# Patient Record
Sex: Female | Born: 1950
Health system: Southern US, Community
[De-identification: ages and names within clinical notes are randomized; demographics above are authoritative.]

## PROBLEM LIST (undated history)

## (undated) DIAGNOSIS — A09 Infectious gastroenteritis and colitis, unspecified: Secondary | ICD-10-CM

## (undated) DIAGNOSIS — G479 Sleep disorder, unspecified: Secondary | ICD-10-CM

## (undated) DIAGNOSIS — K644 Residual hemorrhoidal skin tags: Secondary | ICD-10-CM

## (undated) DIAGNOSIS — R1904 Left lower quadrant abdominal swelling, mass and lump: Secondary | ICD-10-CM

## (undated) DIAGNOSIS — M349 Systemic sclerosis, unspecified: Secondary | ICD-10-CM

## (undated) DIAGNOSIS — M7150 Other bursitis, not elsewhere classified, unspecified site: Secondary | ICD-10-CM

## (undated) DIAGNOSIS — R0789 Other chest pain: Secondary | ICD-10-CM

## (undated) DIAGNOSIS — R040 Epistaxis: Secondary | ICD-10-CM

## (undated) DIAGNOSIS — K429 Umbilical hernia without obstruction or gangrene: Secondary | ICD-10-CM

## (undated) DIAGNOSIS — D249 Benign neoplasm of unspecified breast: Secondary | ICD-10-CM

## (undated) HISTORY — DX: Sleep disorder, unspecified: G47.9

## (undated) HISTORY — DX: Left lower quadrant abdominal swelling, mass and lump: R19.04

## (undated) HISTORY — DX: Residual hemorrhoidal skin tags: K64.4

## (undated) HISTORY — DX: Benign neoplasm of unspecified breast: D24.9

## (undated) HISTORY — DX: Umbilical hernia without obstruction or gangrene: K42.9

## (undated) HISTORY — DX: Epistaxis: R04.0

## (undated) HISTORY — DX: Systemic sclerosis, unspecified: M34.9

## (undated) HISTORY — DX: Infectious gastroenteritis and colitis, unspecified: A09

## (undated) HISTORY — PX: COLONOSCOPY: SHX174

## (undated) HISTORY — PX: HYSTEROSCOPY: SHX211

## (undated) HISTORY — DX: Other bursitis, not elsewhere classified, unspecified site: M71.50

## (undated) HISTORY — DX: Other chest pain: R07.89

---

## 1986-11-20 HISTORY — PX: BREAST BIOPSY: SHX20

## 1986-11-20 HISTORY — PX: BREAST EXCISIONAL BIOPSY: SUR124

## 1993-11-20 HISTORY — PX: HYSTEROSCOPY: SHX211

## 1993-11-20 HISTORY — PX: BREAST BIOPSY: SHX20

## 1999-11-21 HISTORY — PX: DILATION AND CURETTAGE OF UTERUS: SHX78

## 2004-08-30 ENCOUNTER — Ambulatory Visit: Payer: Self-pay | Admitting: General Surgery

## 2004-08-30 IMAGING — MG MM CAD SCREENING MAMMO
1 series · 4 of 4 positions shown · non-contrast
Comparison: none

REASON FOR EXAM: SCREENING  [HOSPITAL] EMP CAT 2
COMMENTS:

[R MLO · right · 4 of 4 slices shown]
[im 1/4]
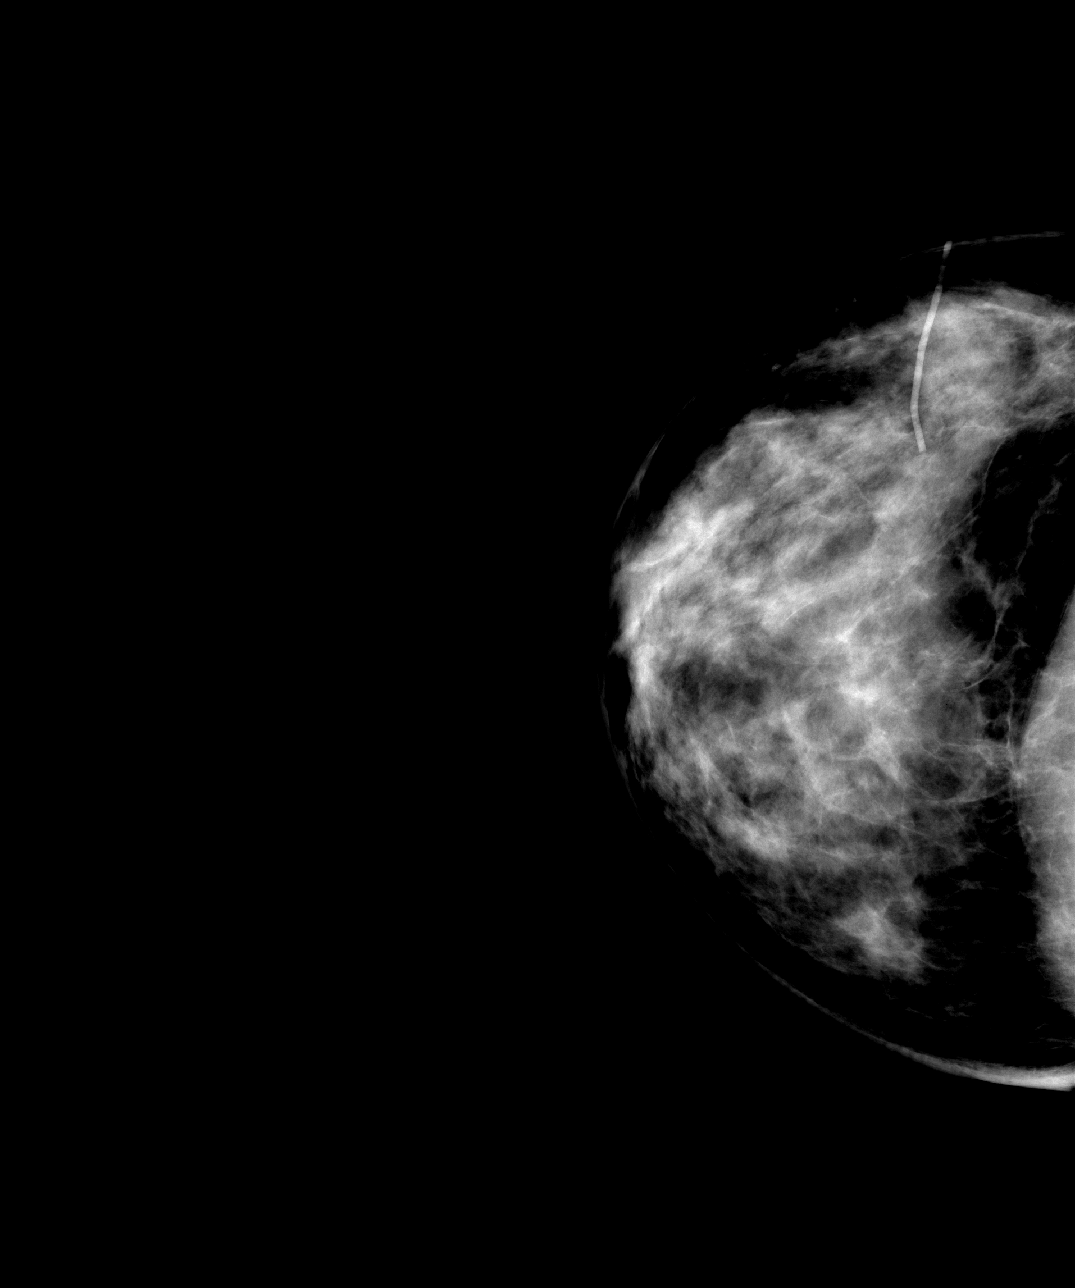
[im 2/4]
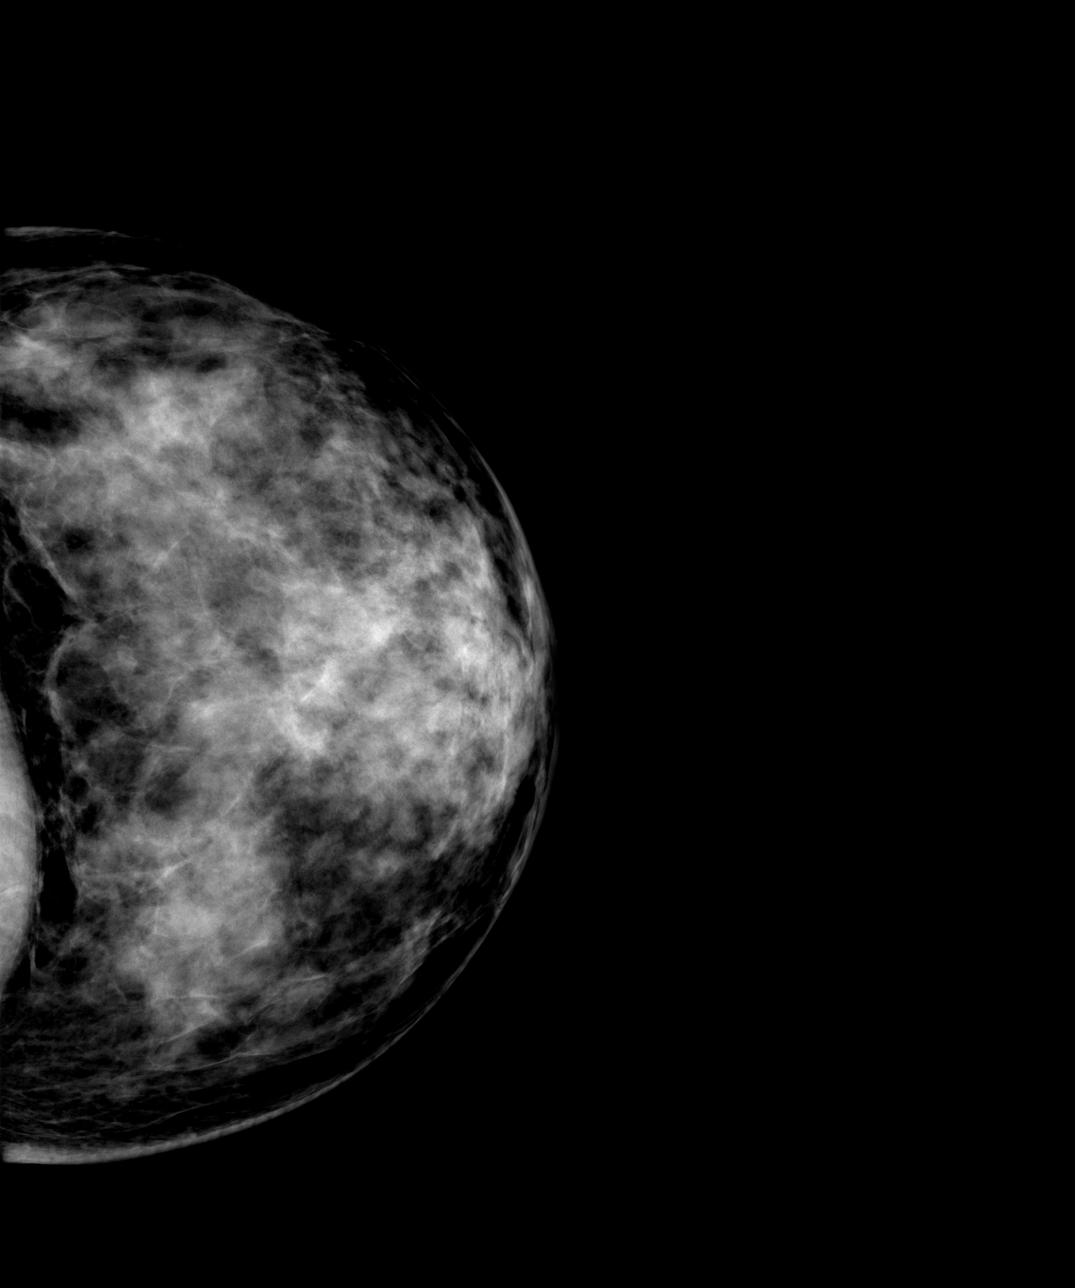
[im 3/4]
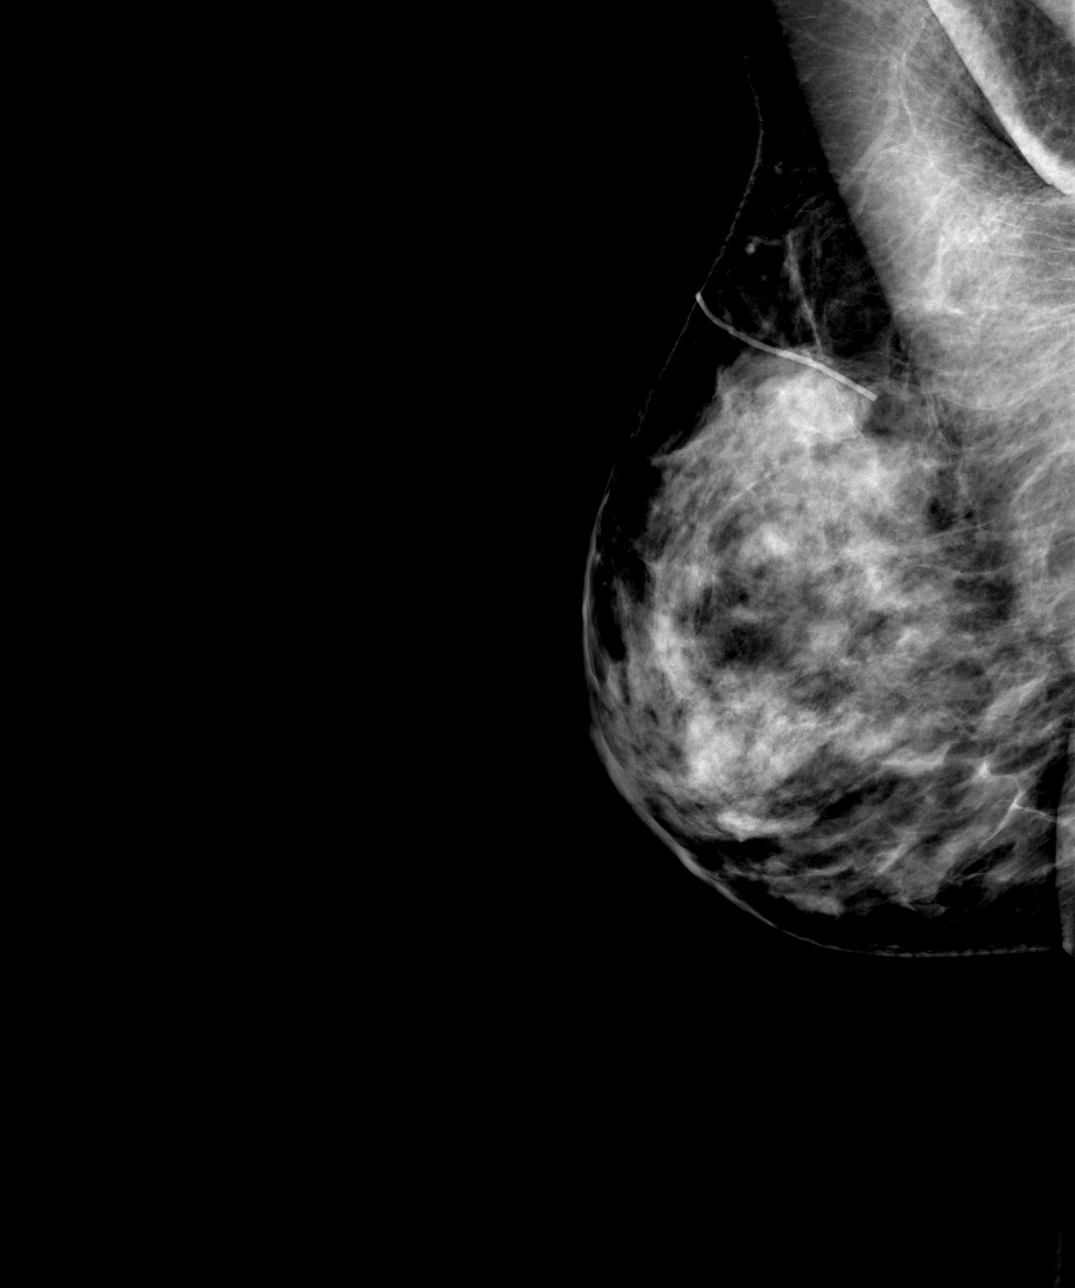
[im 4/4]
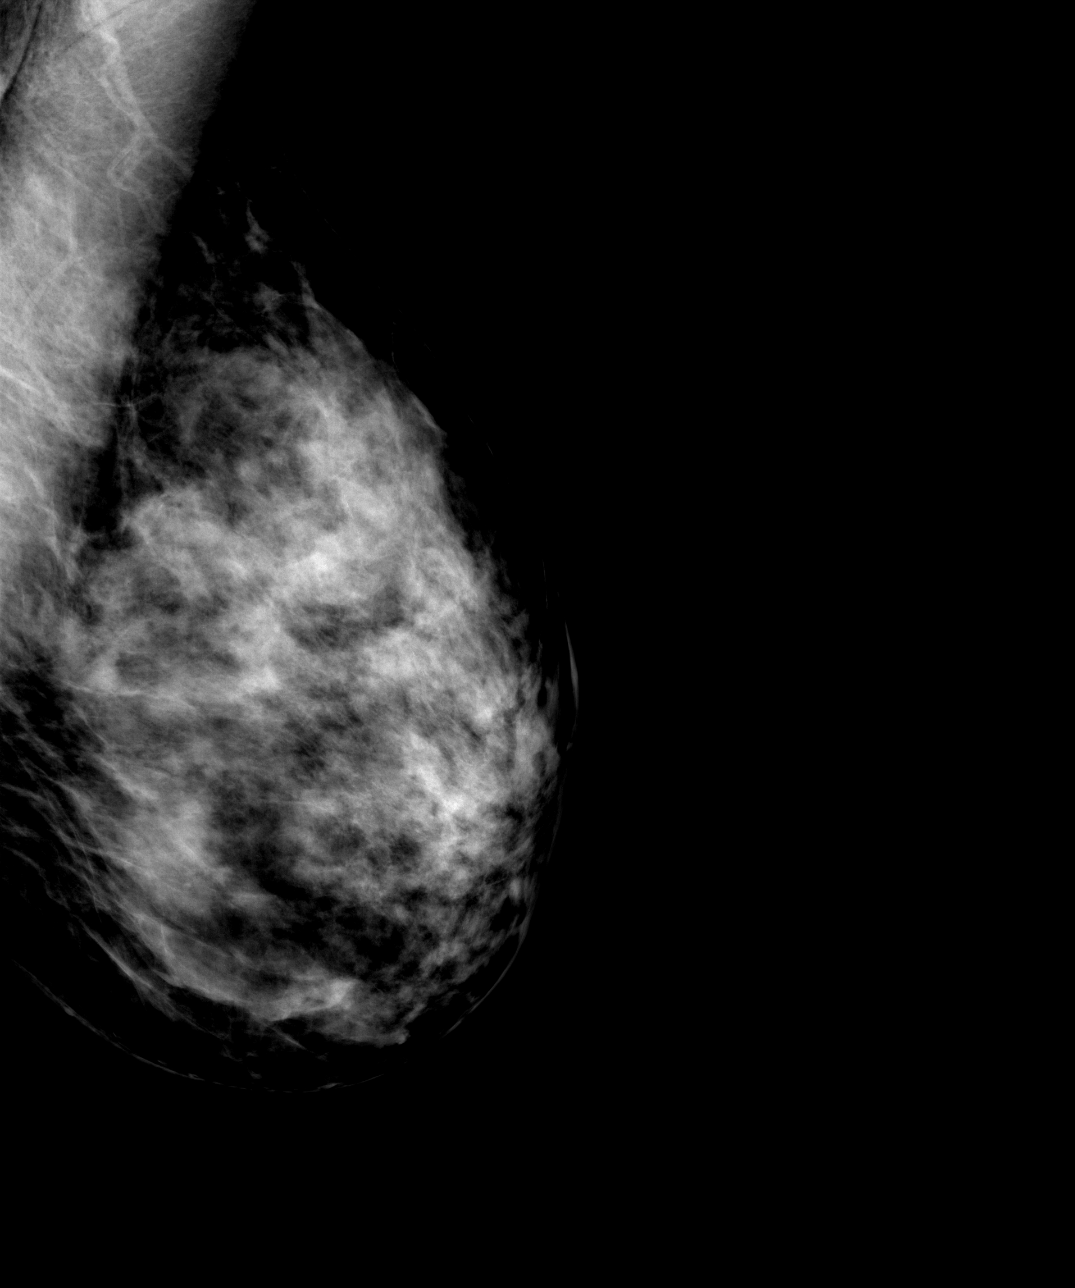

[4 of 4 positions shown; findings below may reference images not displayed]

PROCEDURE:     MAM - MAM DGTL SCREENING MAMMO W/CAD  - [DATE]  [DATE]

RESULT:     Comparison is made to the prior studies dated [DATE], [DATE],
and [DATE].  The breast demonstrates a dense fibronodular parenchymal
pattern which limits mammographic evaluation.  The region of asymmetric
density projects in the superolateral aspect of the RIGHT breast.  This is
likely a conglomeration of normal breast tissue though further evaluation
with magnification compression imaging is recommended.  Note this is in a
region of prior biopsy.  No further radiographic evidence to suggest
malignancy is appreciated.
IMPRESSION: Asymmetric density RIGHT breast.  Additional imaging recommended.

Corresponds to BI-RADS: Category 0 - Needs additional imaging evaluation.

A NEGATIVE MAMMOGRAM REPORT DOES NOT PRECLUDE BIOPSY OR OTHER EVALUATION OF
A CLINICALLY PALPABLE OR OTHERWISE SUSPICIOUS MASS OR LESION.  BREAST CANCER
MAY NOT BE DETECTED BY MAMMOGRAPHY IN UP TO 10% OF CASES.

## 2004-09-01 ENCOUNTER — Ambulatory Visit: Payer: Self-pay | Admitting: General Surgery

## 2004-09-01 IMAGING — MG MM ADDITIONAL VIEWS AT NO CHARGE
1 series · 4 of 4 positions shown · non-contrast
Comparison: none

REASON FOR EXAM: denisty IF ULS NEEDED TO NE DONE IN [REDACTED]
COMMENTS:

[R MLO · right · 4 of 4 slices shown]
[im 1/4]
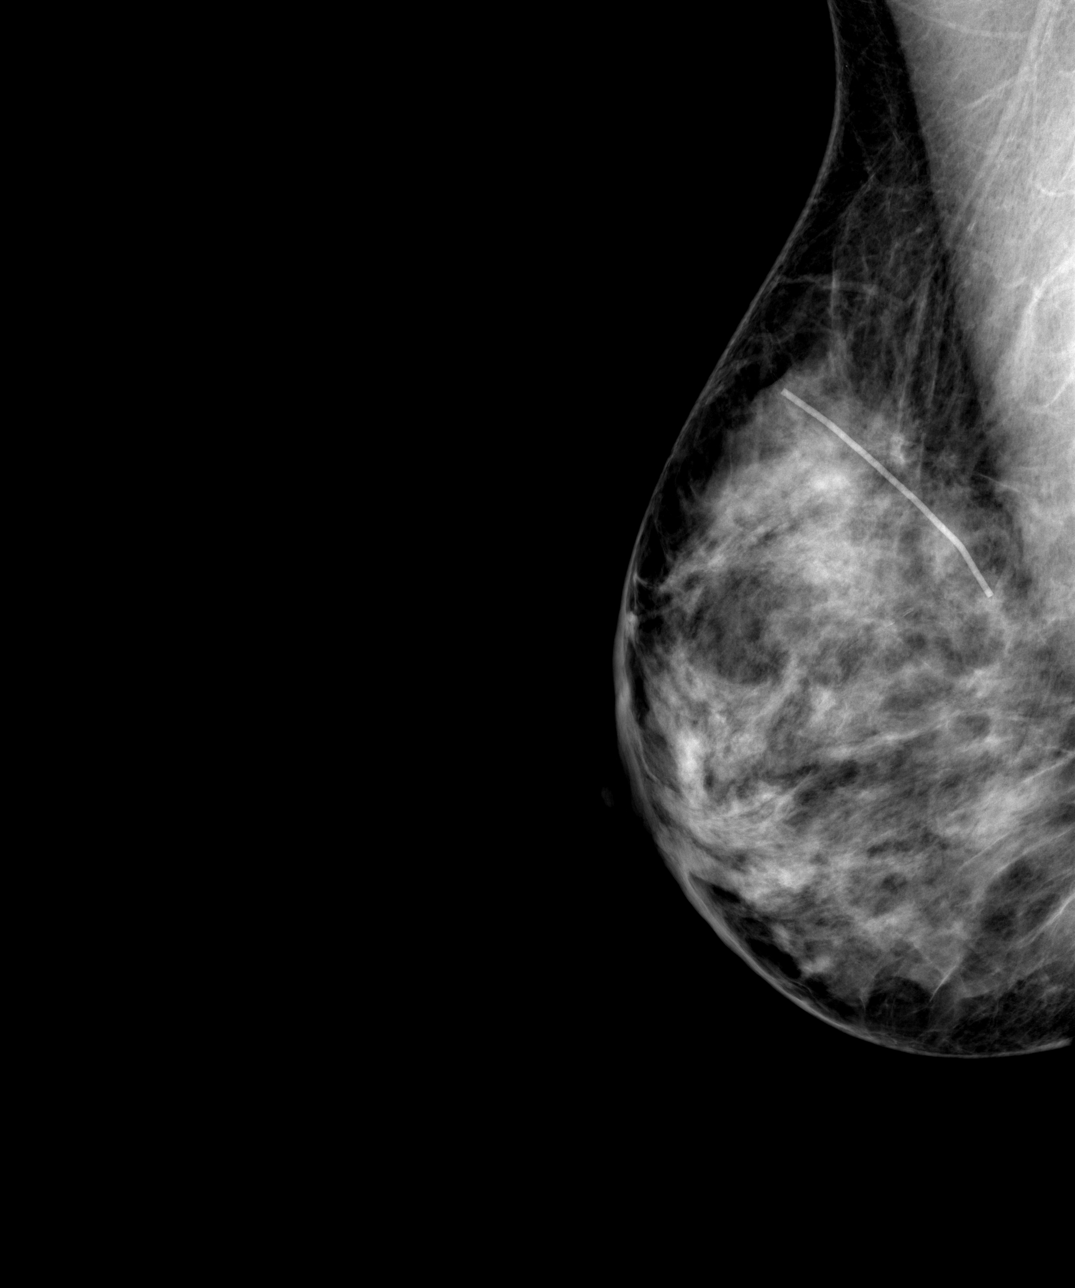
[im 2/4]
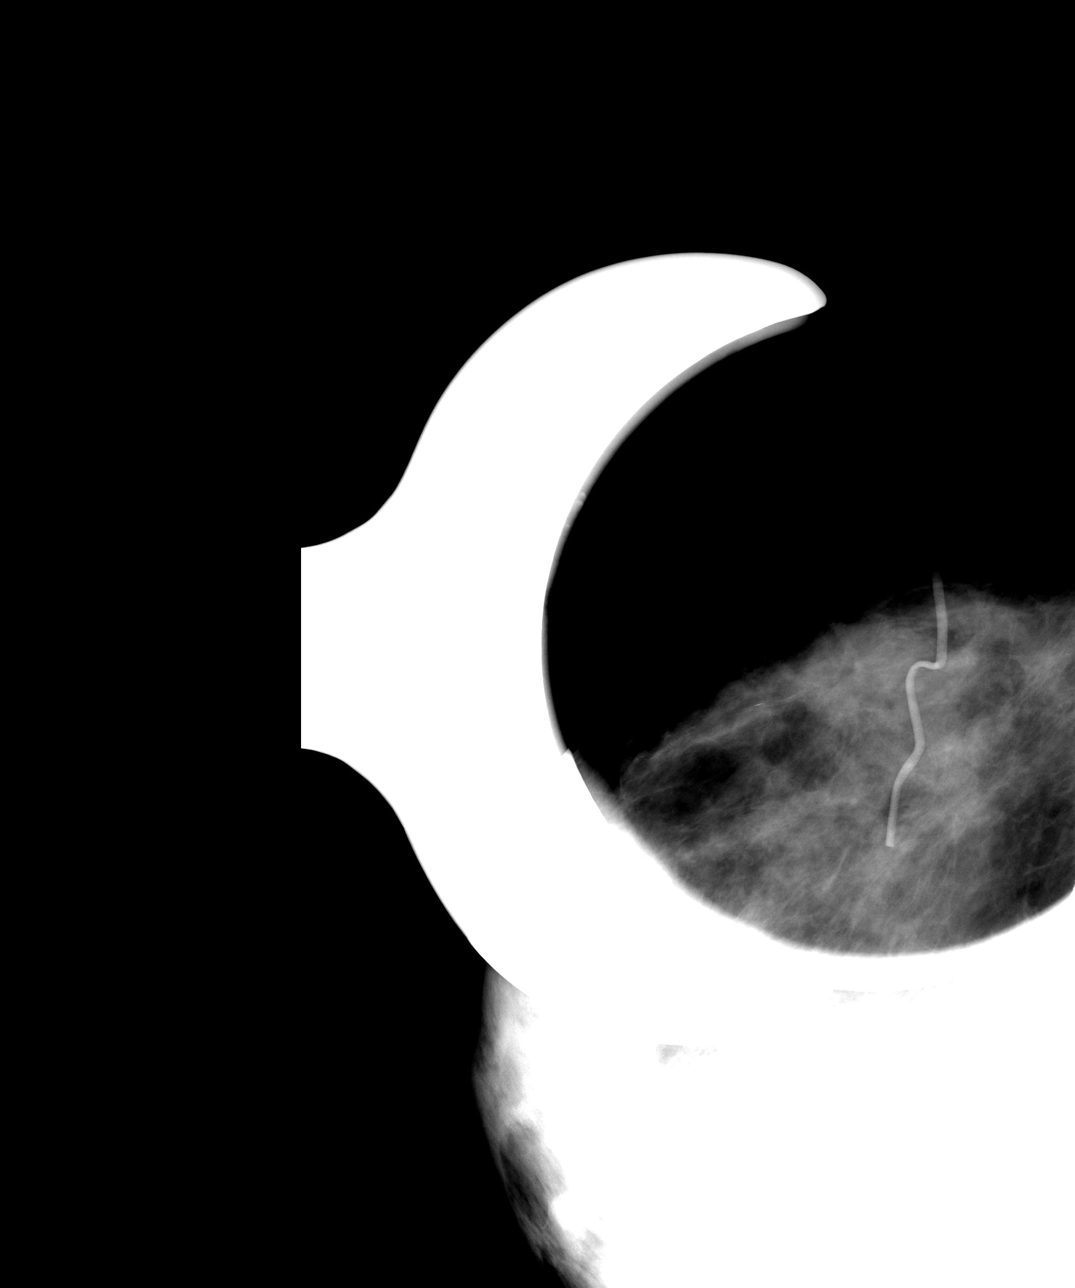
[im 3/4]
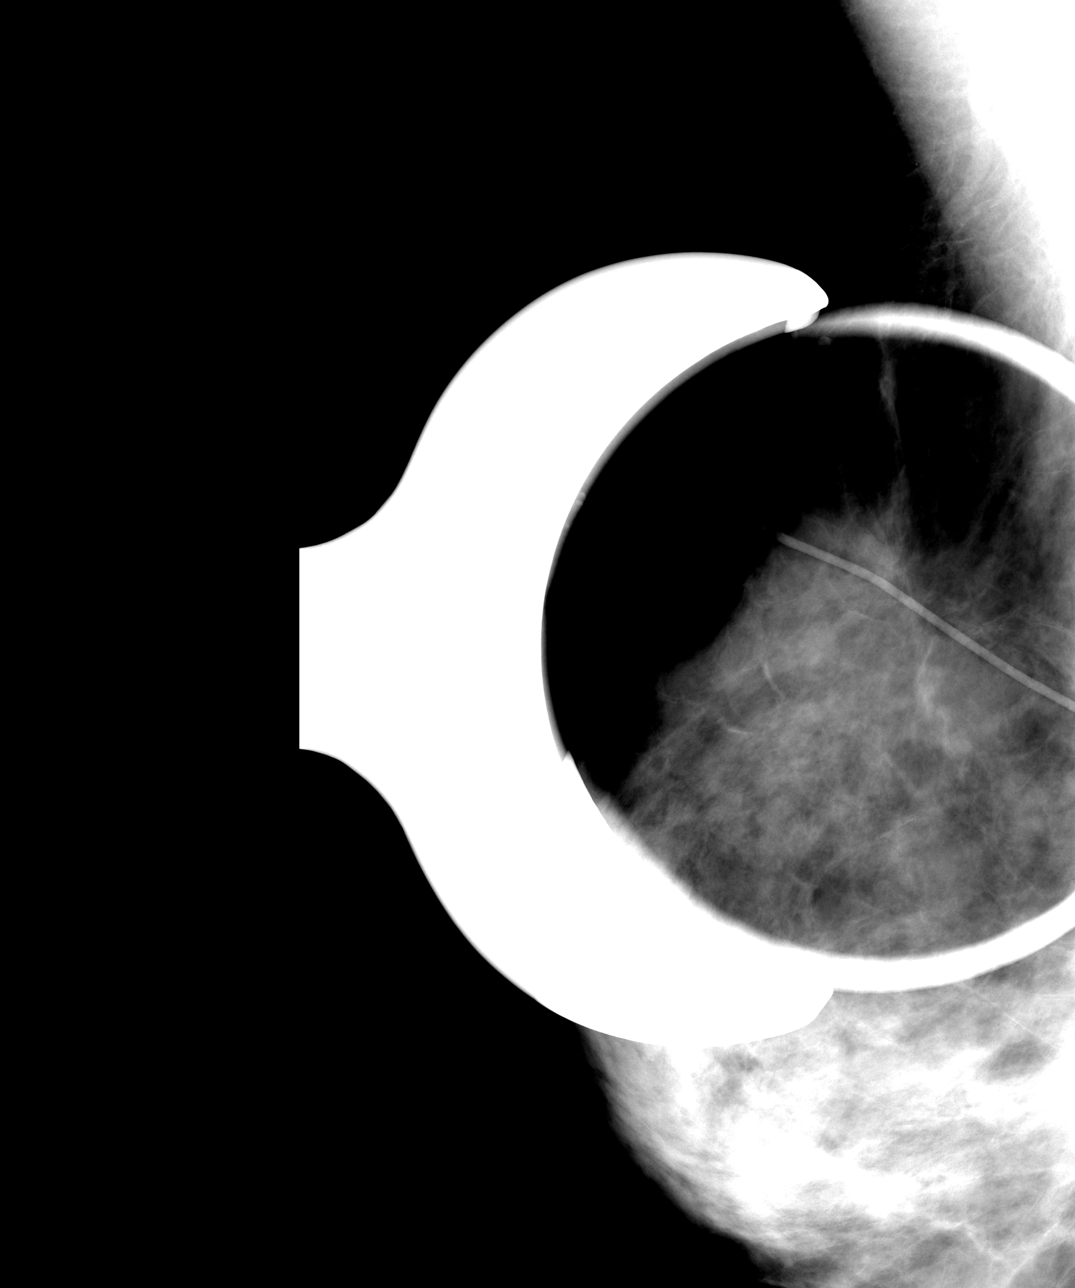
[im 4/4]
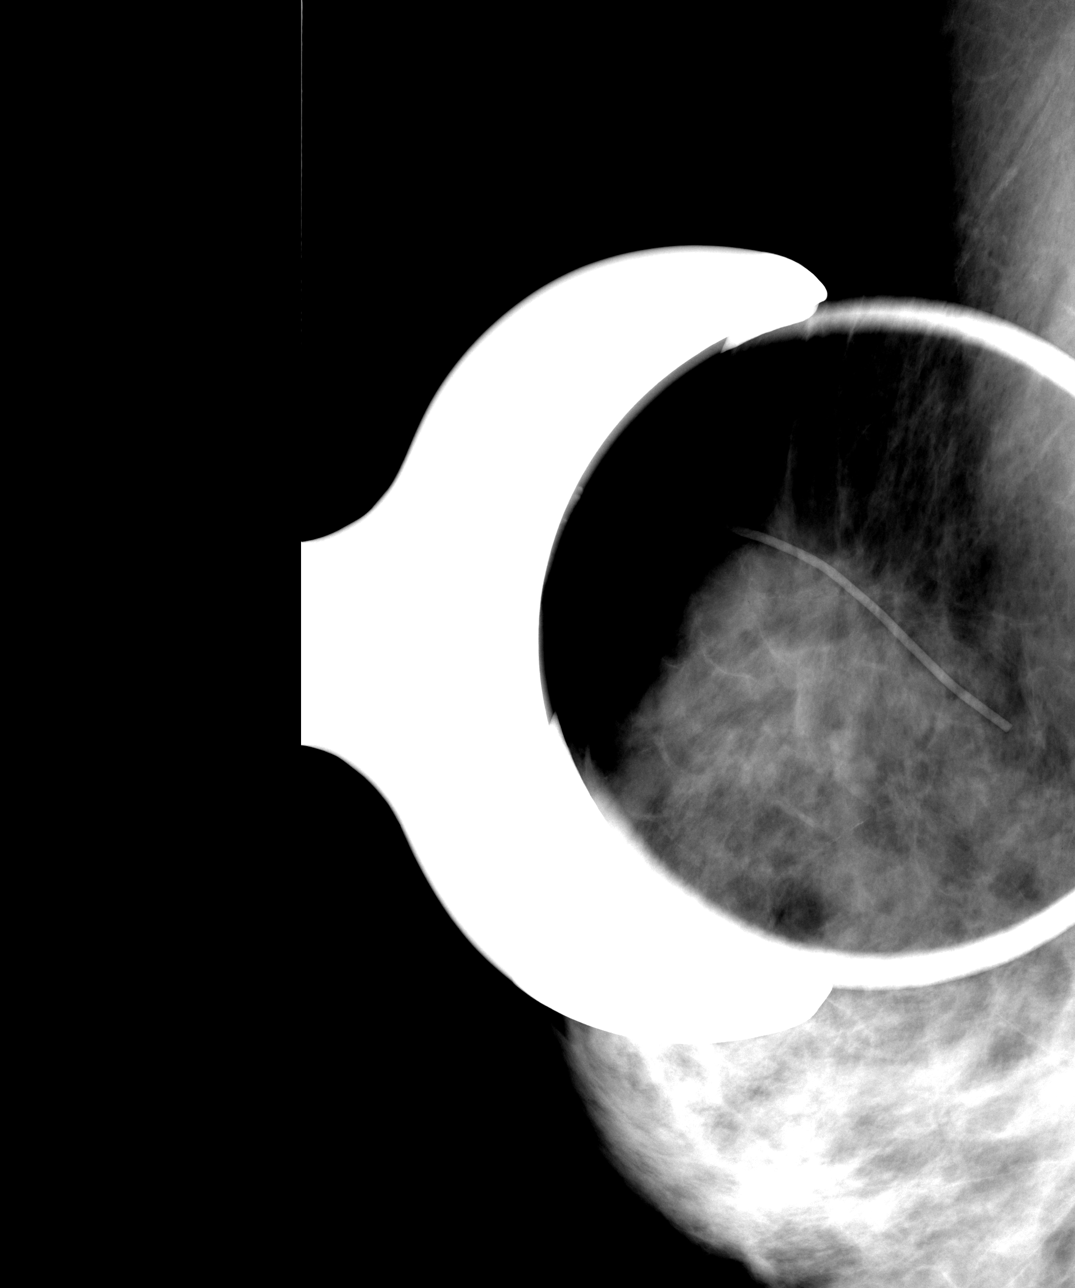

[4 of 4 positions shown; findings below may reference images not displayed]

PROCEDURE:     MAM - MAM DGTL ADD VIEWS RT  SCR  - [DATE] [DATE]

RESULT:         The area of asymmetric density described within the RIGHT
breast was further evaluated.  Magnification compression imaging
demonstrates what appears to be spreading of this area of increased density.
 The area is vaguely appreciated on the magnification CC view though it does
spread.  These are likely benign findings.  Due to the slight persistence on
the CC view, a 6-month follow up is recommended for further evaluation.
IMPRESSION: 1.     Likely benign findings, RIGHT breast.
2.     BI-RADS: Category 3-Probably Benign Finding.   6-month follow up
recommended.

A NEGATIVE MAMMOGRAM REPORT DOES NOT PRECLUDE BIOPSY OR OTHER EVALUATION OF
A CLINICALLY PALPABLE OR OTHERWISE SUSPICIOUS MASS OR LESION.   BREAST

## 2005-02-22 ENCOUNTER — Ambulatory Visit: Payer: Self-pay | Admitting: General Surgery

## 2005-02-22 IMAGING — MG MM MAMMO DIAGNOSTIC UNILATERAL*R*
1 series · 5 of 5 positions shown · non-contrast
Comparison: none

REASON FOR EXAM: Breast mass                     [HOSPITAL] emp cat 2
COMMENTS:

[R XCCL · right · 5 of 5 slices shown]
[im 1/5]
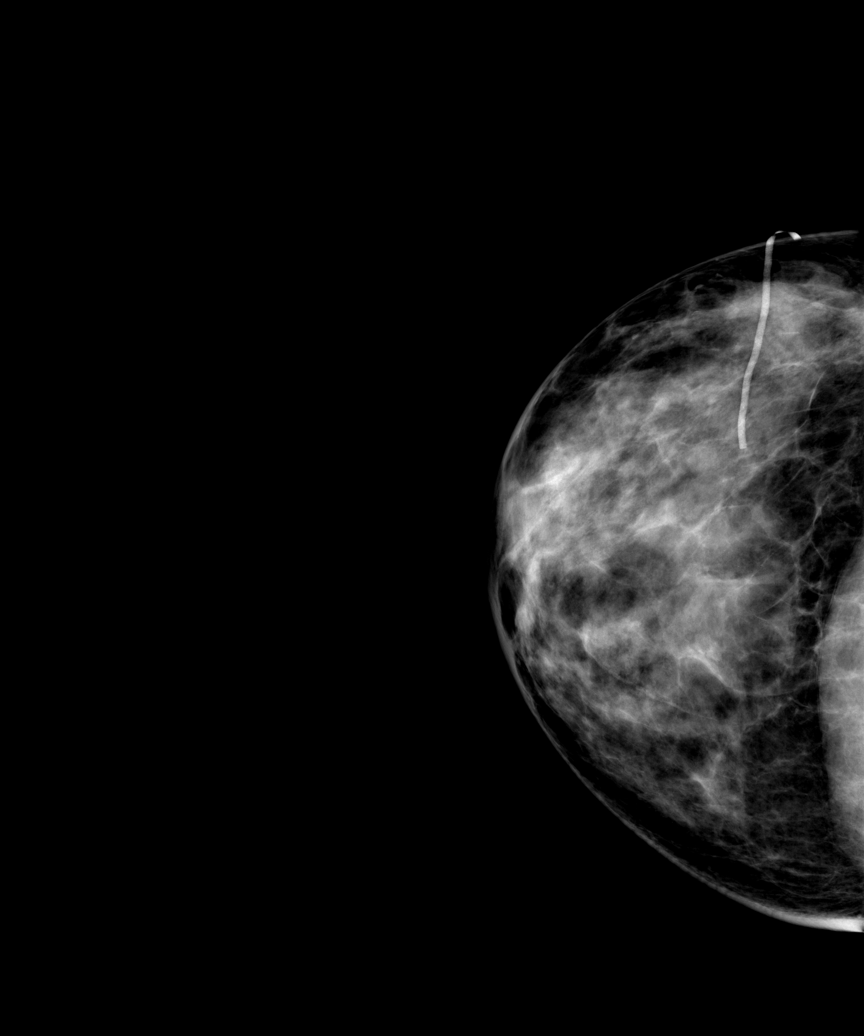
[im 2/5]
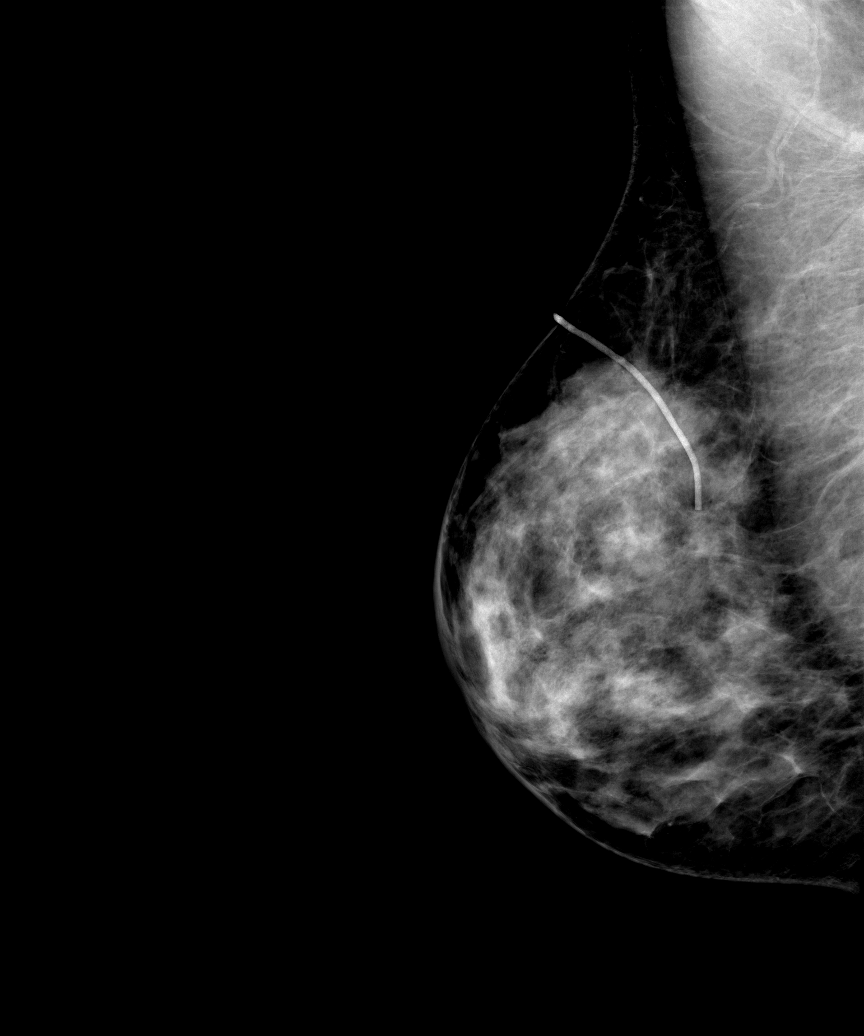
[im 3/5]
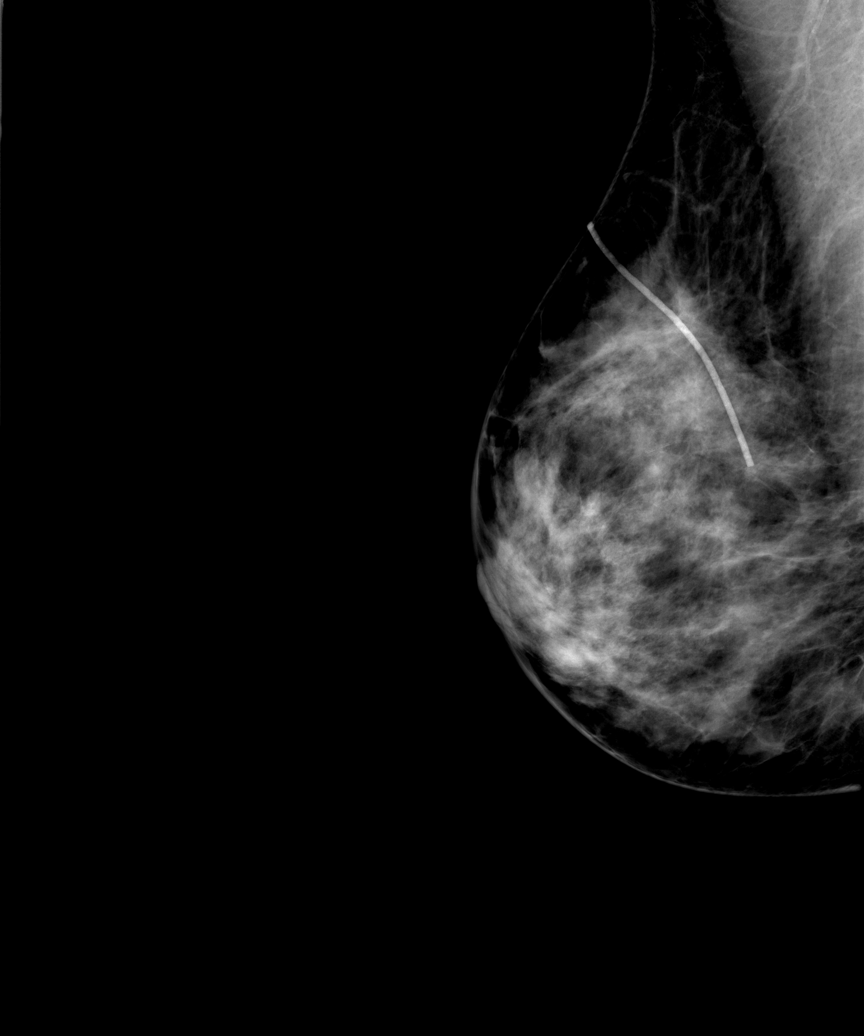
[im 4/5]
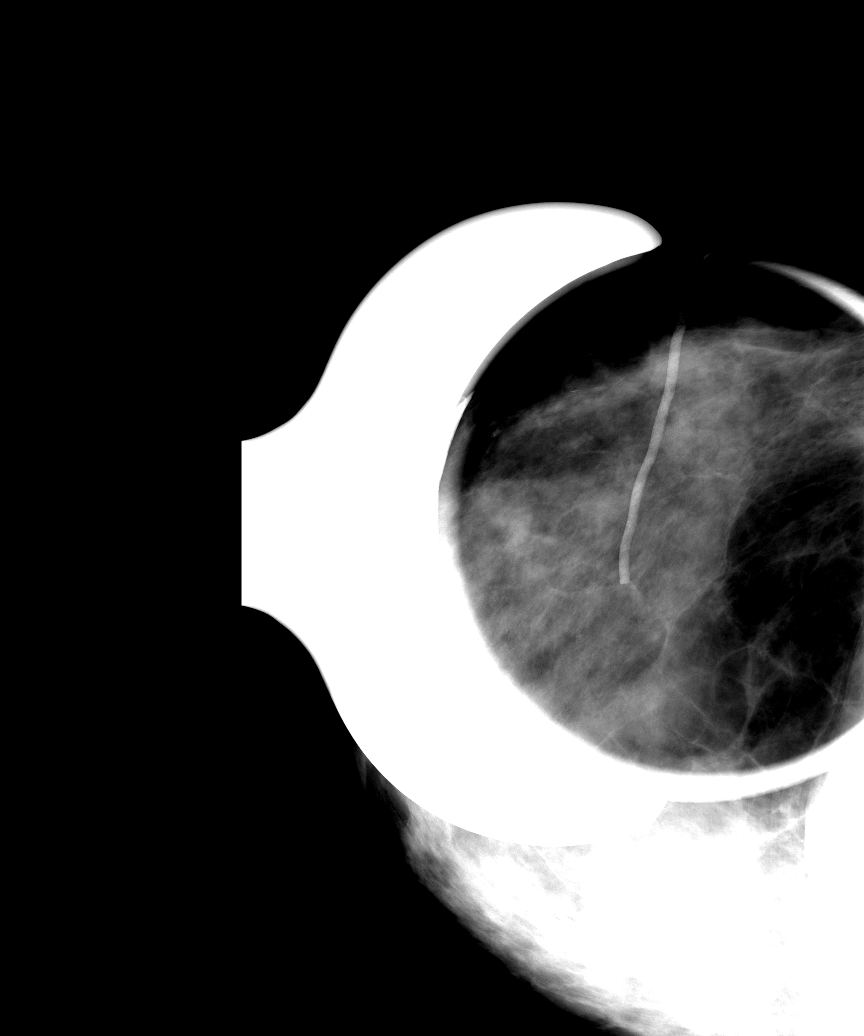
[im 5/5]
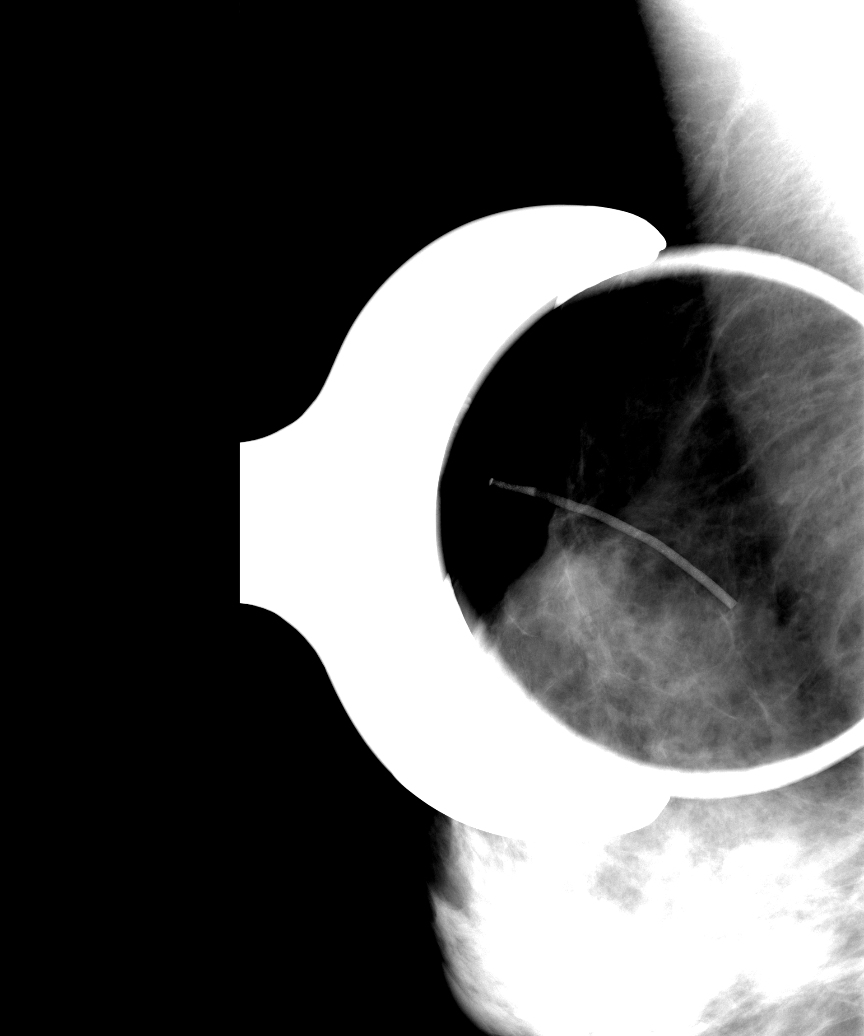

[5 of 5 positions shown; findings below may reference images not displayed]

PROCEDURE:     MAM - MAM DGTL UNI MAM RT BREAST W/CAD  - [DATE]  [DATE]

RESULT:     Comparison is made to prior studies dated [DATE] and
[DATE].

The RIGHT breast demonstrates a fibronodular, heterogeneous parenchymal
pattern. The region of asymmetric density described within the RIGHT breast
is not appreciated on the present study. Note, this area was further
evaluated with magnification compression imaging and again the area is not
appreciated. This is consistent with a benign finding indicative of a
conglomeration of normal breast tissue.
IMPRESSION: 1.     Benign findings.
2.     This corresponds to BI-RADS:  Category 2 - Benign Imaging Findings
mammogram.

A NEGATIVE MAMMOGRAM REPORT DOES NOT PRECLUDE BIOPSY OR OTHER EVALUATION OF
CLINICALLY PALPABLE OR OTHERWISE SUSPICIOUS MASS OR LESION.  BREAST CANCER
MAY NOT BE DETECTED BY MAMMOGRAPHY IN UP TO 10% OF CASES.

## 2005-09-07 ENCOUNTER — Ambulatory Visit: Payer: Self-pay | Admitting: General Surgery

## 2005-09-07 ENCOUNTER — Ambulatory Visit: Payer: Self-pay | Admitting: Internal Medicine

## 2005-09-07 IMAGING — CR DG ABDOMEN 1V
1 series · 2 of 2 positions shown · non-contrast
Comparison: none

REASON FOR EXAM: Chronic constipation
COMMENTS:

[Series 1: view not recorded · 0.17mm/px · 2 of 2 slices shown]
[im 1/2]
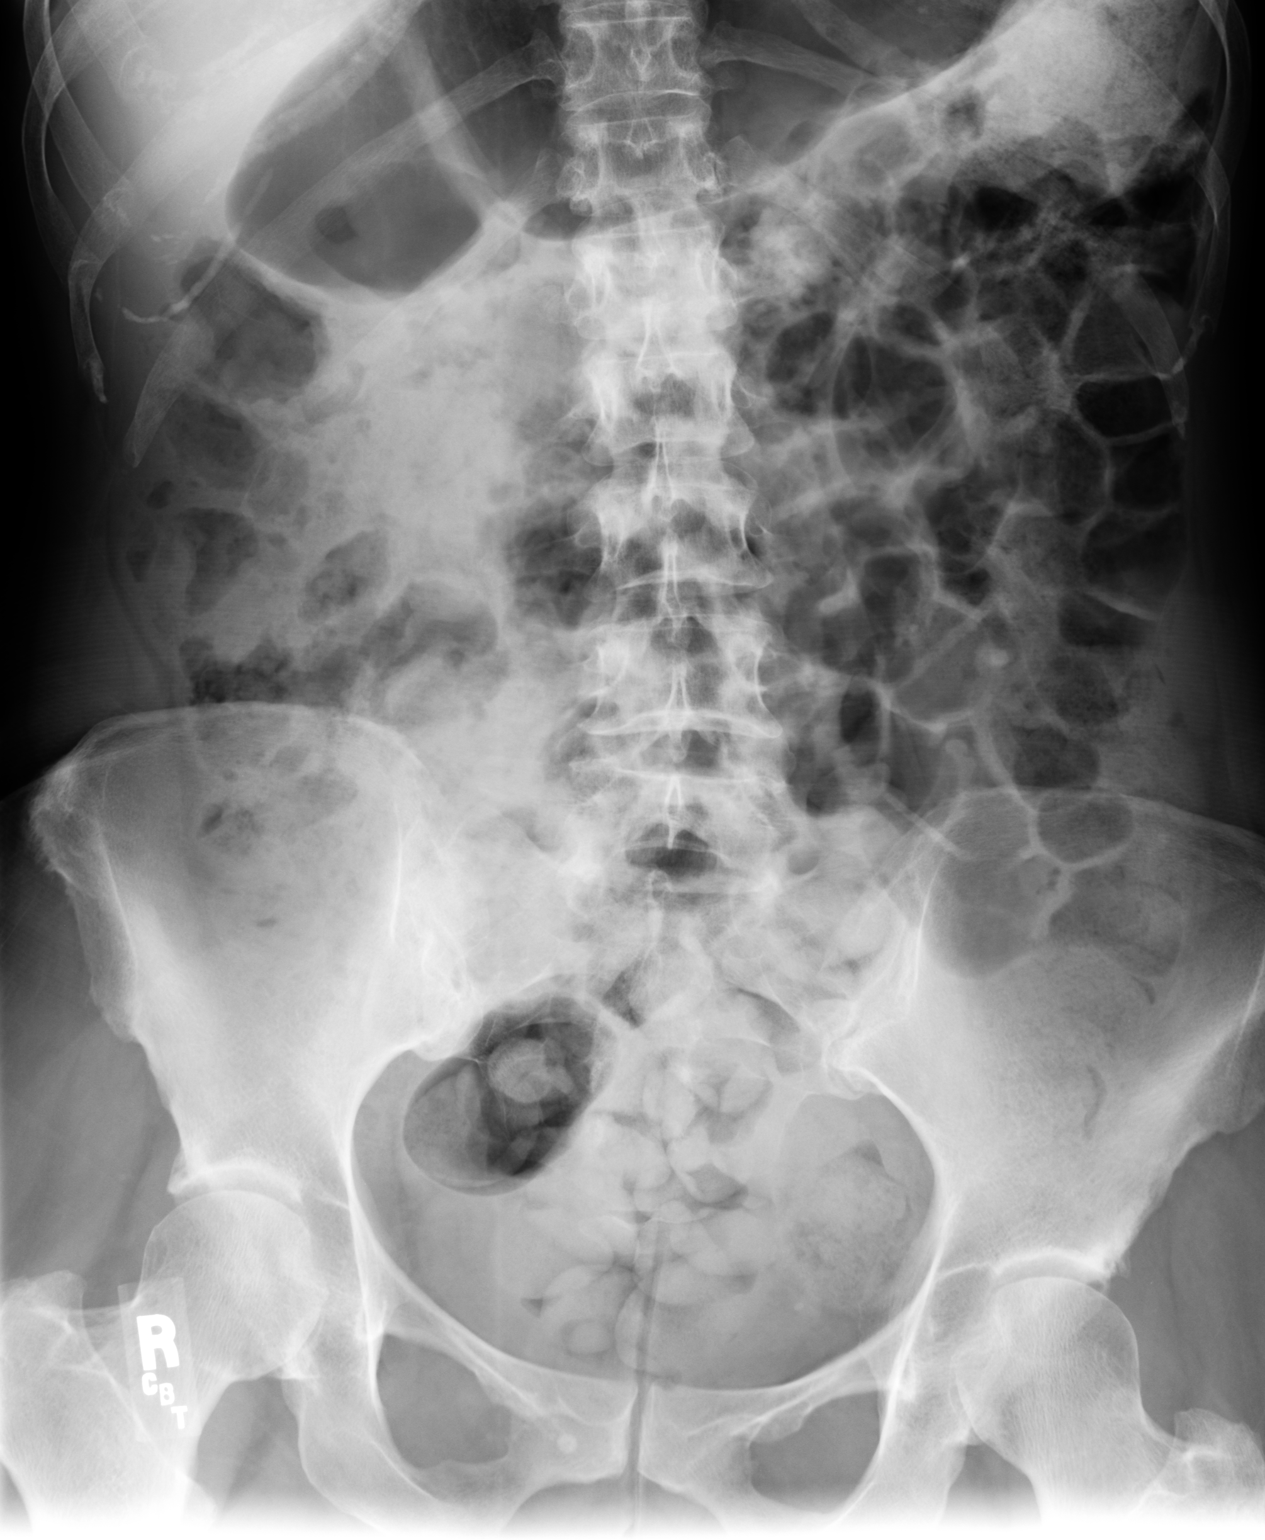
[im 2/2]
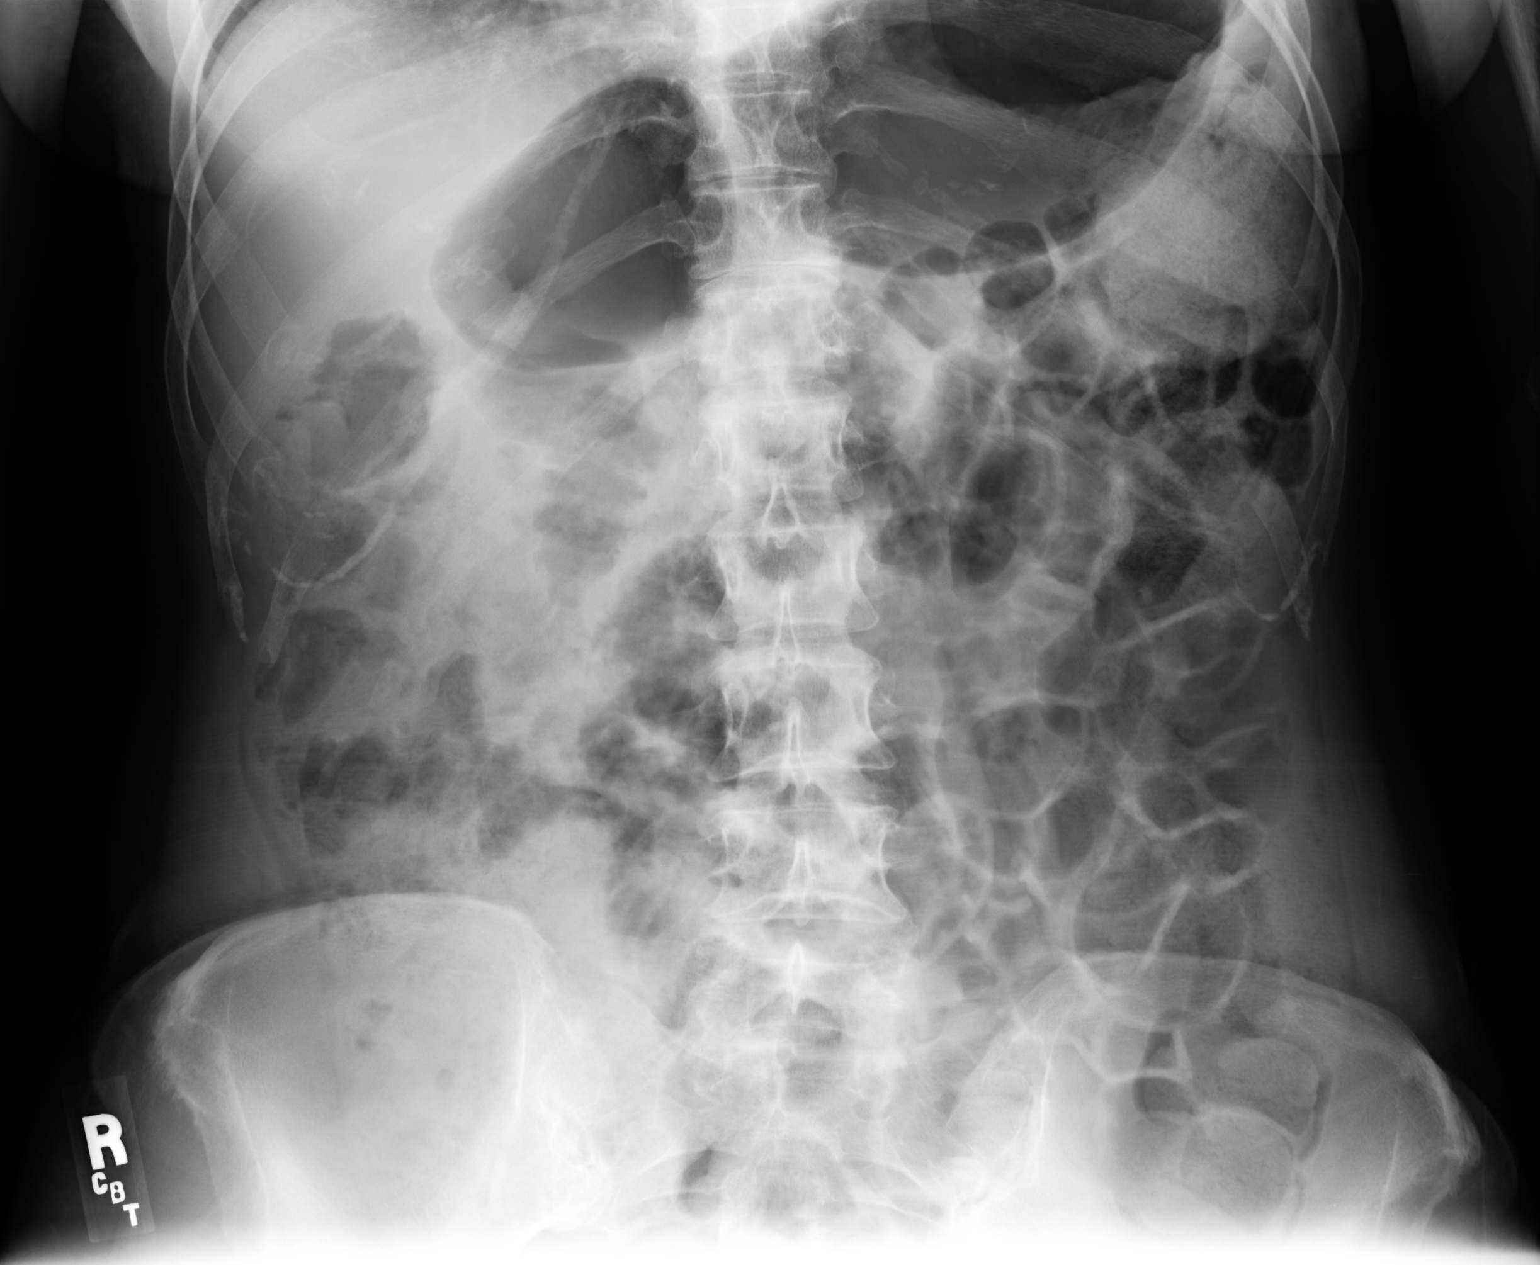

[2 of 2 positions shown; findings below may reference images not displayed]

PROCEDURE:     DXR - DXR KIDNEY URETER BLADDER  - [DATE]  [DATE]

RESULT:       A single view of the abdomen demonstrates air and stool
scattered through the colon down to the rectum.  There is a moderate amount
of air within loops of small bowel.  The appearance suggest constipation.  A
definite small bowel obstruction cannot be visualized but the bowel gas
pattern is nonspecific.  There appears to be gastric distention.
IMPRESSION: 1.     Constipation.
2.     Moderately large amount of air within the stomach and some moderate
distention of loops of small bowel.  The appearance is nonspecific but close
continued clinical follow up would be recommended.  If further imaging is
necessary, then CT would be suggested.

## 2005-09-07 IMAGING — MG MM CAD SCREENING MAMMO
1 series · 4 of 4 positions shown · non-contrast
Comparison: none

REASON FOR EXAM: screening mammo  CAT 2
COMMENTS:

PROCEDURE:     MAM - MAM DGTL SCREENING MAMMO W/CAD  - [DATE]  [DATE]
RESULT:     No dominant masses or pathologic clustered calcifications are
demonstrated.  A nodular parenchymal pattern is present.  No new
abnormalities are identified.  CAD evaluation is benign.

[R CC · right · 4 of 4 slices shown]
[im 1/4]
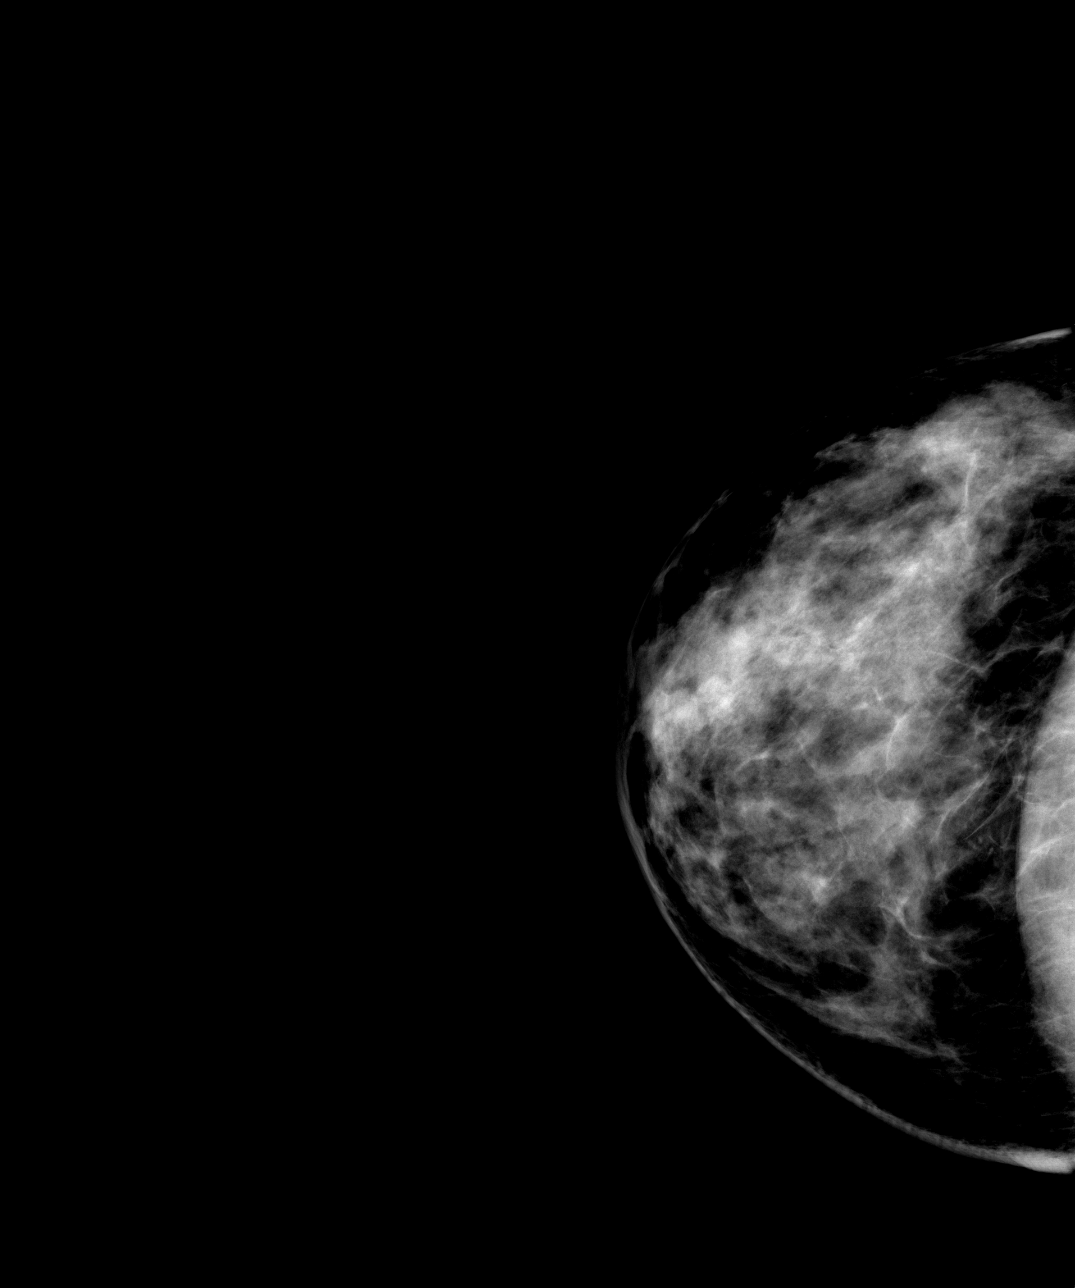
[im 2/4]
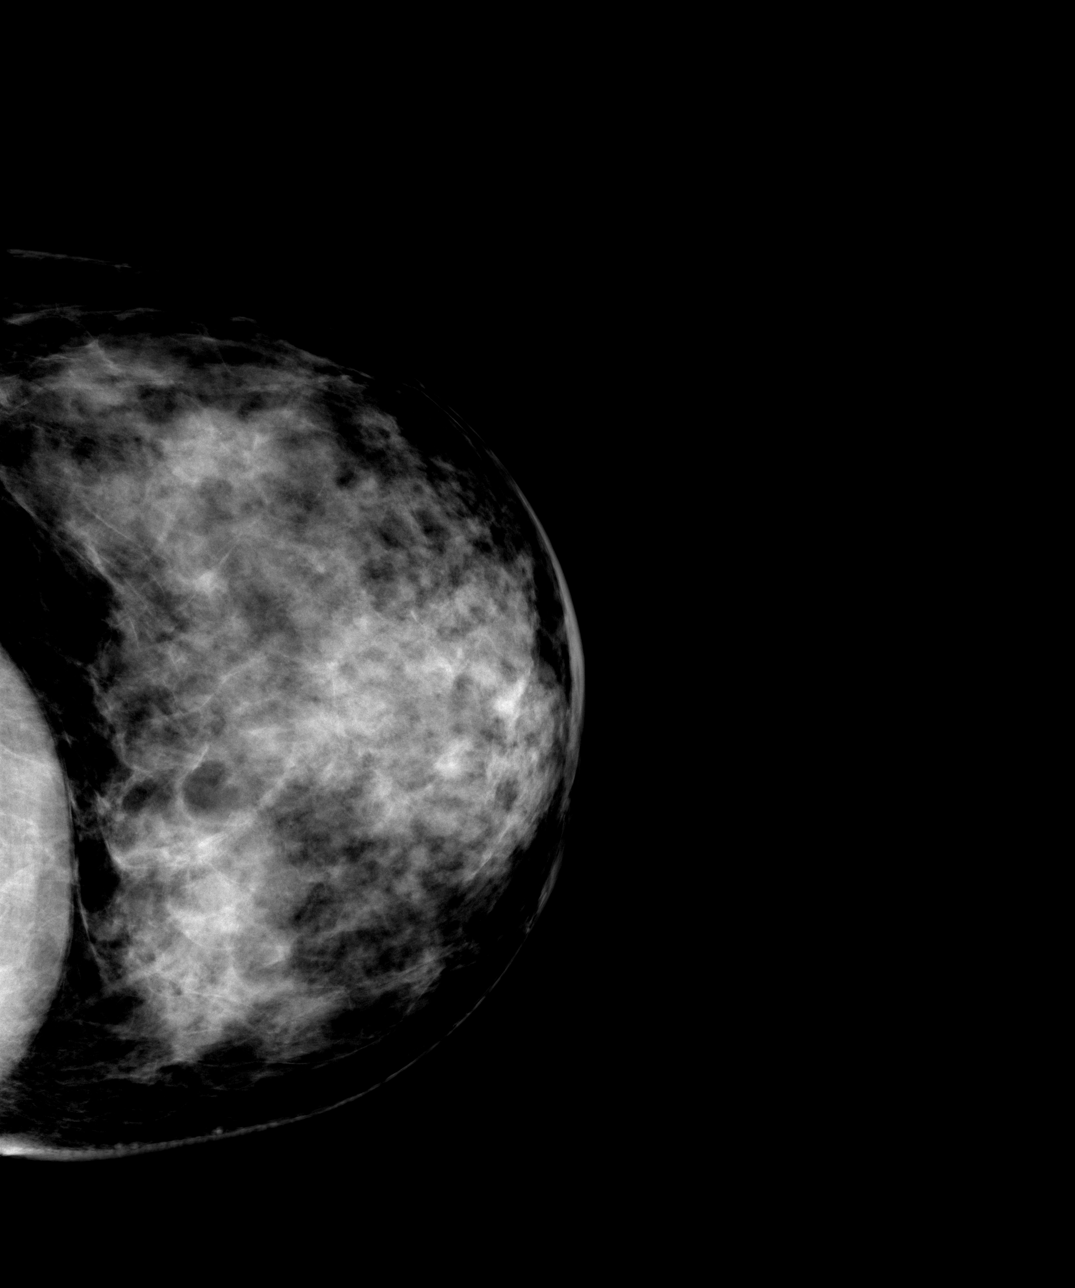
[im 3/4]
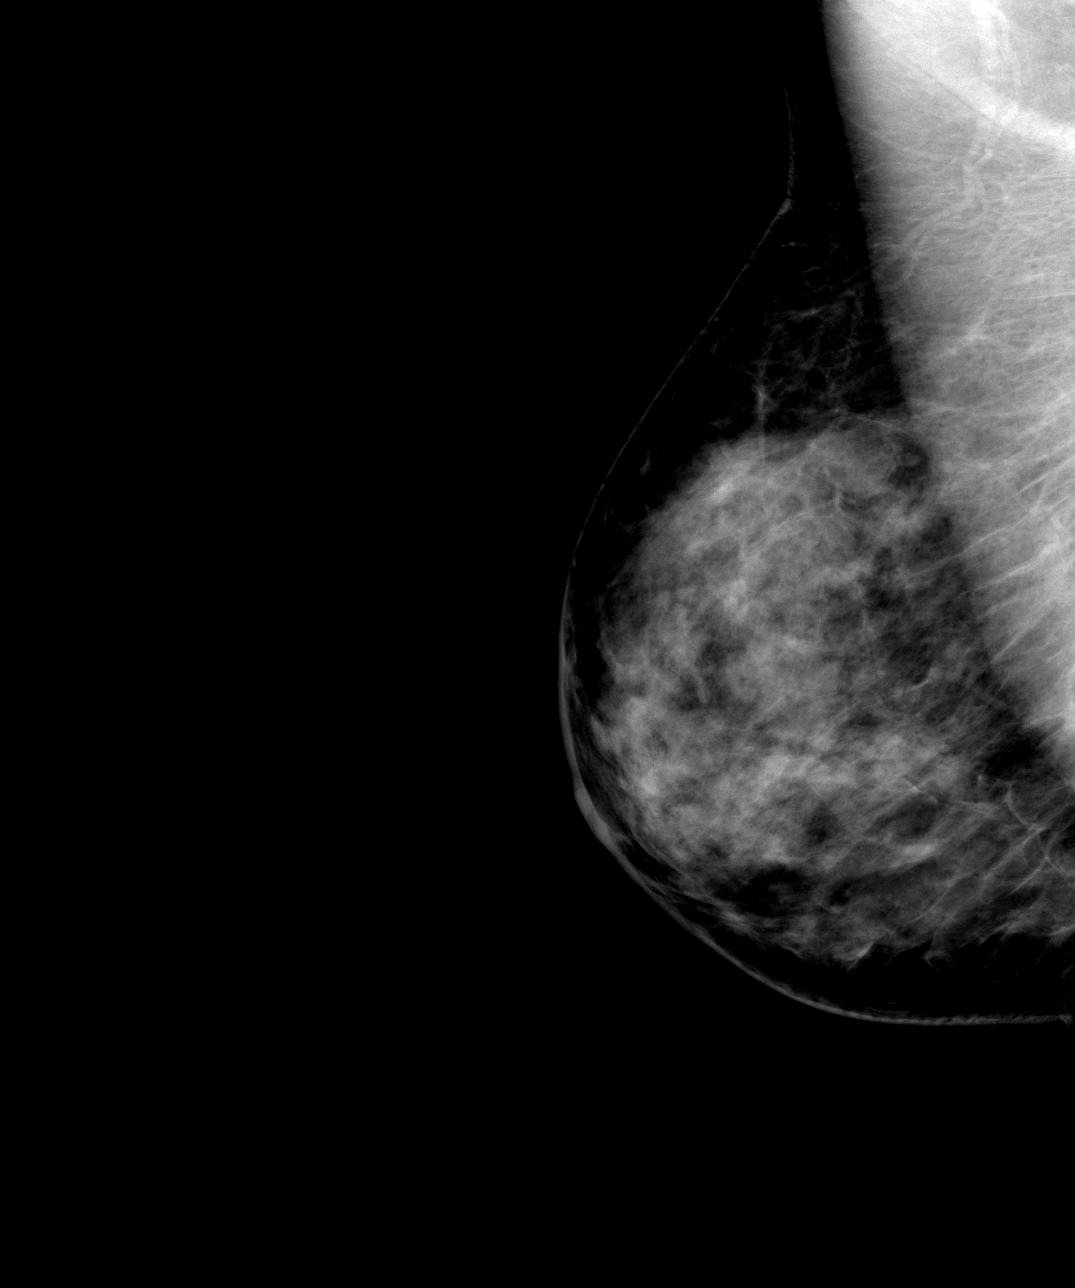
[im 4/4]
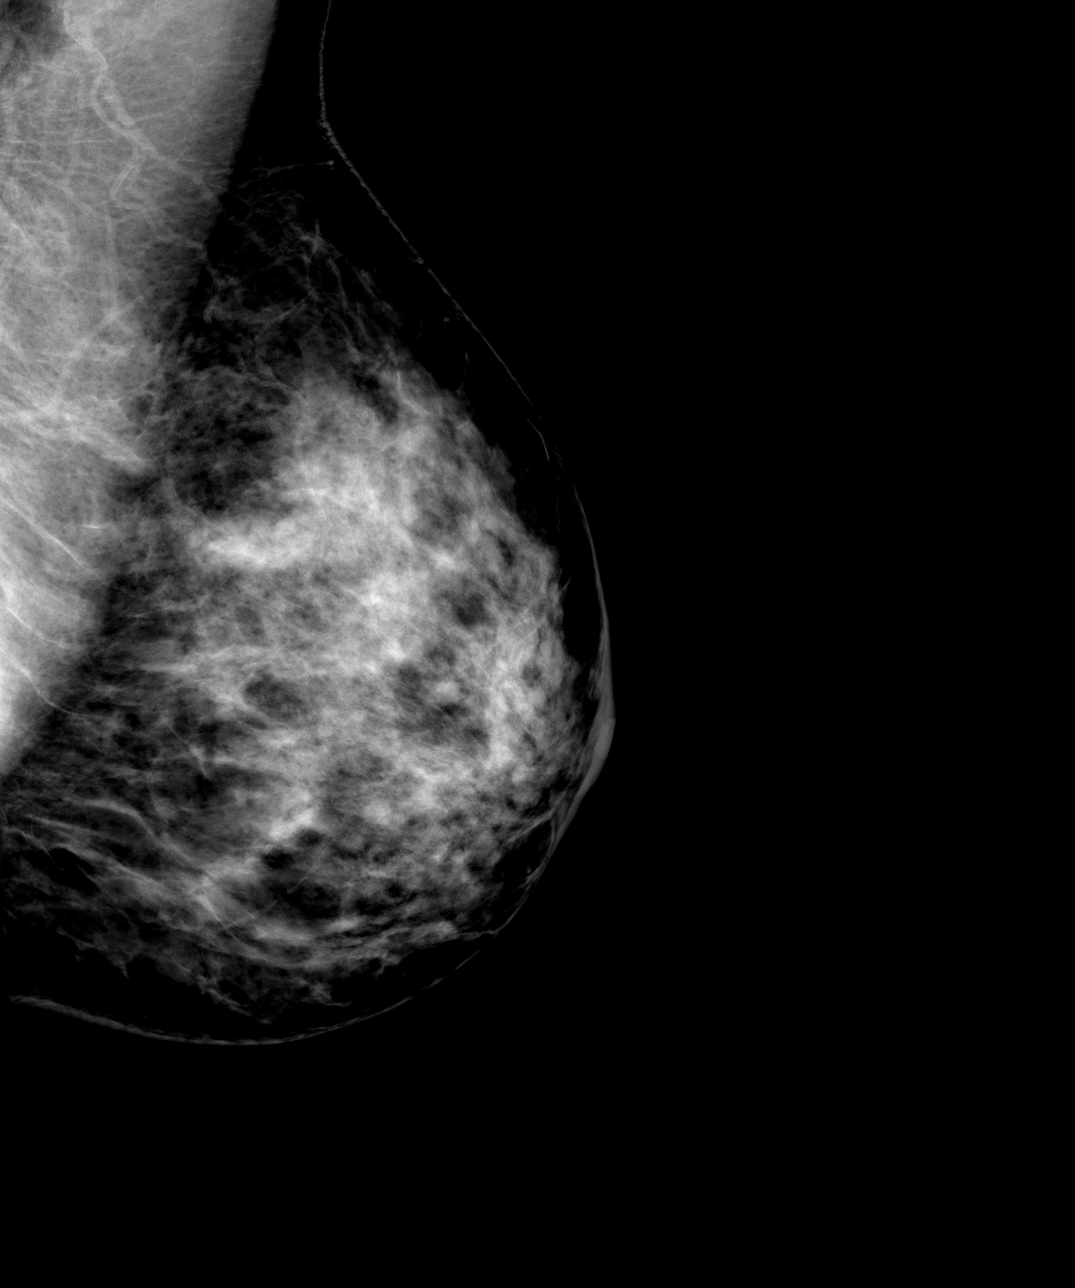

[4 of 4 positions shown; findings below may reference images not displayed]

IMPRESSION: Stable benign exam.  Yearly follow-up mammogram is suggested.

BI-RADS: Category 2 - Benign Finding.

A NEGATIVE MAMMOGRAM REPORT DOES NOT PRECLUDE BIOPSY OR OTHER EVALUATION OF
A CLINICALLY PALPABLE OR OTHERWISE SUSPICIOUS MASS OR LESION.  BREAST CANCER
MAY NOT BE DETECTED BY MAMMOGRAPHY IN UP TO 10% OF CASES.

## 2006-09-11 ENCOUNTER — Ambulatory Visit: Payer: Self-pay | Admitting: General Surgery

## 2006-09-11 IMAGING — MG MM CAD SCREENING MAMMO
1 series · 4 of 4 positions shown · non-contrast
Comparison: none

REASON FOR EXAM: Screening mammo  CAT 2
COMMENTS:

PROCEDURE:     MAM - MAM DGTL SCREENING MAMMO W/CAD  - [DATE]  [DATE]
RESULT:        Nodular parenchymal pattern is present.  No new abnormalities
are identified.  Changes consistent with scarring noted in the upper outer
aspect of the RIGHT breast.

[Series 9731: R CC · right · 4 of 4 slices shown]
[im 1/4]
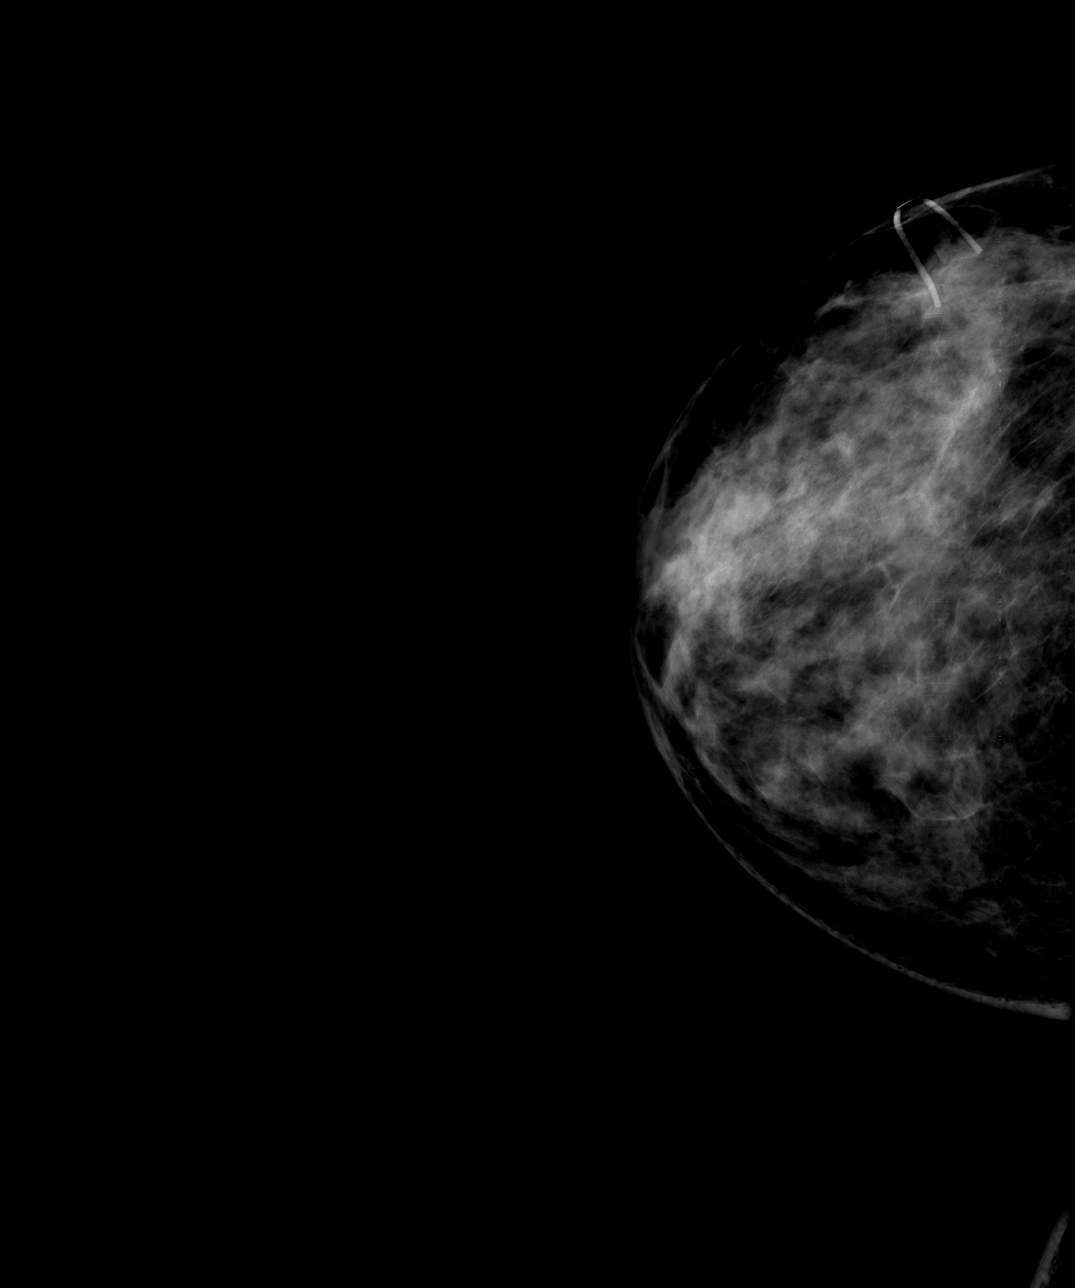
[im 2/4]
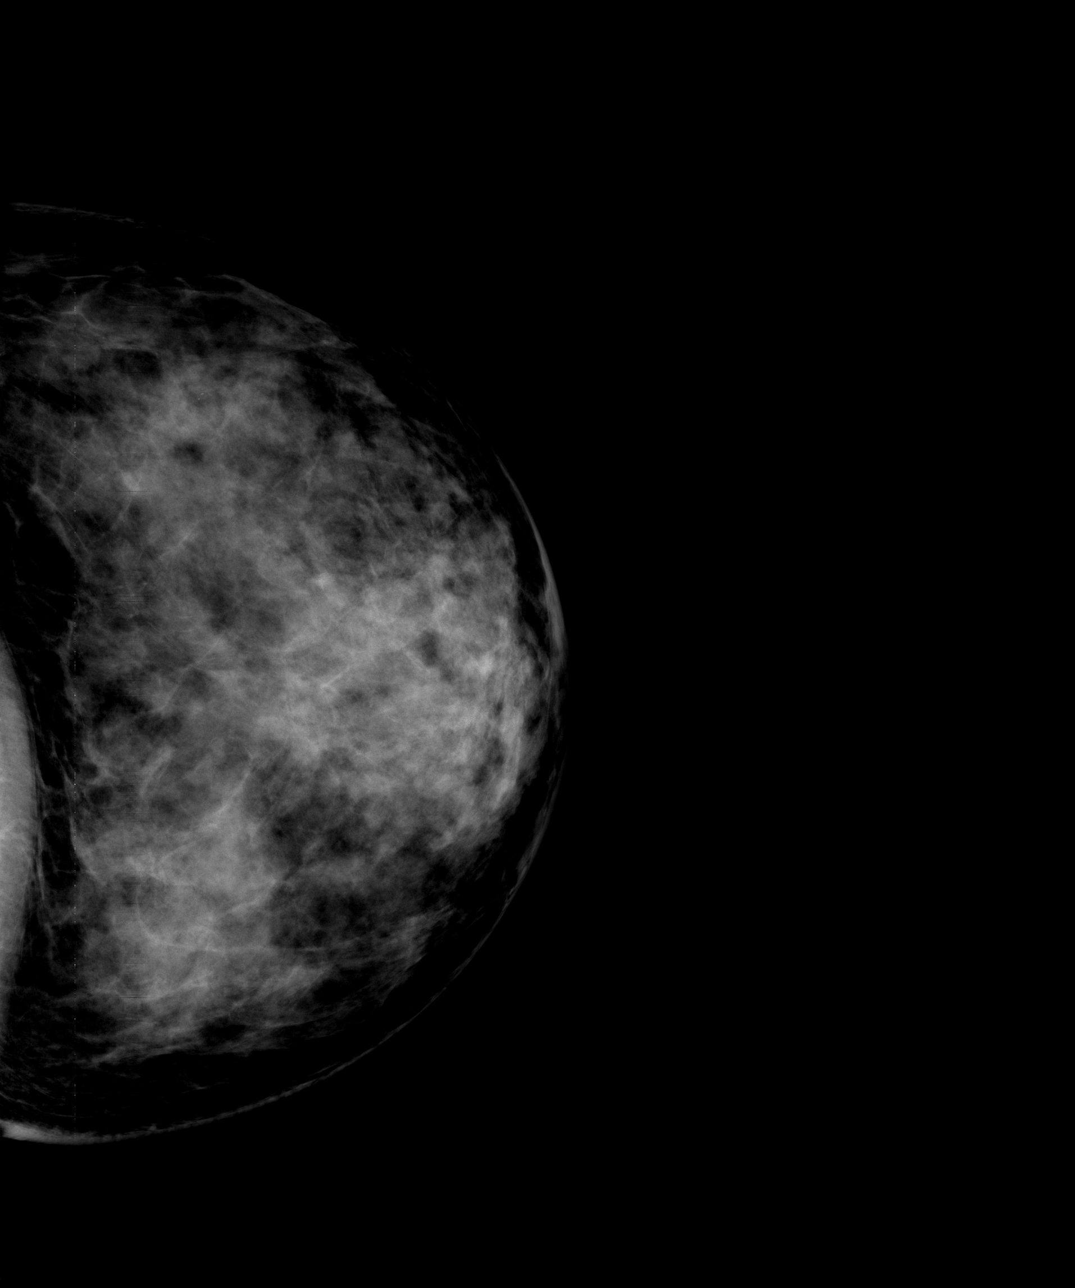
[im 3/4]
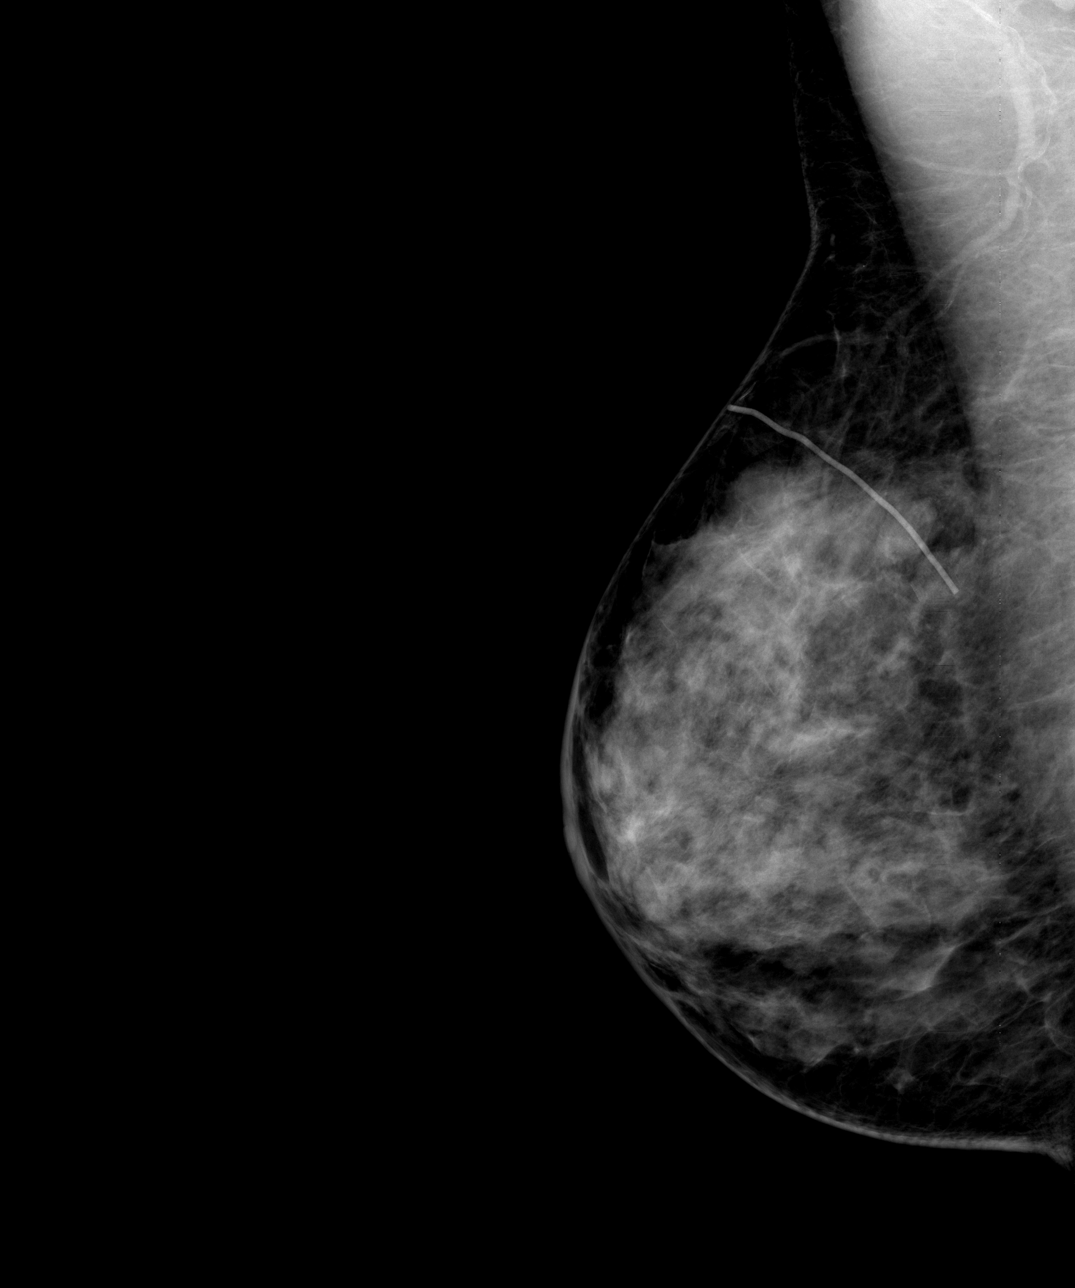
[im 4/4]
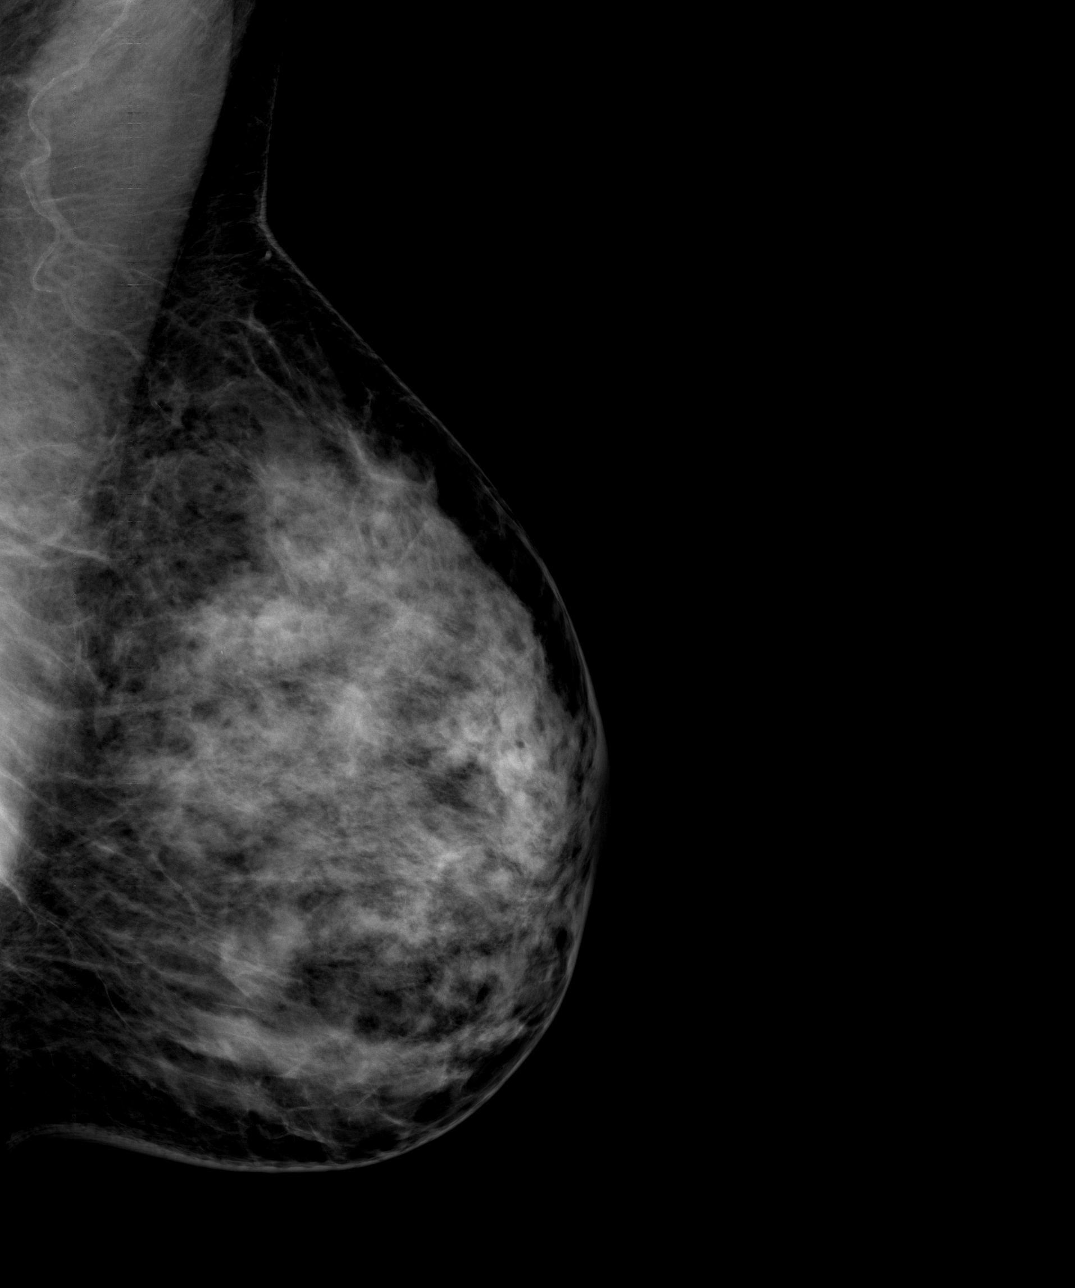

[4 of 4 positions shown; findings below may reference images not displayed]

IMPRESSION: 1.     Stable benign exam.
2.     Yearly follow-up mammogram suggested.
3.     BI-RADS:  Category 2- Benign Finding.

A NEGATIVE MAMMOGRAM REPORT DOES NOT PRECLUDE BIOPSY OR OTHER EVALUATION OF
A CLINICALLY PALPABLE OR OTHERWISE SUSPICOUS MASS OR LESION.  BREAST CANCER
MAY NOT BE DETECTED BY MAMMOGRAPHY IN UP TO 10% OF CASES.

## 2007-09-13 ENCOUNTER — Ambulatory Visit: Payer: Self-pay | Admitting: General Surgery

## 2007-09-13 IMAGING — MG MM CAD SCREENING MAMMO
1 series · 4 of 4 positions shown · non-contrast
Comparison: none

REASON FOR EXAM: screening mammo   CAT 2
COMMENTS:

[Series 1359: R CC · right · 4 of 4 slices shown]
[im 1/4]
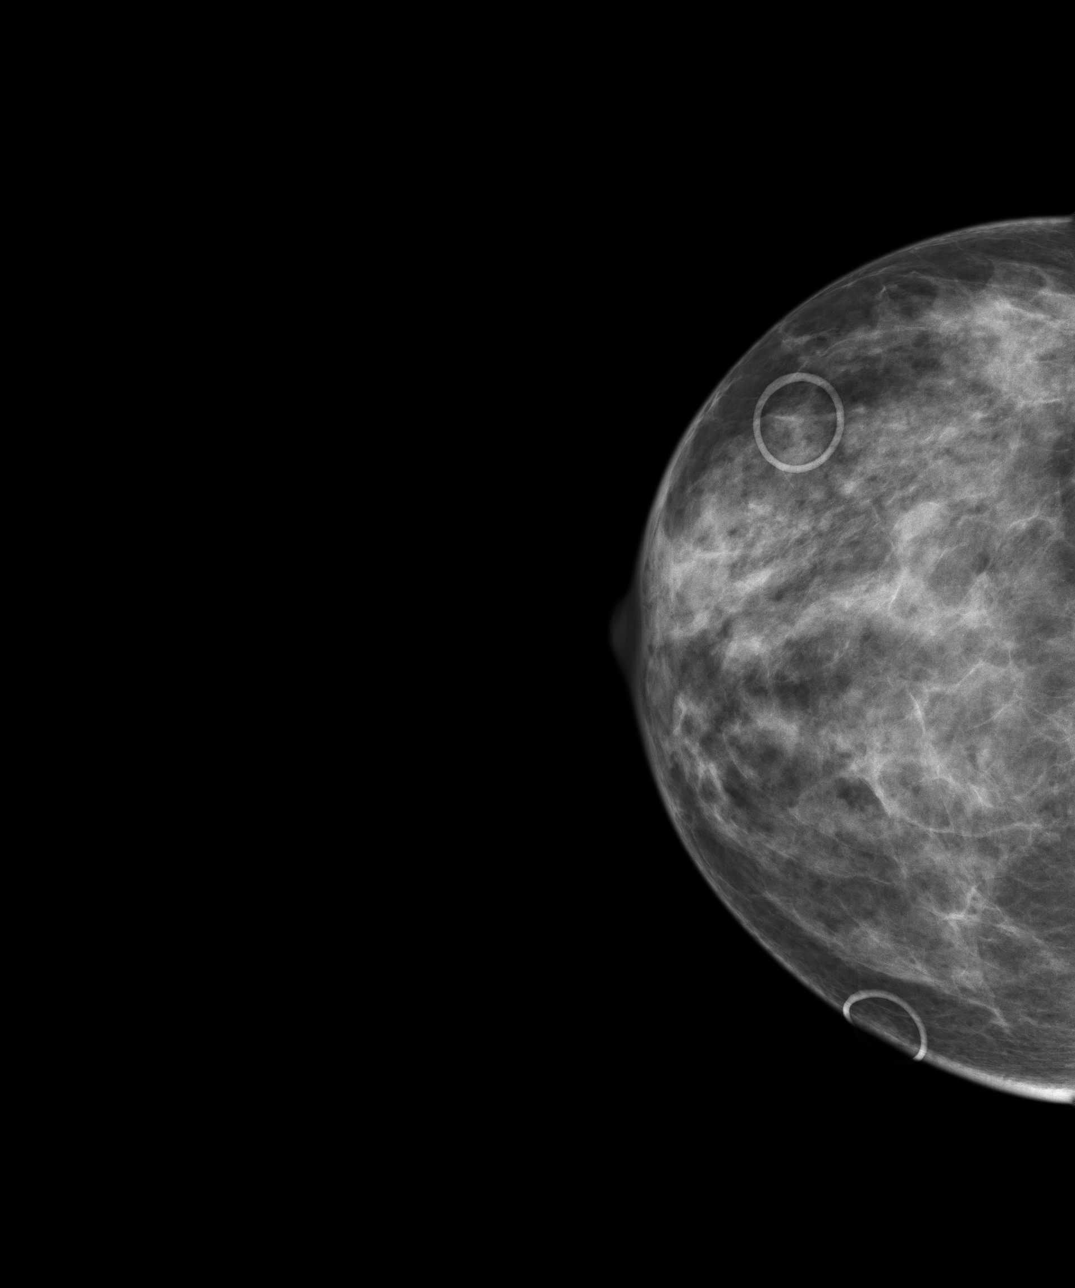
[im 2/4]
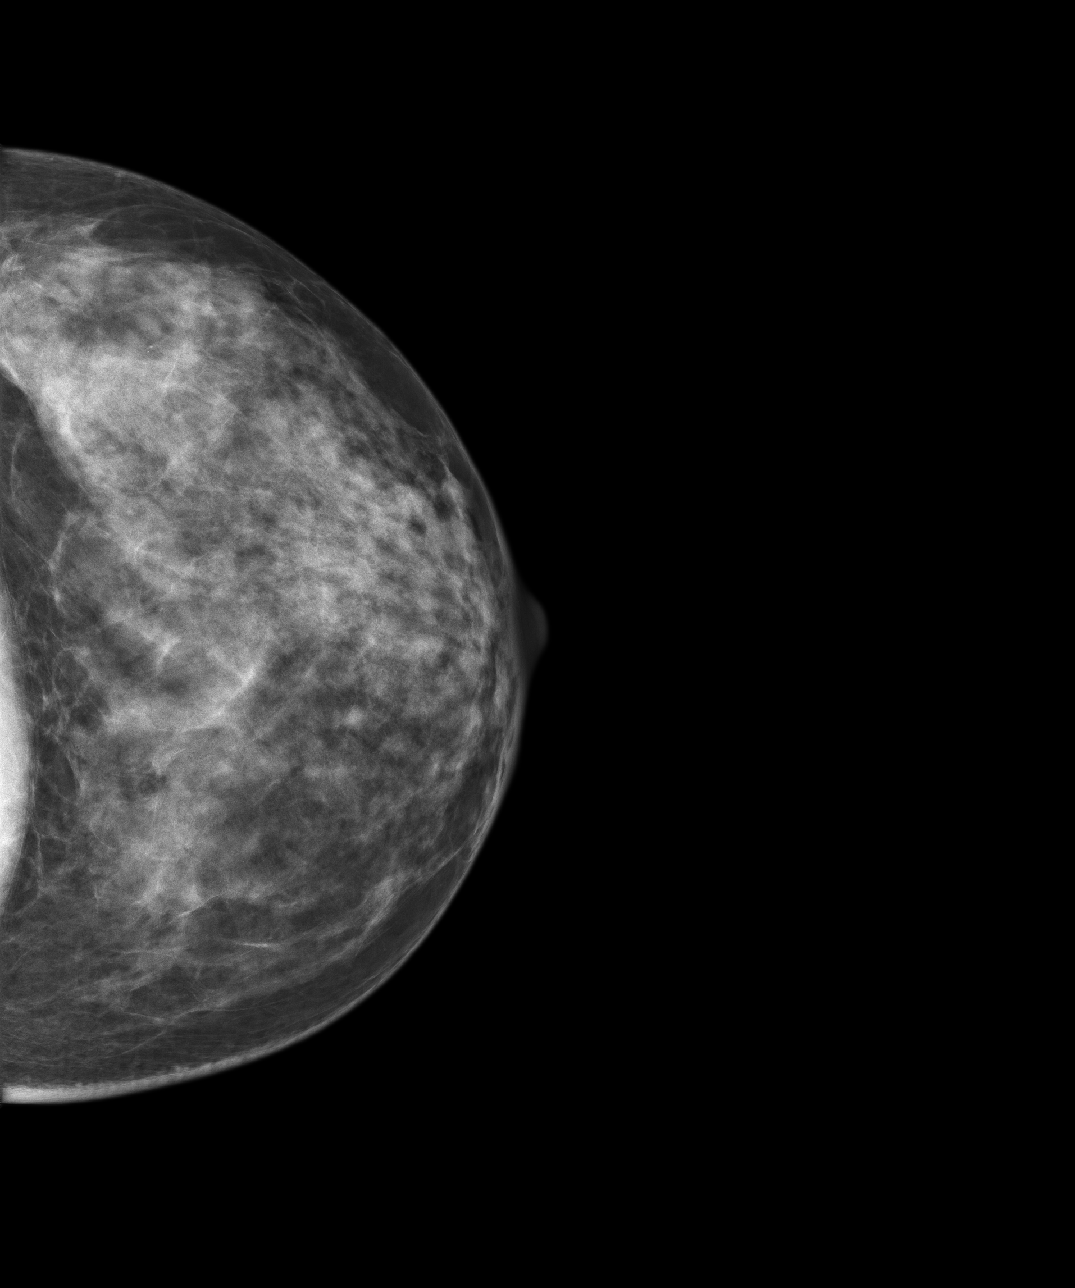
[im 3/4]
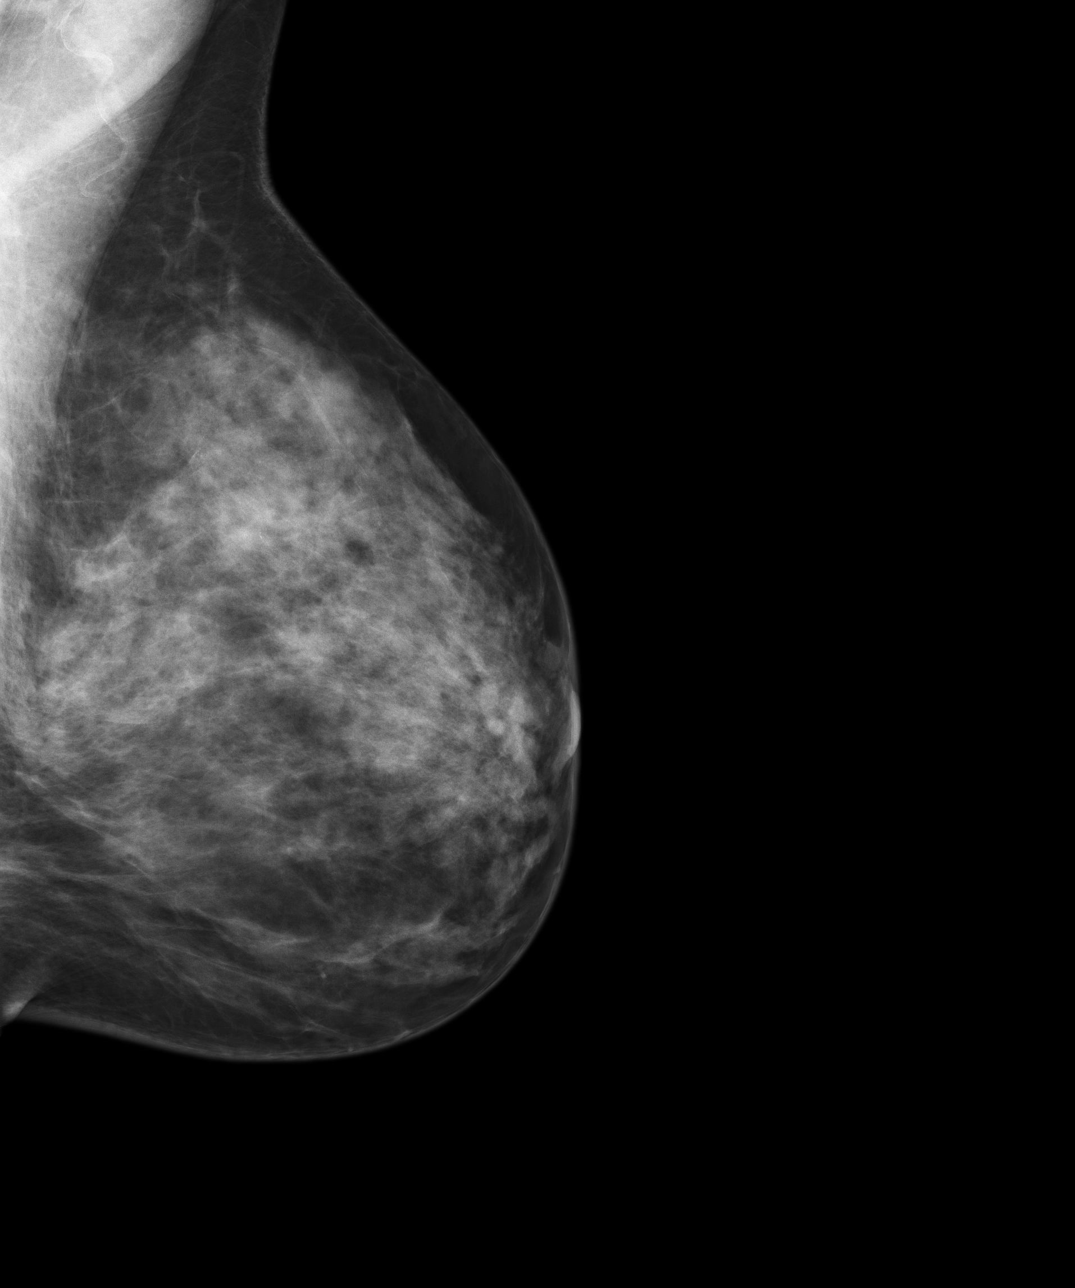
[im 4/4]
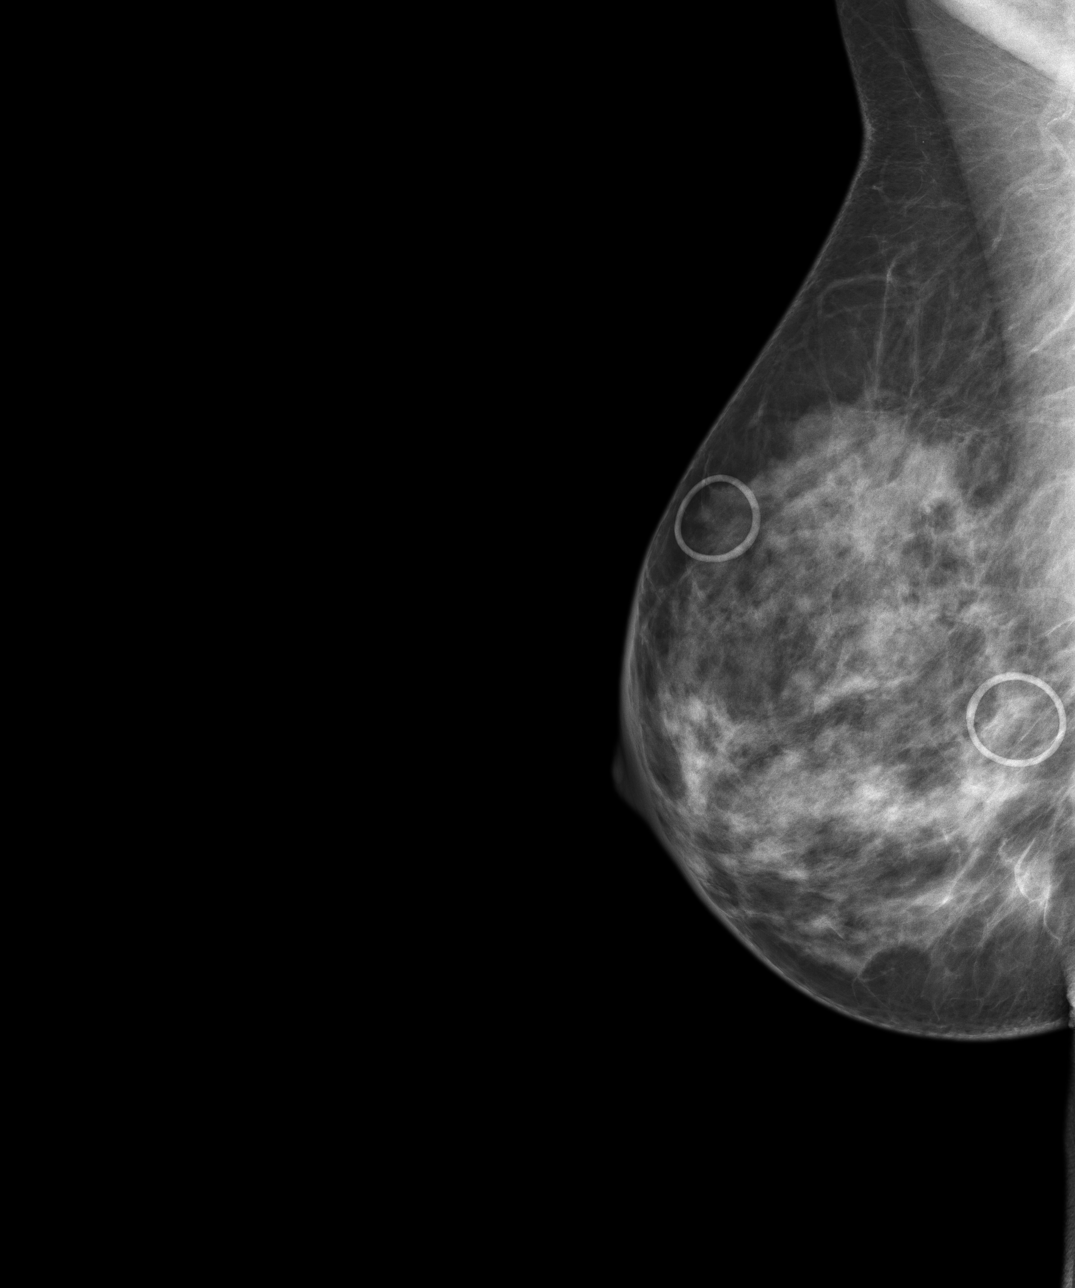

[4 of 4 positions shown; findings below may reference images not displayed]

PROCEDURE:     MAM - MAM DGTL SCREENING MAMMO W/CAD  - [DATE]  [DATE]

RESULT:     Comparison is made to study [DATE],[DATE],
and [DATE].  A film screening study dated [DATE] is reviewed
as well.

The breasts exhibit a moderately dense parenchymal pattern. There is no
dominant mass.  There are no malignant appearing groupings of
microcalcification. No area of new architectural distortion is seen.
IMPRESSION: 1)I do not see findings suspicious for malignancy.

BI-RADS: Category 2-Benign Finding

RECOMMENDATIONS
1)Please continue to encourage annual mammographic follow-up.

A NEGATIVE MAMMOGRAM REPORT DOES NOT PRECLUDE BIOPSY OR OTHER EVALUATION OF
A CLINICALLY PALPABLE OR OTHERWISE SUSPICIOUS MASS OR LESION. BREAST CANCER
MAY NOT BE DETECTED BY MAMMOGRAPHY IN UP TO 10% OF CASES.

## 2007-10-08 ENCOUNTER — Ambulatory Visit: Payer: Self-pay | Admitting: Internal Medicine

## 2007-10-08 ENCOUNTER — Other Ambulatory Visit: Payer: Self-pay

## 2008-05-21 ENCOUNTER — Ambulatory Visit: Payer: Self-pay | Admitting: Internal Medicine

## 2008-09-14 ENCOUNTER — Ambulatory Visit: Payer: Self-pay | Admitting: General Surgery

## 2008-09-14 IMAGING — MG MM CAD SCREENING MAMMO
1 series · 4 of 4 positions shown · non-contrast
Comparison: none

REASON FOR EXAM: scr mammo   CAT 2
COMMENTS:

PROCEDURE:     MAM - MAM DGTL SCREENING MAMMO W/CAD  - [DATE]  [DATE]
RESULT:       No dominant masses or pathologic clustered calcifications are
demonstrated.   Nodular parenchymal pattern with benign calcifications are
present. CAD evaluation is nonfocal.  Exam is stable from prior exam.

[R CC · right · 4 of 4 slices shown]
[im 1/4]
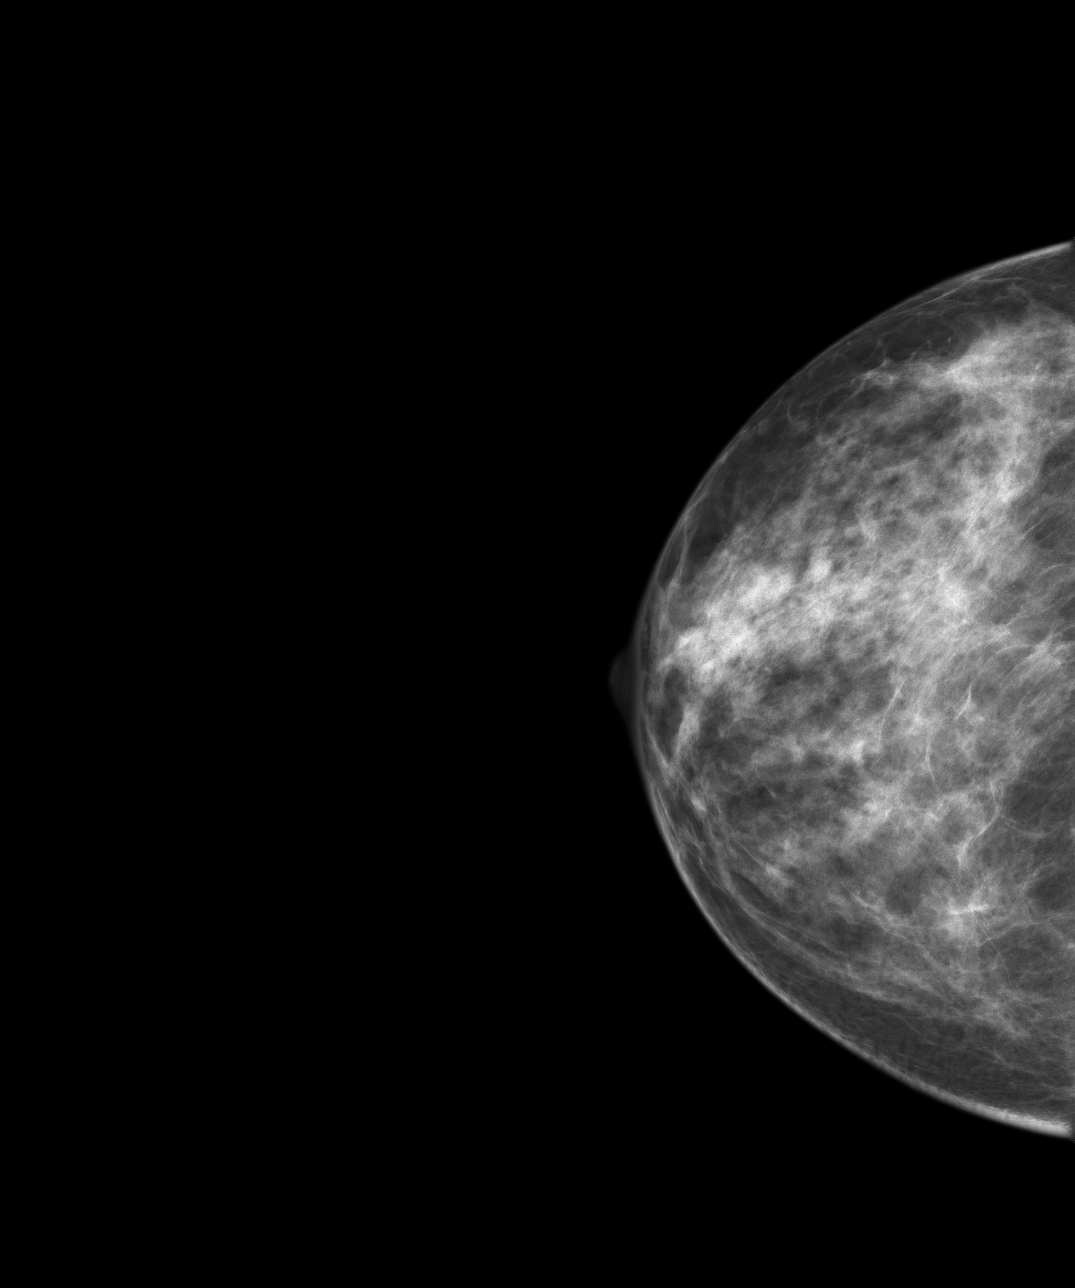
[im 2/4]
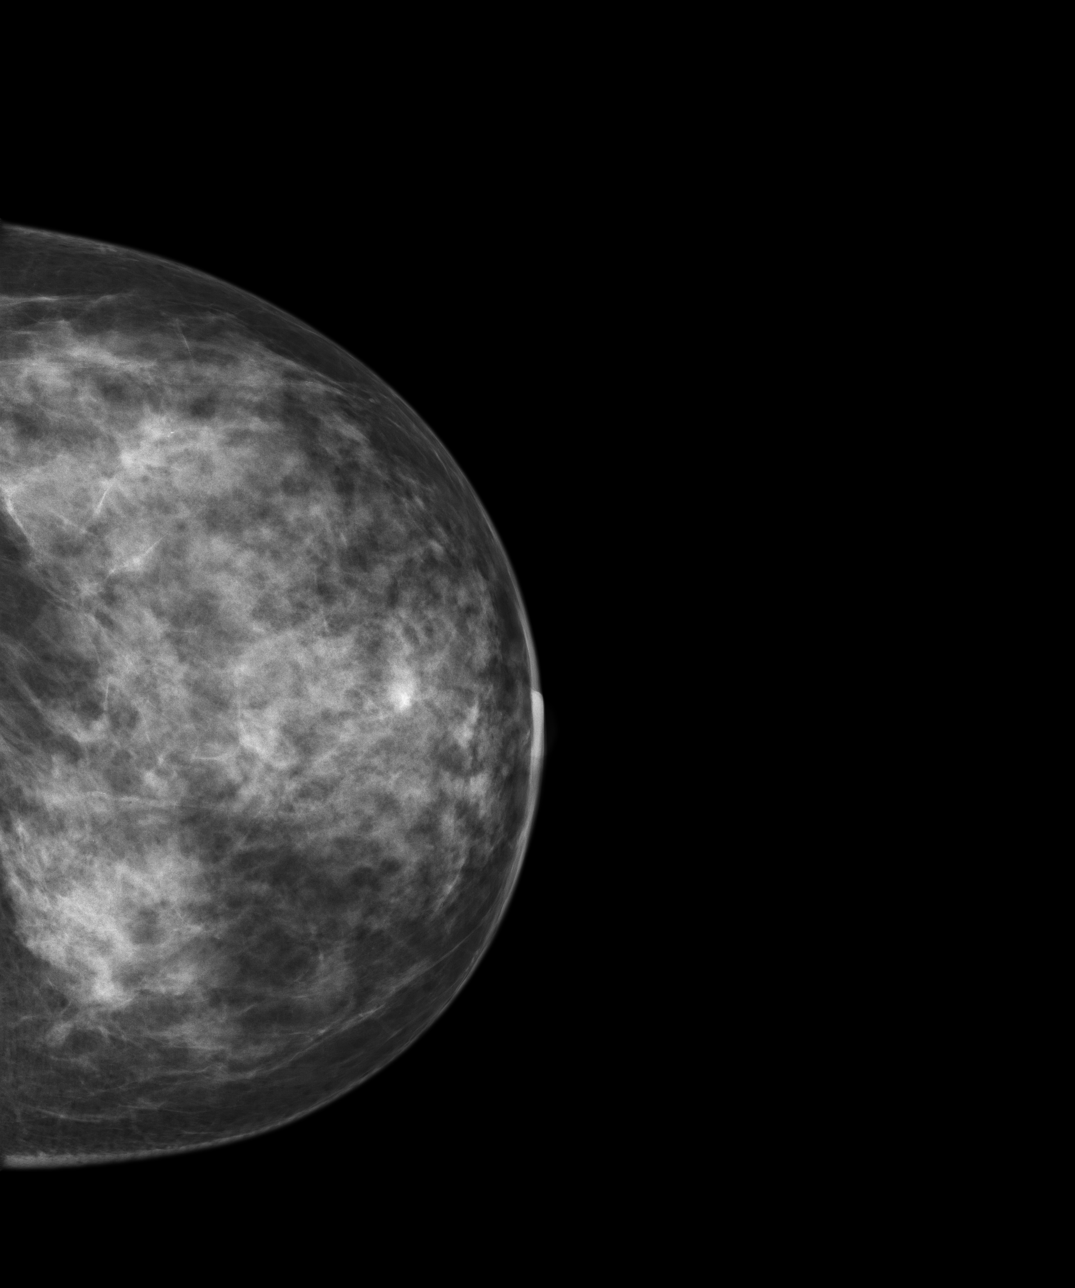
[im 3/4]
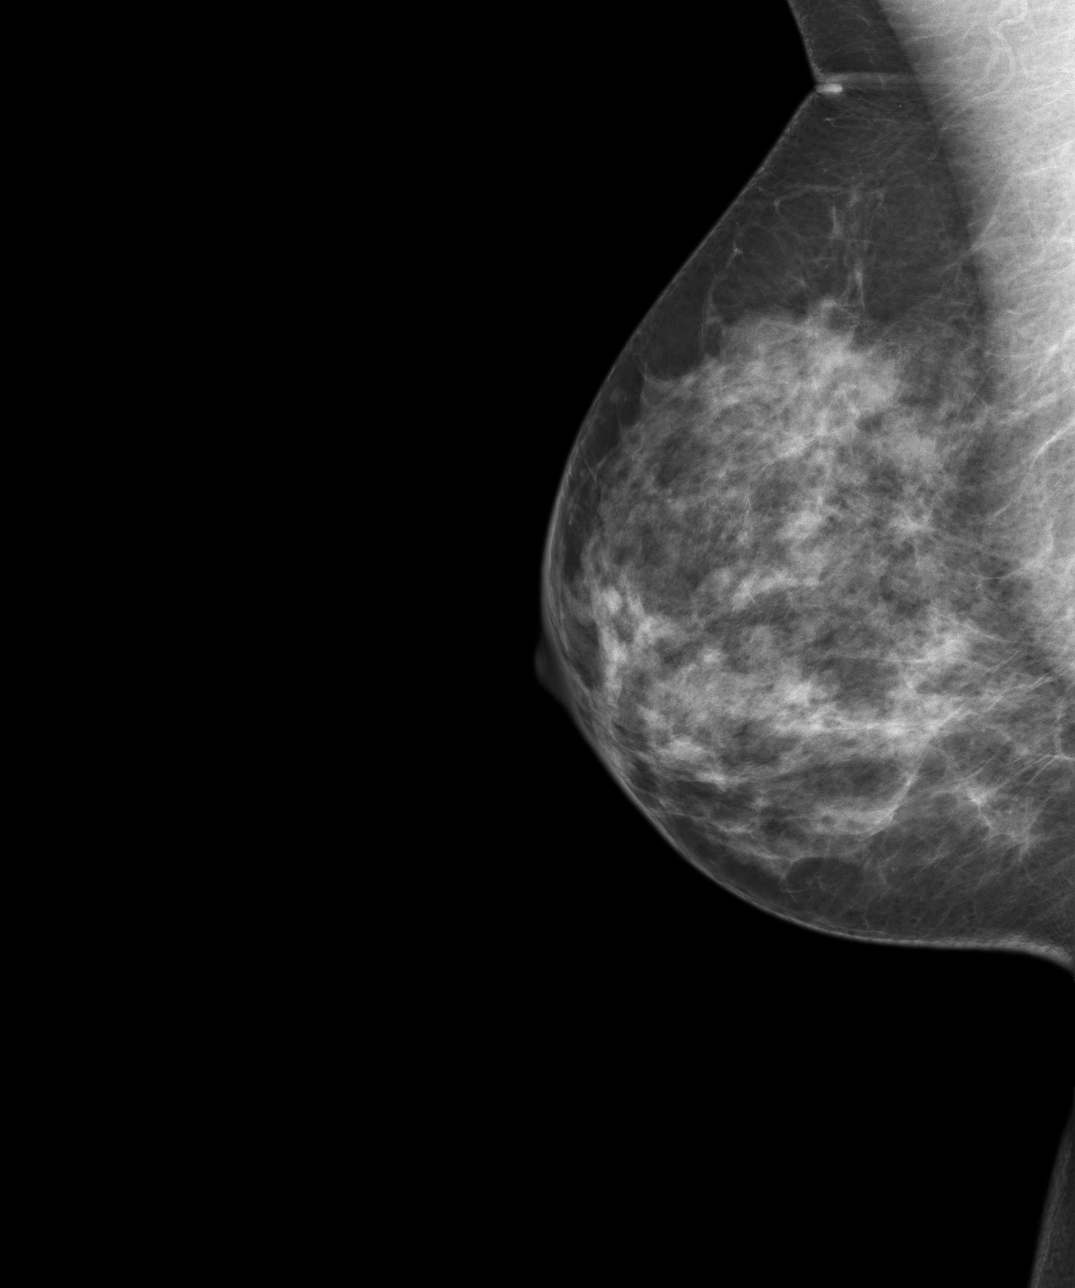
[im 4/4]
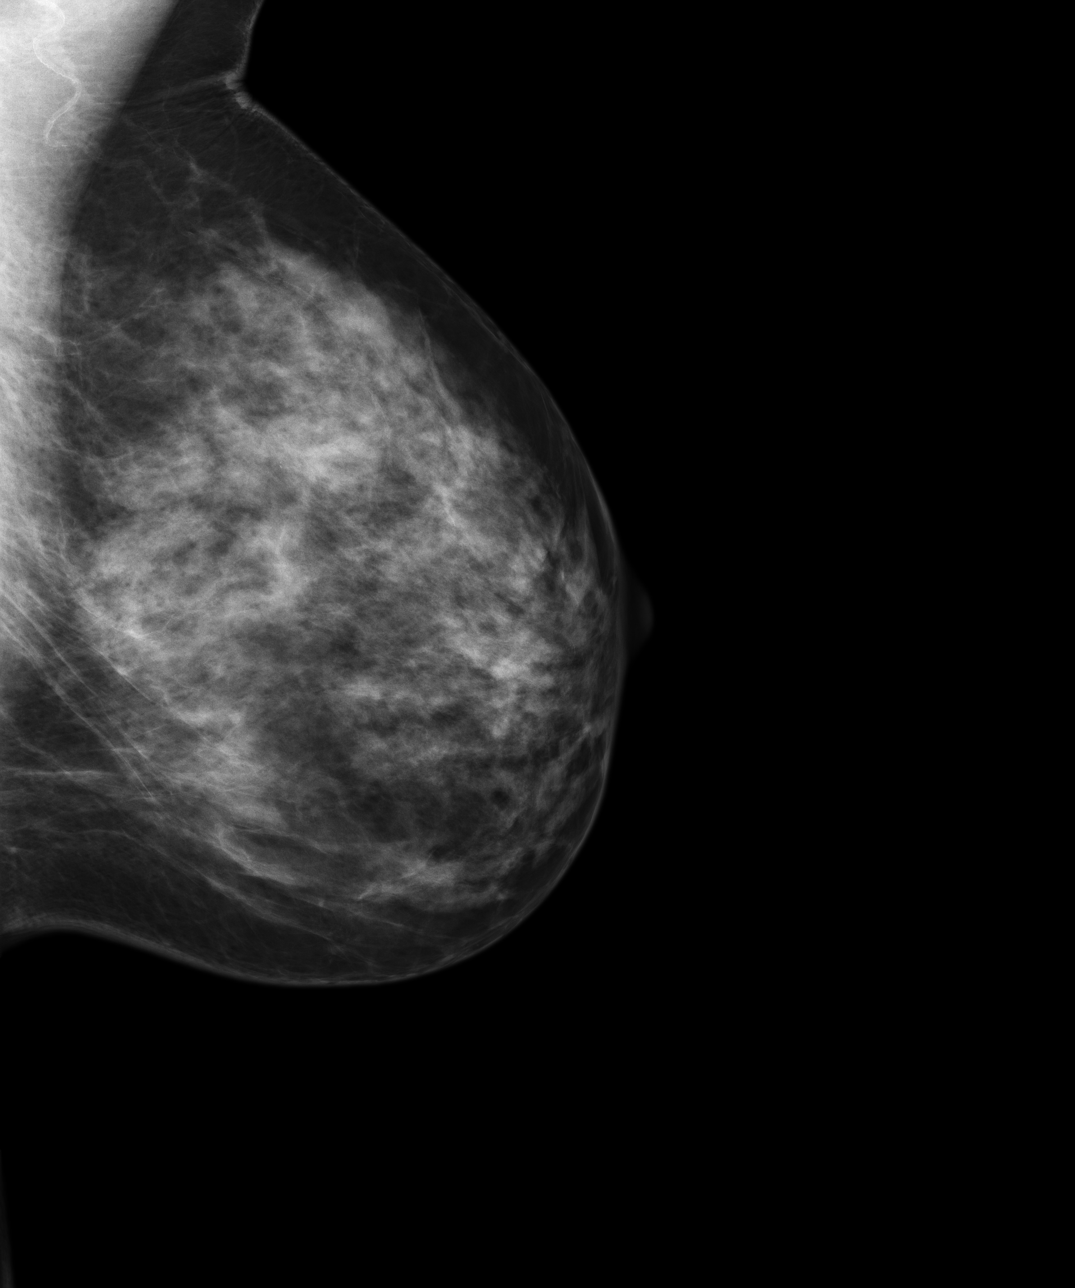

[4 of 4 positions shown; findings below may reference images not displayed]

IMPRESSION: 1.     Stable benign exam.
2.     Routine yearly follow-up mammograms suggested.
3.     BI-RADS:  Category 2-Benign Finding.

A NEGATIVE MAMMOGRAM REPORT DOES NOT PRECLUDE BIOPSY OR OTHER EVALUATION OF
A CLINICALLY PALPABLE OR OTHERWISE SUSPICIOUS MASS OR LESION.  BREAST CANCER
MAY NOT BE DETECTED BY MAMMOGRAPHY IN UP TO 10% OF CASES.

## 2009-09-15 ENCOUNTER — Ambulatory Visit: Payer: Self-pay | Admitting: General Surgery

## 2009-09-15 IMAGING — MG MM CAD SCREENING MAMMO
1 series · 4 of 4 positions shown · non-contrast
Comparison: none

REASON FOR EXAM: SCREENING MAMMO
COMMENTS:  Submitted by practice: [HOSPITAL] Scheduled by
user:

[R CC · right · 4 of 4 slices shown]
[im 1/4]
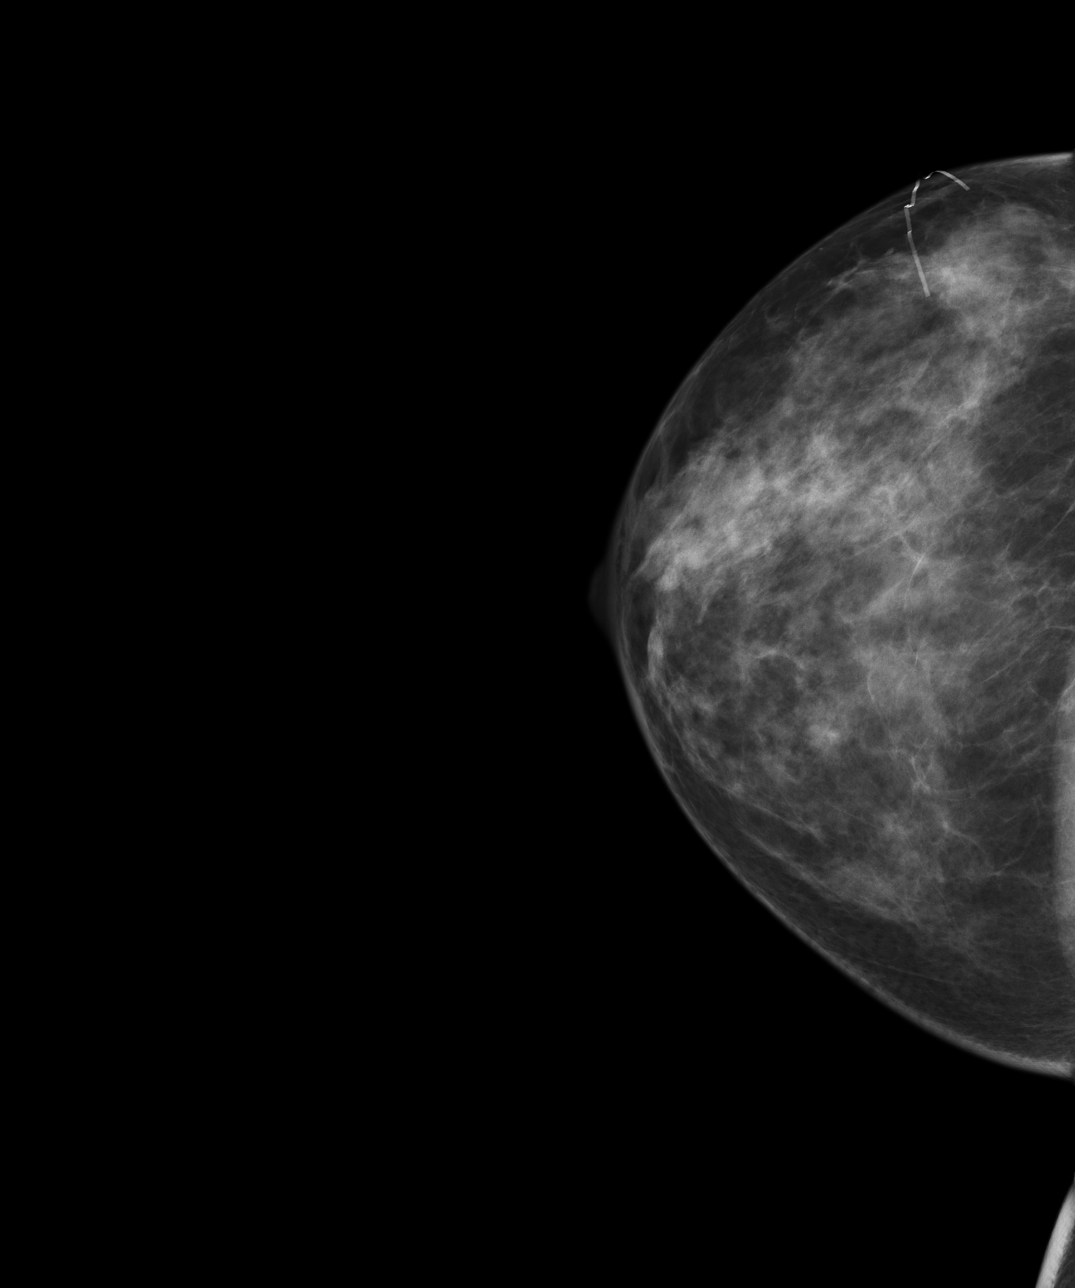
[im 2/4]
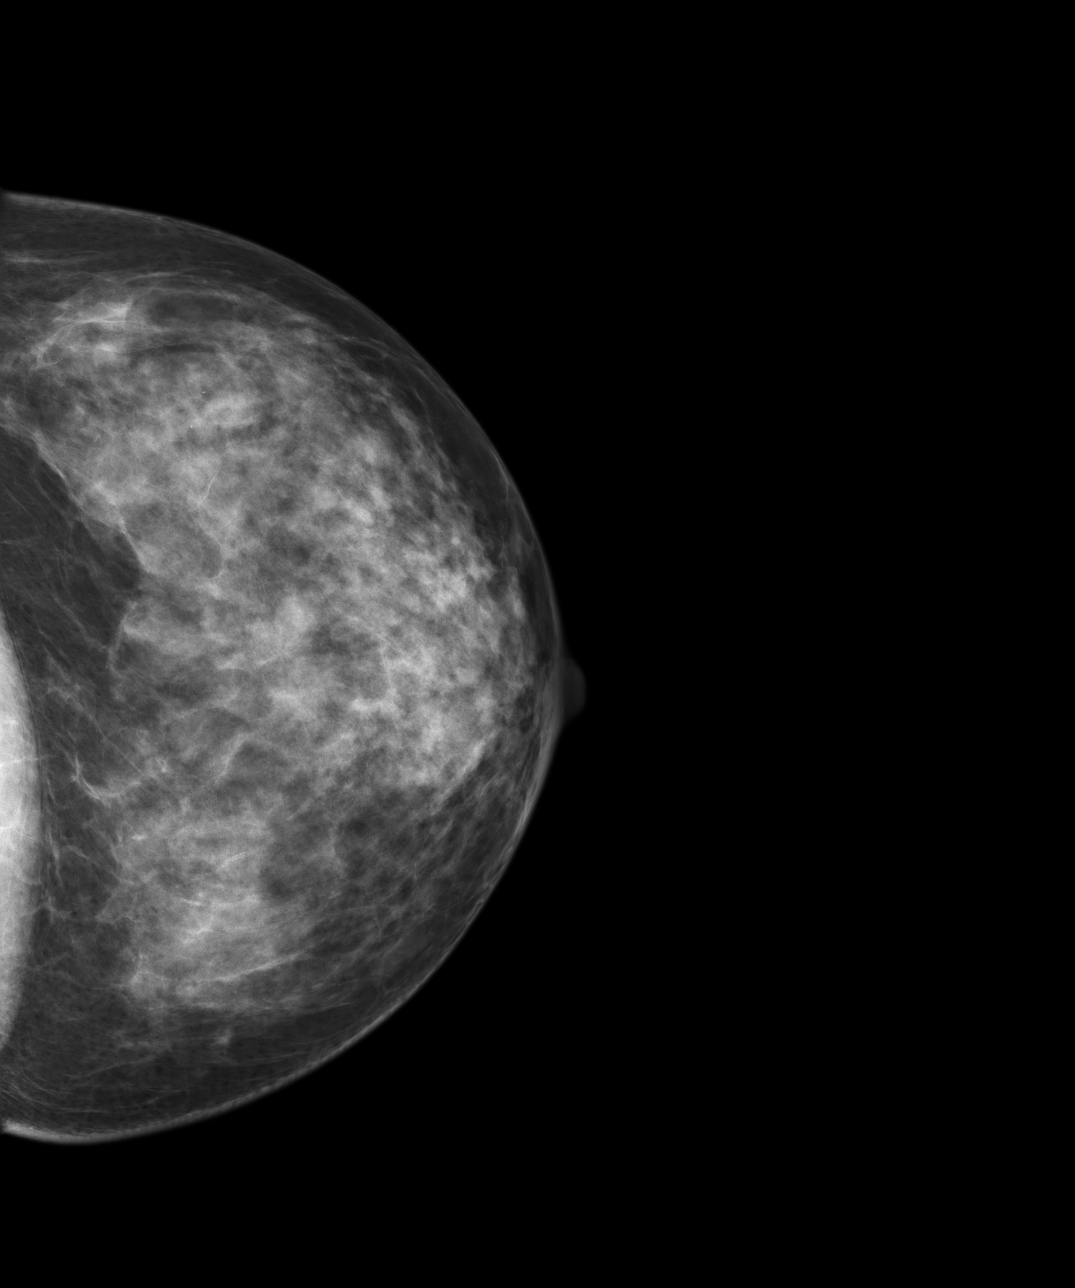
[im 3/4]
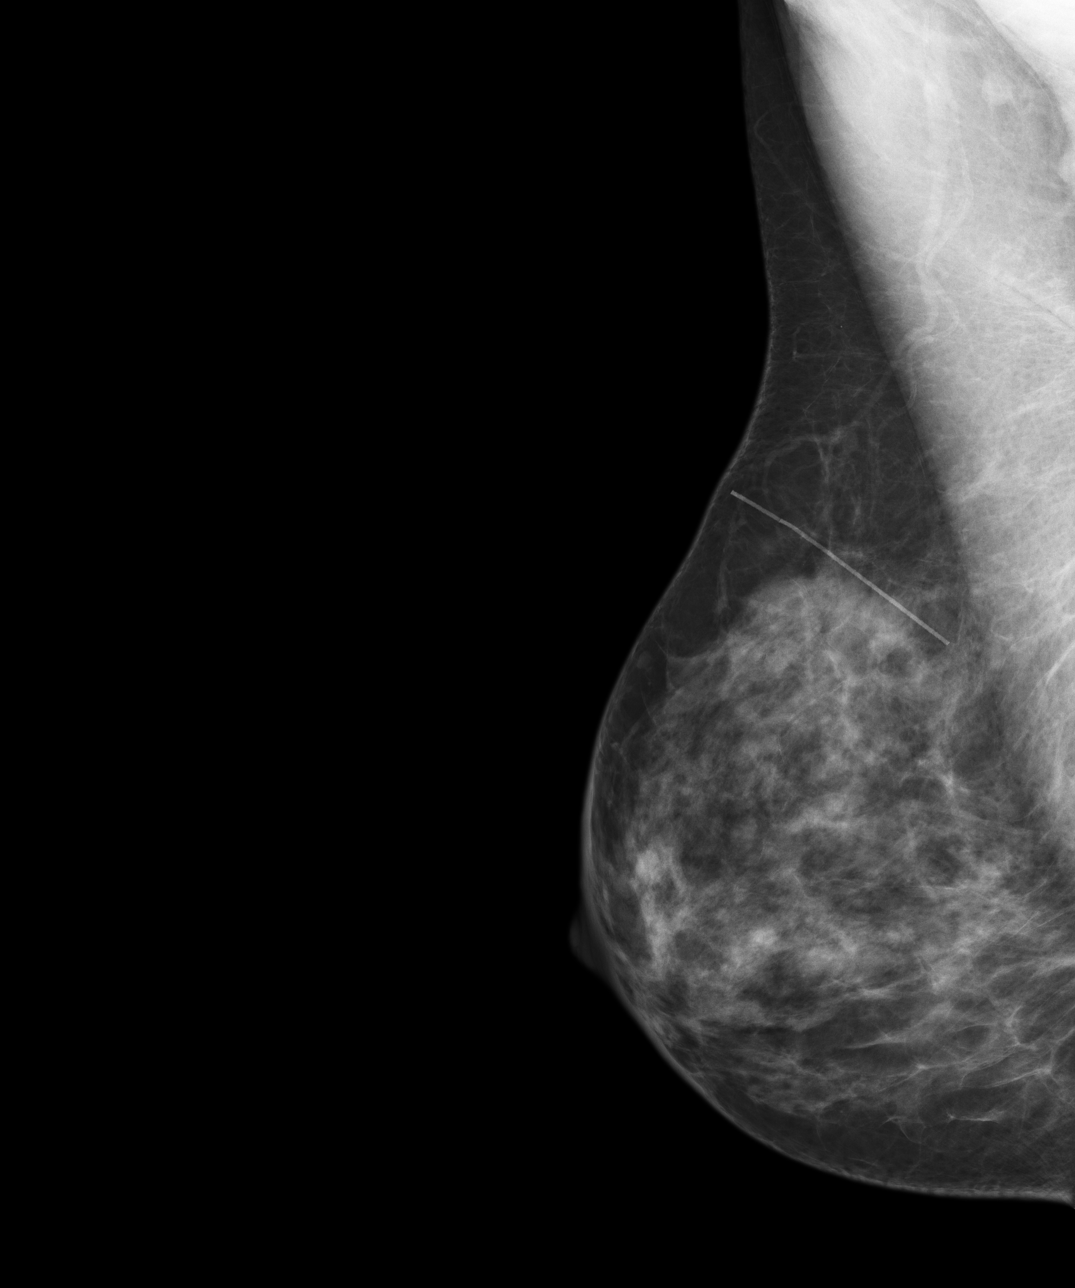
[im 4/4]
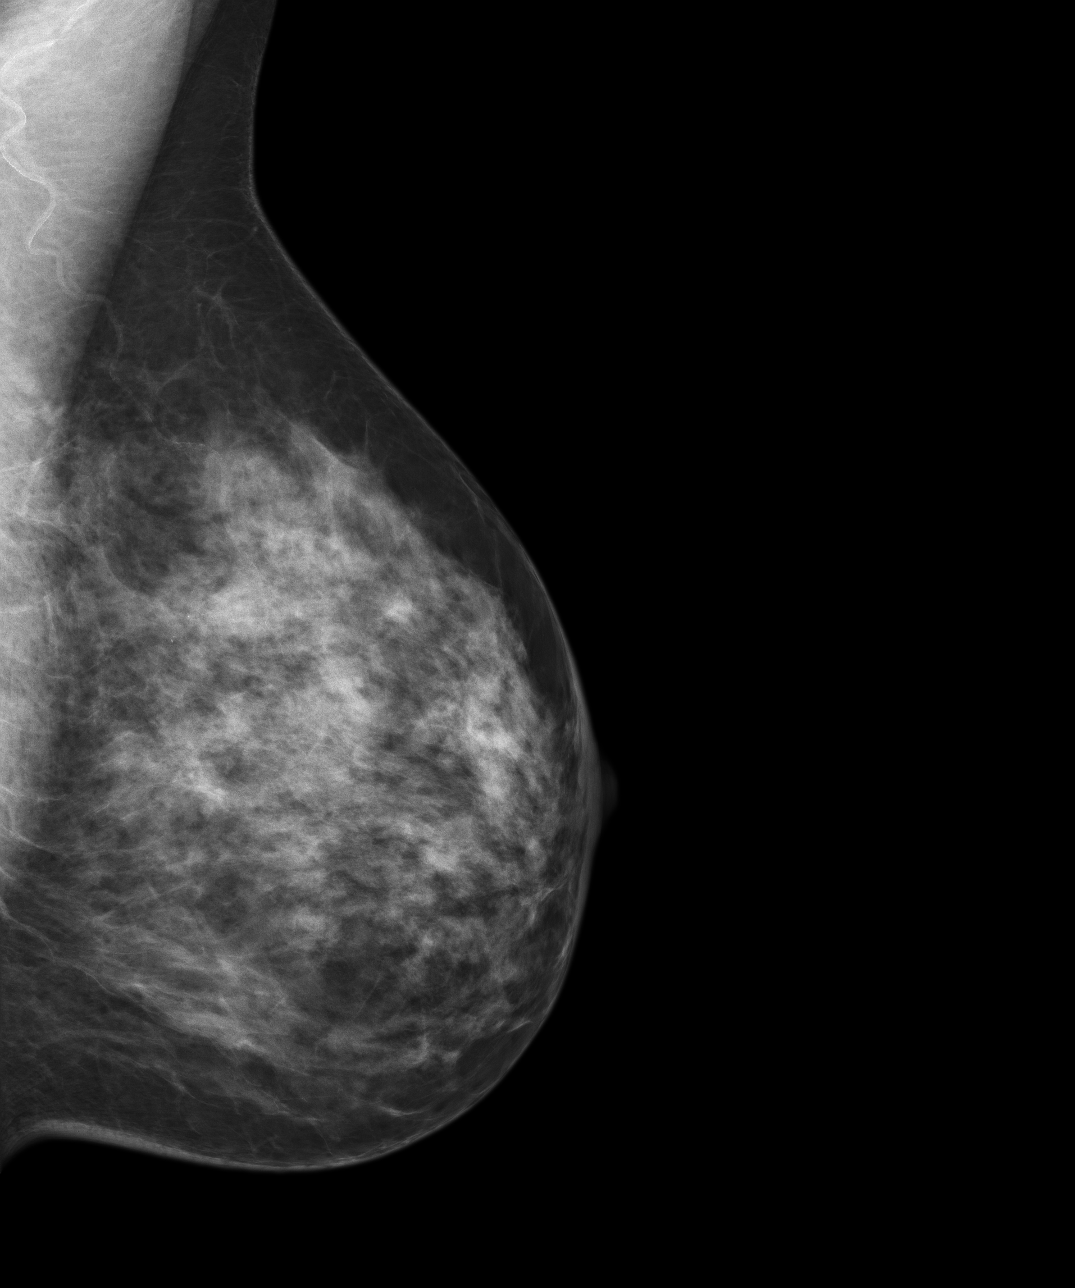

[4 of 4 positions shown; findings below may reference images not displayed]

PROCEDURE:     MAM - MAM DGTL SCREENING MAMMO W/CAD  - [DATE]  [DATE]

RESULT:        Comparison is made to a prior digital study dated [DATE] as
well as [DATE] and [DATE].  A film screen study dated [DATE] is reviewed
as well.

The breasts exhibit a dense nodular parenchymal pattern.  There is no
dominant mass.  There are no malignant-appearing groupings of
microcalcification.  No area of new architectural distortion is seen.
IMPRESSION: 1.      I do not see findings suspicious for malignancy.
2.      BI-RADS:  Category 2- Benign Finding.

RECOMMENDATION:  Please continue to encourage yearly mammographic follow up.

A negative mammogram report does not preclude biopsy or other evaluation of
a clinically palpable or otherwise suspicious mass or lesion. Breast cancer
may not be detected by mammography in up to 10% of cases.

## 2009-11-22 ENCOUNTER — Ambulatory Visit: Payer: Self-pay | Admitting: Internal Medicine

## 2009-11-22 IMAGING — US US PELV - US TRANSVAGINAL
1 series · 17 of 25 positions shown · non-contrast
Comparison: none

REASON FOR EXAM: non tender mass LLQ    abnormal finding on examination
by doctor
COMMENTS:

[Series 1: us pelv - us transvaginal · 17 of 36 slices shown]
[im 1/36]
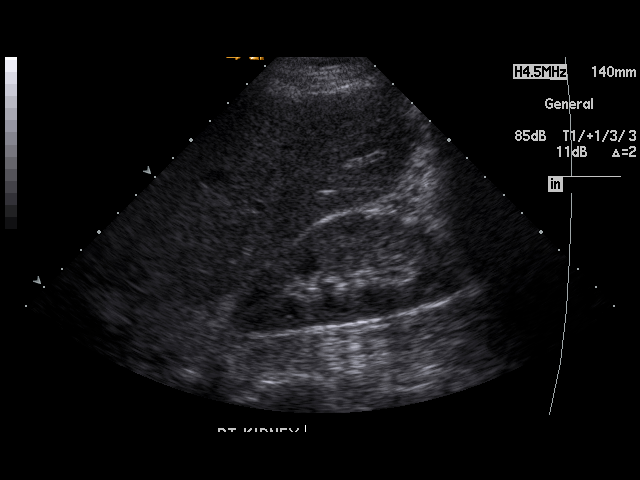
[im 3/36]
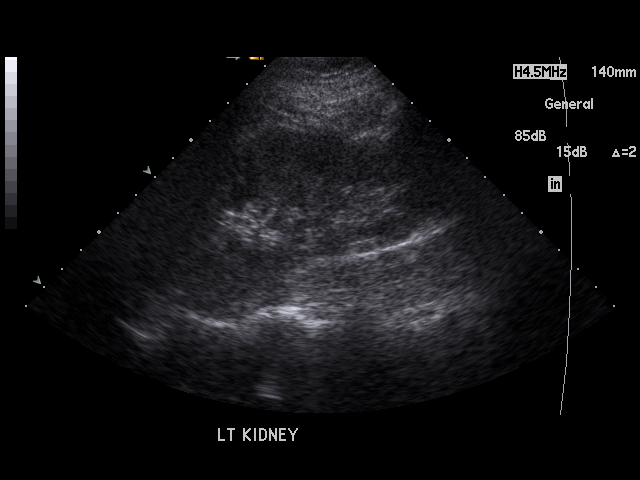
[im 5/36]
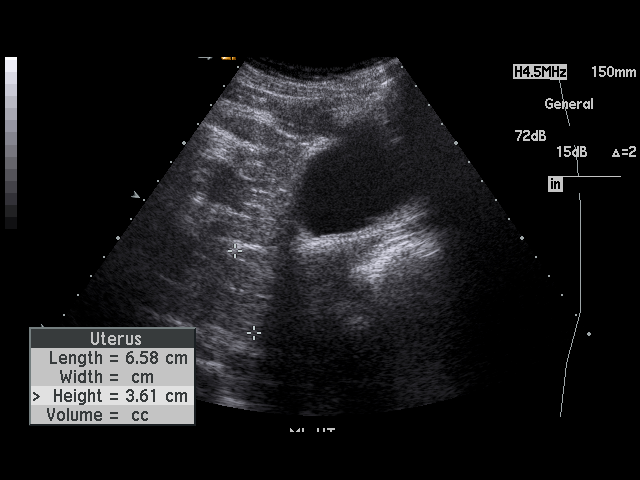
[im 8/36]
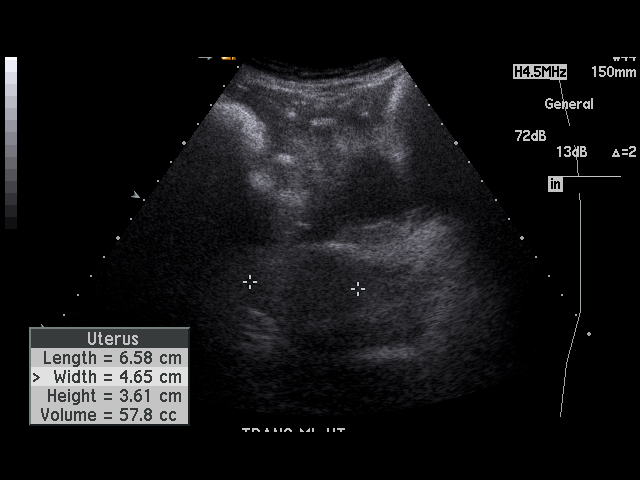
[im 9/36]
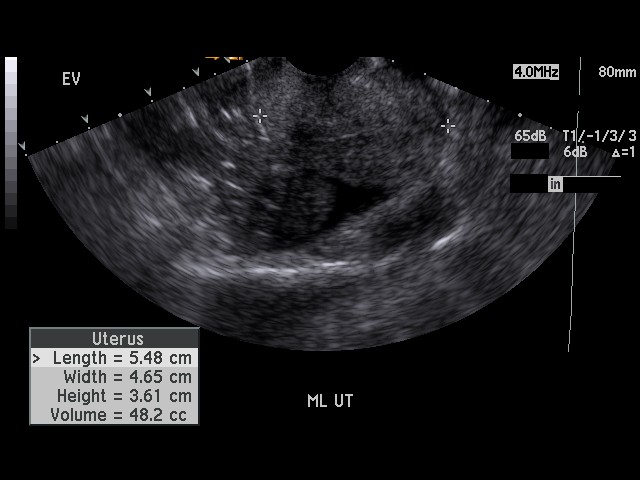
[im 12/36]
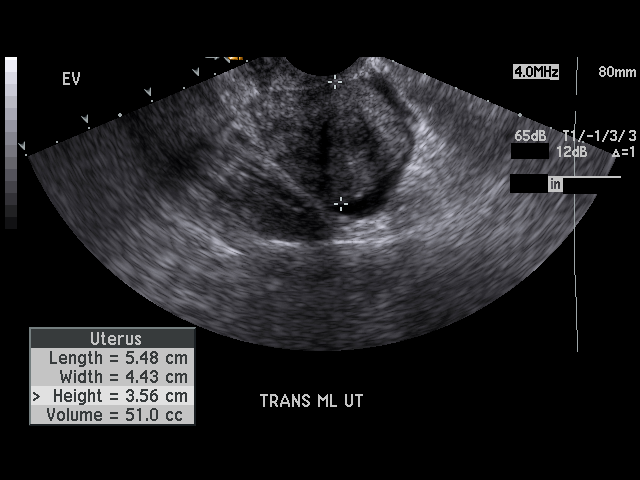
[im 14/36]
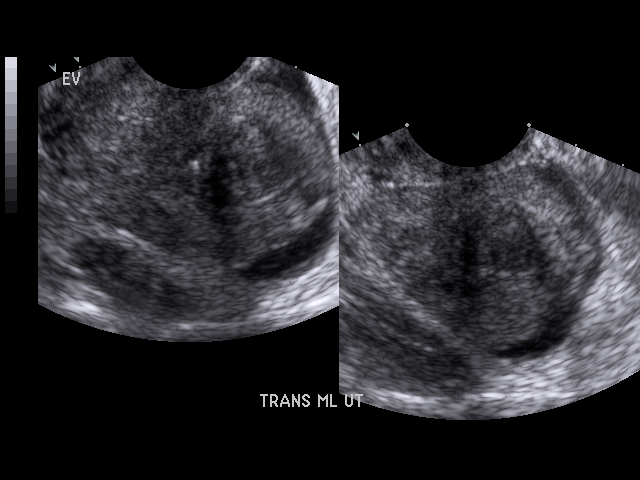
[im 17/36]
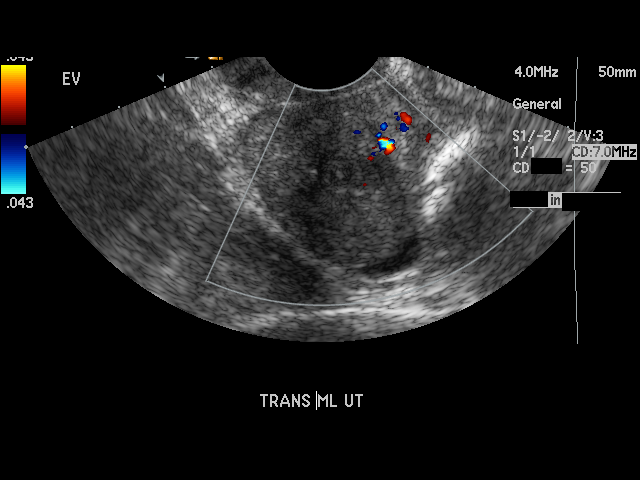
[im 18/36]
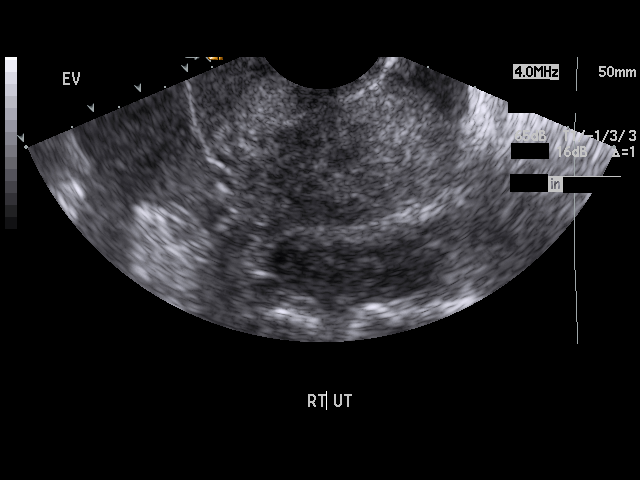
[im 19/36]
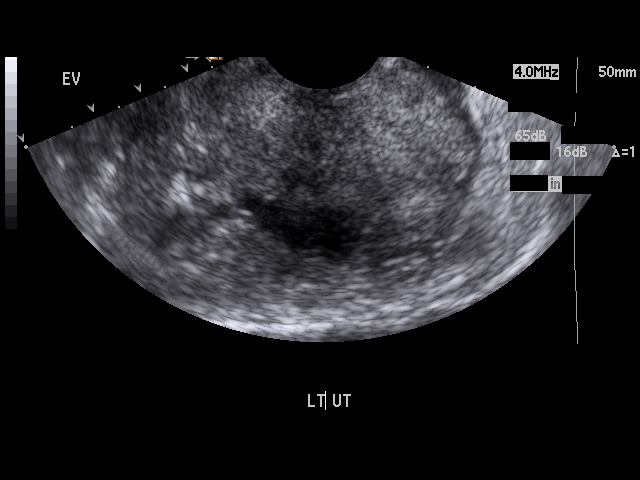
[im 22/36]
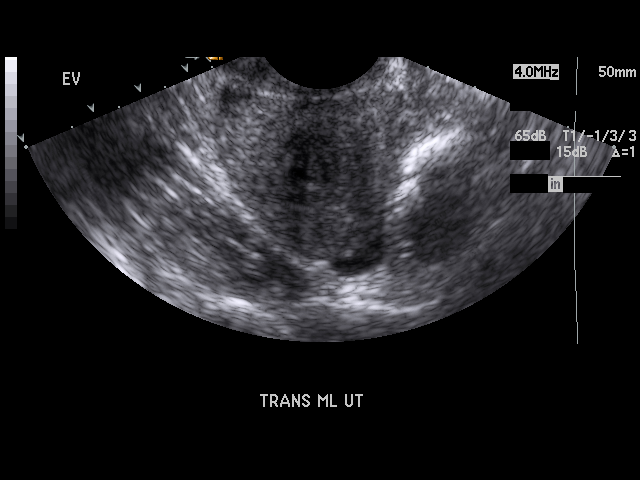
[im 24/36]
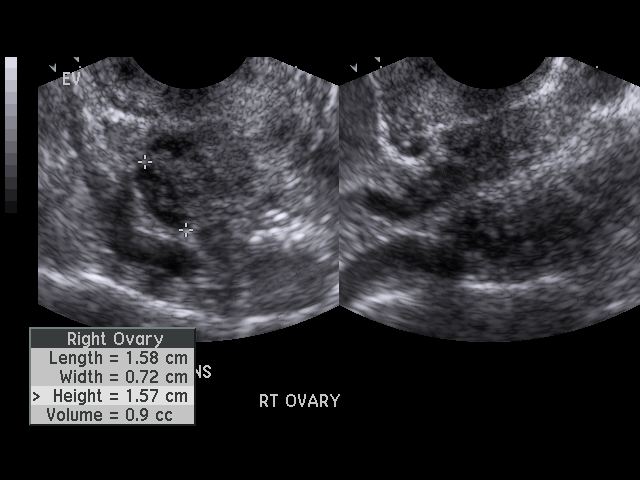
[im 27/36]
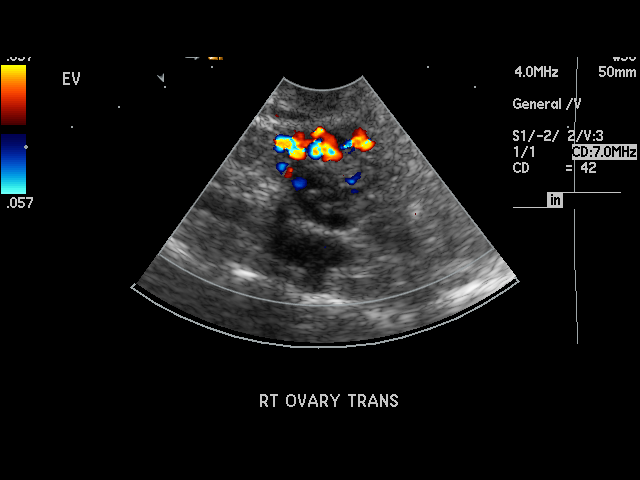
[im 28/36]
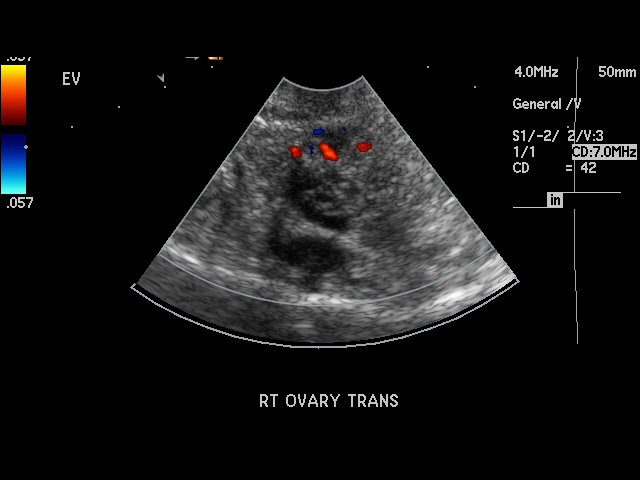
[im 31/36]
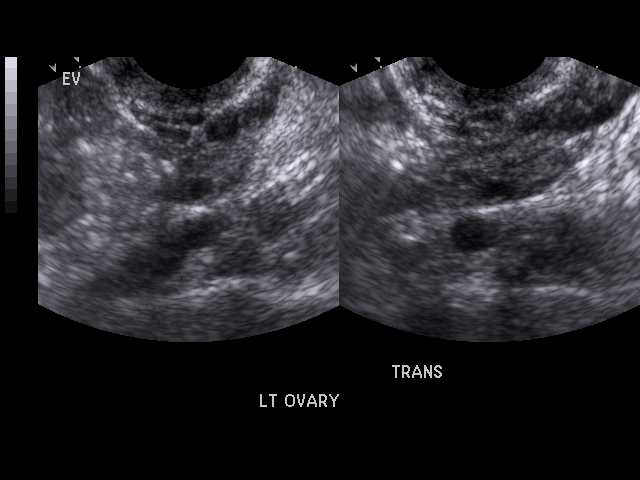
[im 33/36]
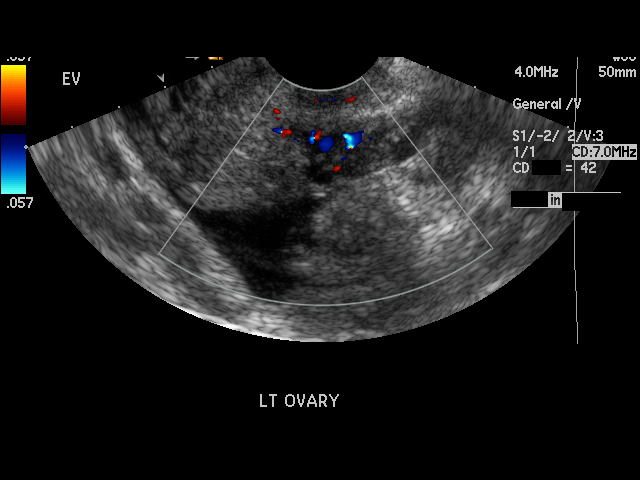
[im 36/36]
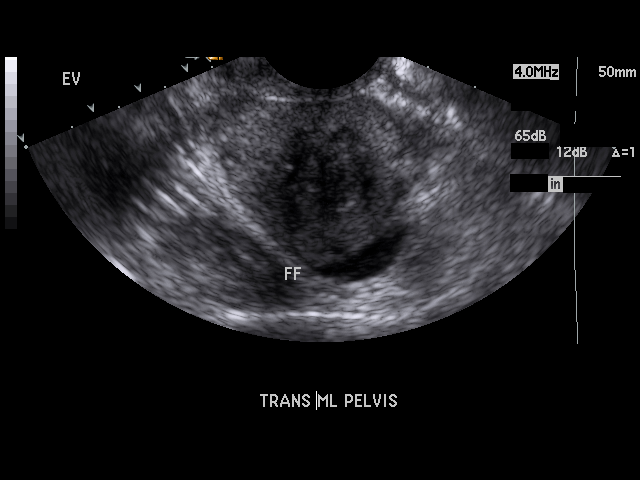

[17 of 25 positions shown; findings below may reference images not displayed]

PROCEDURE:     US  - US PELVIS EXAM W/TRANSVAGINAL  - [DATE]  [DATE]

RESULT:     The uterus is retroverted. It measures 5.5 x 4.4 x 3.6 cm. In
the uterine fundus in the midline there is a fibroid measuring 1.8 x 1.3 x
1.2 cm. The endometrial stripe measures approximately 2 mm in greatest
thickness. There is a small amount of free fluid in the cul-de-sac.

The right ovary measures 1.6 x 0.7 x 1.6 cm. The left ovary measures 2.1 x
1.9 x 0.9 cm. The echotexture of the ovaries is within the limits of normal.
IMPRESSION: 1. There is a fundal fibroid within the retroflexed uterus. I do not see
abnormal thickening of the endometrial stripe.
2. The ovaries are normal in appearance.
3. There is a small amount of free fluid in the cul-de-sac.
4. I do not see evidence of an abnormal pelvic mass.

Followup pelvic CT scanning may be useful if the patient's clinical findings
persist.

## 2010-08-25 ENCOUNTER — Ambulatory Visit: Payer: Self-pay | Admitting: Internal Medicine

## 2010-08-25 IMAGING — MG MM CAD SCREENING MAMMO
1 series · 4 of 4 positions shown · non-contrast
Comparison: none

REASON FOR EXAM: SCR
COMMENTS:

[Series 5035: R CC · right · 4 of 4 slices shown]
[im 1/4]
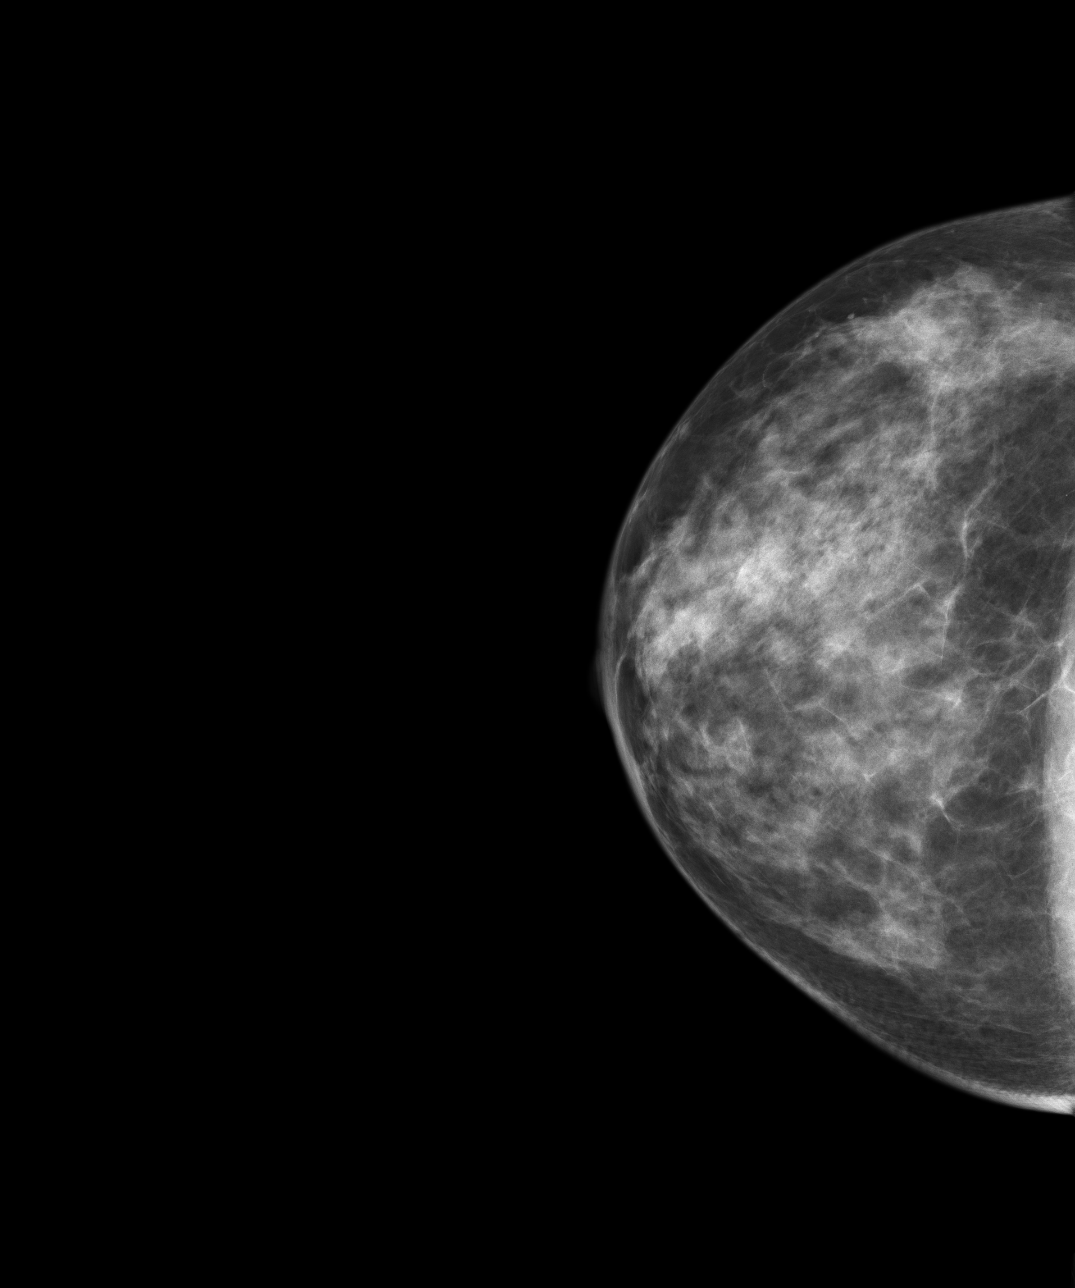
[im 2/4]
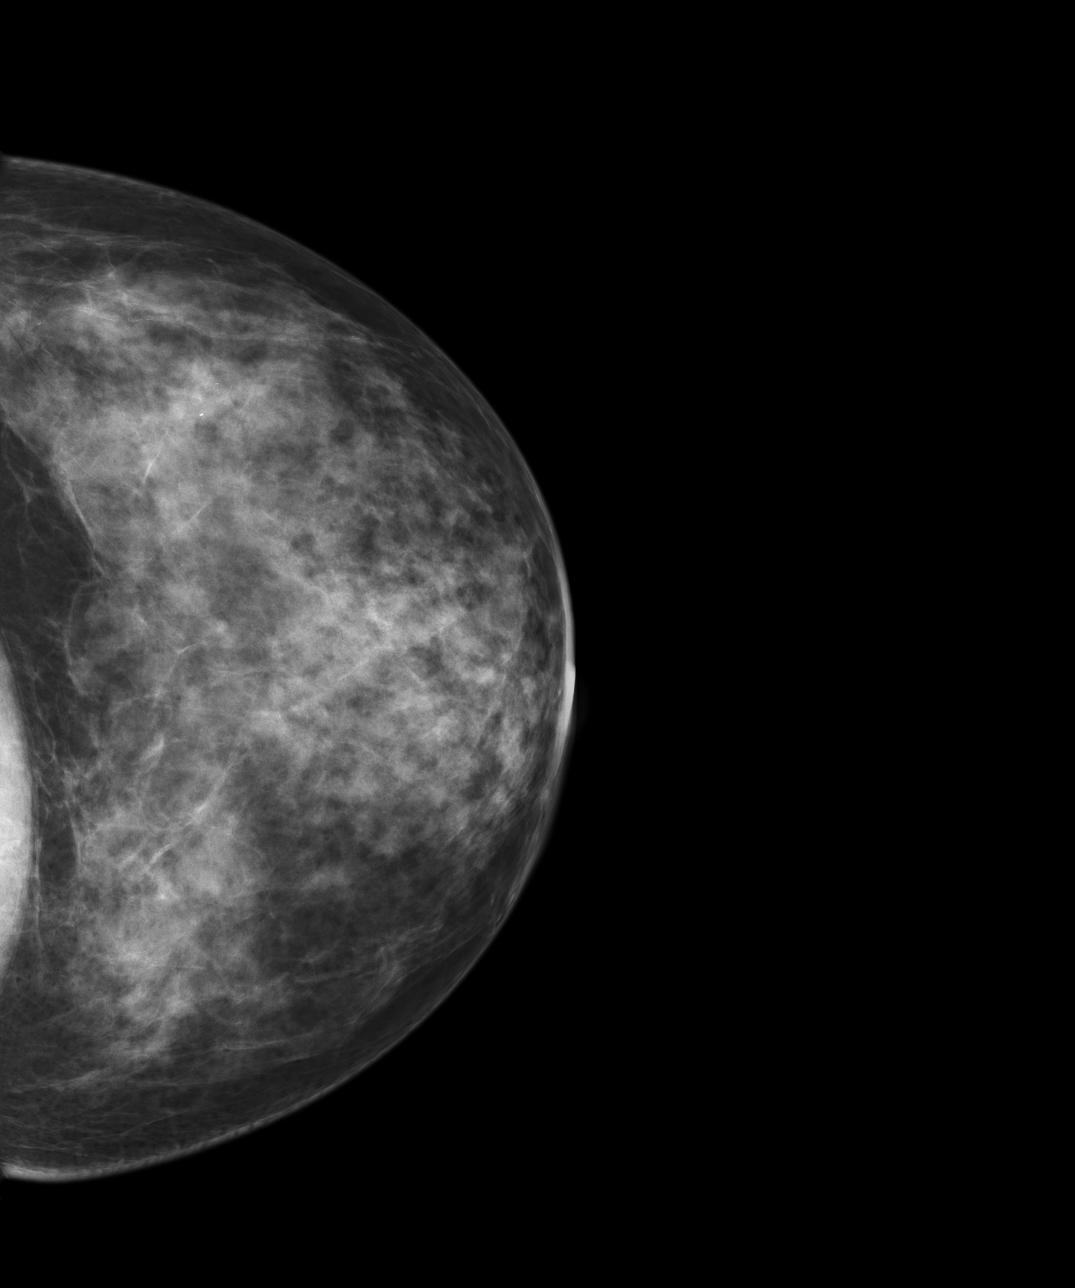
[im 3/4]
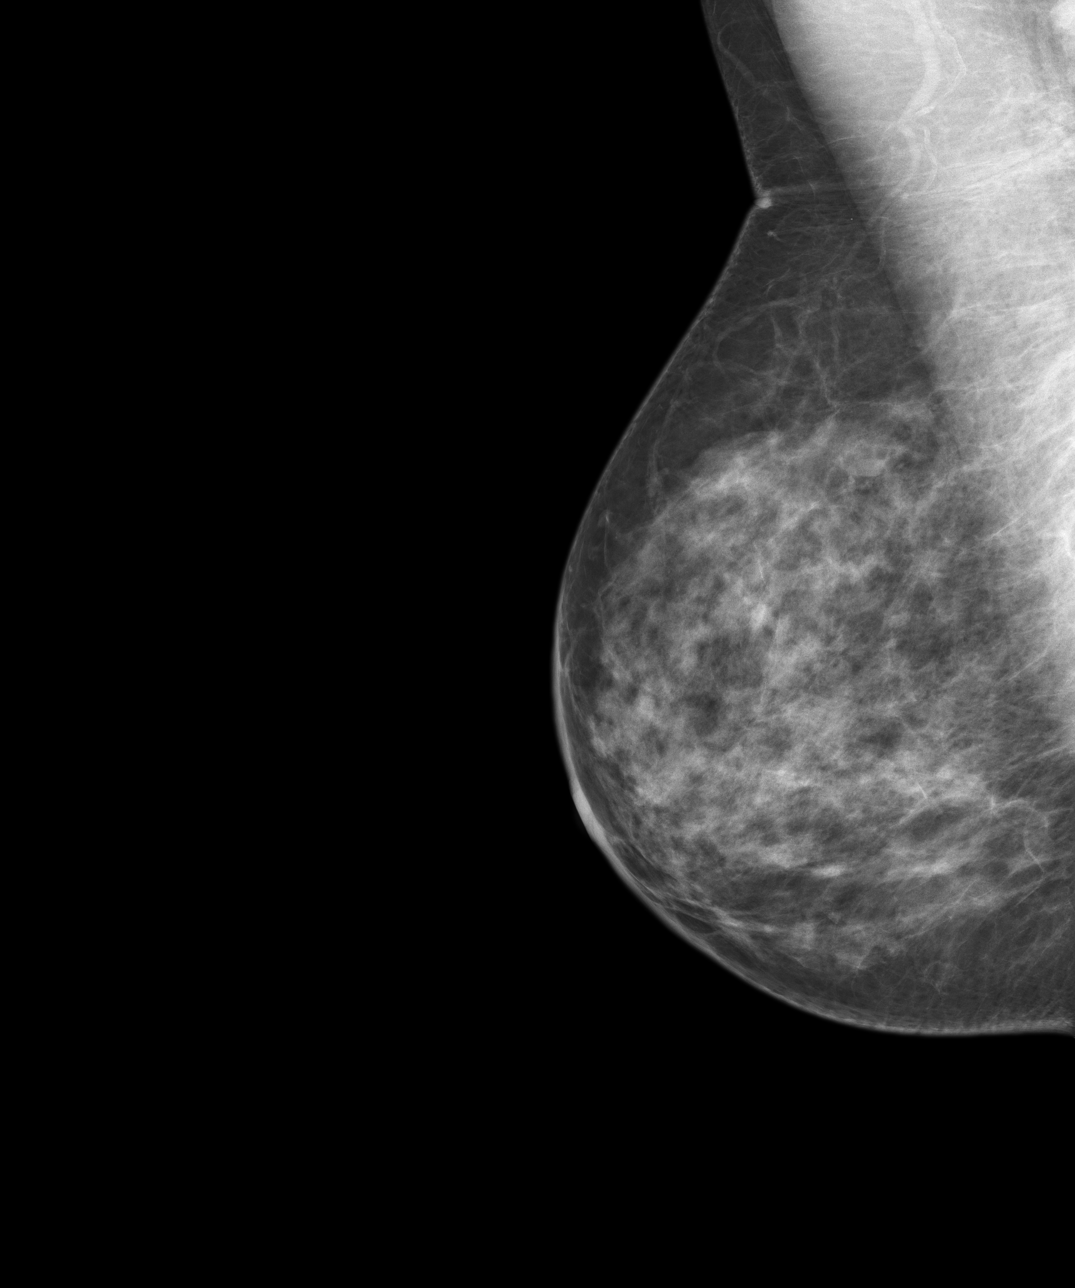
[im 4/4]
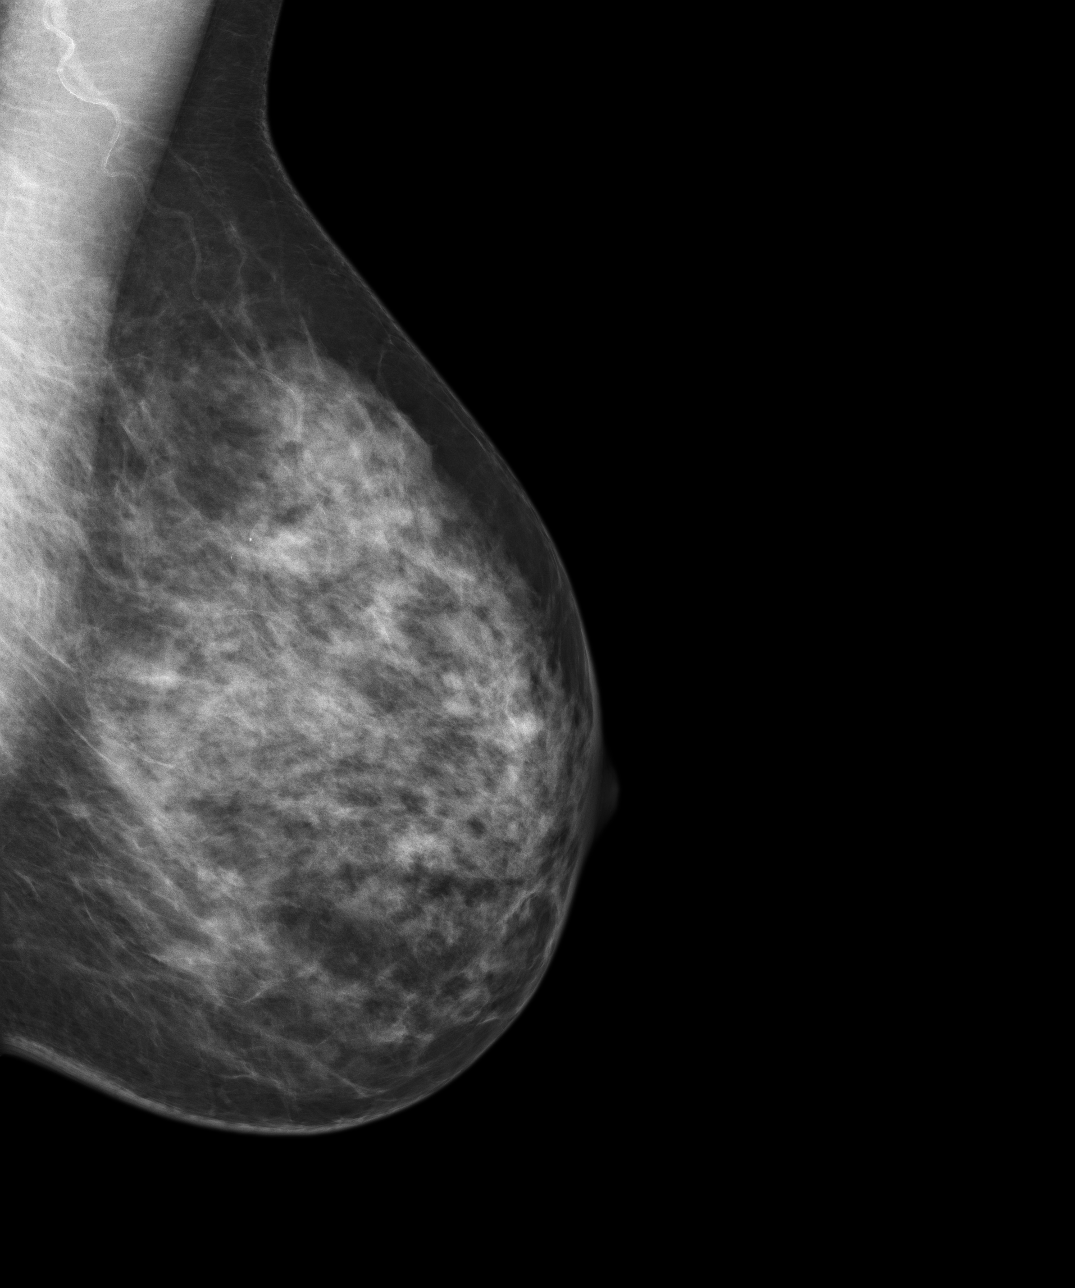

[4 of 4 positions shown; findings below may reference images not displayed]

PROCEDURE:     MAM - MAM DGTL SCREENING MAMMO W/CAD  - [DATE] [DATE]

RESULT:     Comparison is made to a prior digital study dated [DATE], as well as [DATE] and [DATE].  A film screen study
dated [DATE] is reviewed as well.

The breasts exhibit a moderately dense parenchymal pattern. The left breast
is larger than the right and this asymmetry is stable. I see no malignant
appearing grouping of microcalcification and no area of new architectural
distortion.
IMPRESSION: 1.I do not see findings suspicious for malignancy.

BI-RADS: Category 2 - Benign Findings
RECOMMENDATIONS:

1.     Please continue to encourage yearly mammographic follow-up.

A NEGATIVE MAMMOGRAM REPORT DOES NOT PRECLUDE BIOPSY OR OTHER EVALUATION OF
A CLINICALLY PALPABLE OR OTHERWISE SUSPICIOUS MASS OR LESION. BREAST CANCER
MAY NOT BE DETECTED BY MAMMOGRAPHY IN UP TO 10% OF CASES.

## 2011-07-07 ENCOUNTER — Encounter: Payer: Self-pay | Admitting: Internal Medicine

## 2011-07-18 ENCOUNTER — Ambulatory Visit (INDEPENDENT_AMBULATORY_CARE_PROVIDER_SITE_OTHER): Payer: PRIVATE HEALTH INSURANCE | Admitting: Internal Medicine

## 2011-07-18 ENCOUNTER — Encounter: Payer: Self-pay | Admitting: Internal Medicine

## 2011-07-18 VITALS — BP 110/64 | HR 70 | Temp 97.8°F | Resp 14 | Ht 62.0 in | Wt 119.8 lb

## 2011-07-18 DIAGNOSIS — K649 Unspecified hemorrhoids: Secondary | ICD-10-CM | POA: Insufficient documentation

## 2011-07-18 DIAGNOSIS — K5904 Chronic idiopathic constipation: Secondary | ICD-10-CM | POA: Insufficient documentation

## 2011-07-18 DIAGNOSIS — R110 Nausea: Secondary | ICD-10-CM

## 2011-07-18 DIAGNOSIS — Z1239 Encounter for other screening for malignant neoplasm of breast: Secondary | ICD-10-CM

## 2011-07-18 DIAGNOSIS — K5909 Other constipation: Secondary | ICD-10-CM

## 2011-07-18 NOTE — Progress Notes (Signed)
  Subjective:    Patient ID: Erica Maldonado, female    DOB: [birth year], 60 y.o.   MRN: [id number]  HPI here for her annual nongyn exam. Has been having episodes of post prandial nausea without vomiting for the past 6 months,  Intermittent, not daily.  No weight loss, reflux or dark stools.  She does note that her constipation has  improved with daily ingestion of 5 prunes.  Seh cominues to have mild but persistent lateral thoracic back pain on the left.  Pain is aggravated by sleep and bending, improved with use of a pillow under her knees at night.  No radiation.       Review of Systems  Constitutional: Negative for fever, chills, appetite change, fatigue and unexpected weight change.  HENT: Negative for ear pain, congestion, sore throat, trouble swallowing, neck pain, voice change and sinus pressure.   Eyes: Negative for visual disturbance.  Respiratory: Negative for cough, shortness of breath, wheezing and stridor.   Cardiovascular: Negative for chest pain, palpitations and leg swelling.  Gastrointestinal: Positive for abdominal pain. Negative for nausea, vomiting, diarrhea, constipation, blood in stool, abdominal distention and anal bleeding.  Genitourinary: Negative for dysuria and flank pain.  Musculoskeletal: Negative for myalgias, arthralgias and gait problem.  Skin: Negative for color change and rash.  Neurological: Negative for dizziness and headaches.  Hematological: Negative for adenopathy. Does not bruise/bleed easily.  Psychiatric/Behavioral: Negative for suicidal ideas, sleep disturbance and dysphoric mood. The patient is not nervous/anxious.        Objective:   Physical Exam  Constitutional: She is oriented to person, place, and time. She appears well-developed and well-nourished.  HENT:  Mouth/Throat: Oropharynx is clear and moist.  Eyes: EOM are normal. Pupils are equal, round, and reactive to light. No scleral icterus.  Neck: Normal range of motion. Neck supple. No JVD  present. No thyromegaly present.  Cardiovascular: Normal rate, regular rhythm, normal heart sounds and intact distal pulses.   Pulmonary/Chest: Effort normal and breath sounds normal.  Abdominal: Soft. Bowel sounds are normal. She exhibits no mass. There is tenderness. There is no rebound and no guarding.  Musculoskeletal: Normal range of motion. She exhibits no edema.  Lymphadenopathy:    She has no cervical adenopathy.  Neurological: She is alert and oriented to person, place, and time.  Skin: Skin is warm and dry.  Psychiatric: She has a normal mood and affect.          Assessment & Plan:  Post prandial nausea: with no signs of reflux, no regular use of NSAIDs, no overindulgence in alcohol, would like to rule out gallstones as cause.  [place] ordered.   Screenign for breast CA:  She is due for mammogram.  Screening for cervical CA:  Regular PAPs are done by her gynecologist, Dr. Luella Cook.   Screenign for colon CA:  Her last colonoscopy was normal at age 2 by Dr. Mechele Collin.  She is conisderin [employer] do it in office.  Will let me know.

## 2011-08-25 ENCOUNTER — Telehealth: Payer: Self-pay | Admitting: Internal Medicine

## 2011-08-25 NOTE — Telephone Encounter (Signed)
Pt had abd ultra sound done wednesday and would like the results [facility] in [place]  Best phone # to reach her today is [phone]

## 2011-08-28 ENCOUNTER — Encounter: Payer: Self-pay | Admitting: Internal Medicine

## 2011-08-28 ENCOUNTER — Telehealth: Payer: Self-pay | Admitting: Internal Medicine

## 2011-08-28 NOTE — Telephone Encounter (Signed)
Notified patient ultrasound is normal per results that are scanned in her chart.

## 2011-08-28 NOTE — Telephone Encounter (Signed)
°  Kizia at Dr Lemar Livings office called she would like the abd ultra sound results for Collene Schlichter she had this done in [place] her fax and phone number below pt has appointment with them this afternoon  Fax number [phone] phone [phone]

## 2011-08-28 NOTE — Telephone Encounter (Signed)
Results have been faxed

## 2011-08-31 ENCOUNTER — Telehealth: Payer: Self-pay | Admitting: Internal Medicine

## 2011-08-31 ENCOUNTER — Ambulatory Visit: Payer: Self-pay | Admitting: Internal Medicine

## 2011-08-31 DIAGNOSIS — R110 Nausea: Secondary | ICD-10-CM

## 2011-08-31 IMAGING — MG MM CAD SCREENING MAMMO
1 series · 5 of 5 positions shown · non-contrast
Comparison: none

REASON FOR EXAM: SCR MAMMO NO ORDER CAT 2
COMMENTS:

PROCEDURE:     MAM - MAM DGTL SCRN MAM NO ORDER W/CAD  - [DATE]  [DATE]
RESULT:     COMPARISON:  [DATE], [DATE], [DATE], [DATE]
TECHNIQUE: Digital screening mammograms were obtained. FDA approved
computer-aided detection (CAD) for mammography was utilized for this study.

[Series 4449: R CC · right · 5 of 5 slices shown]
[im 1/5]
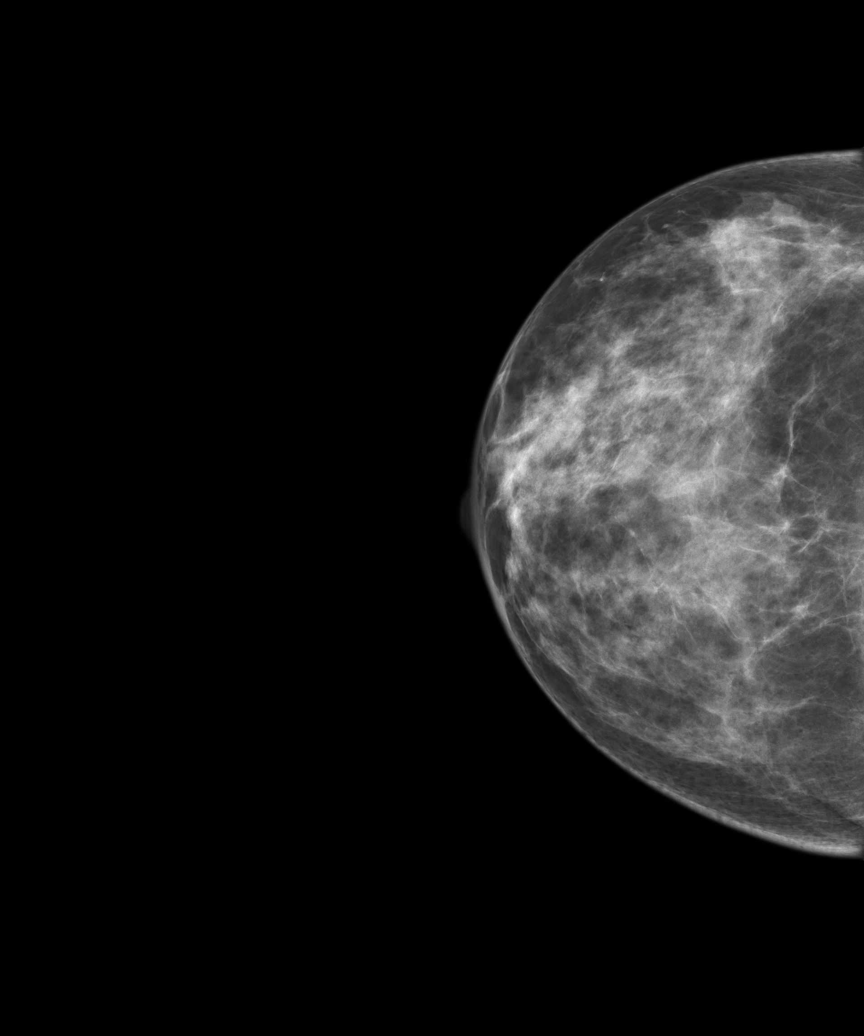
[im 2/5]
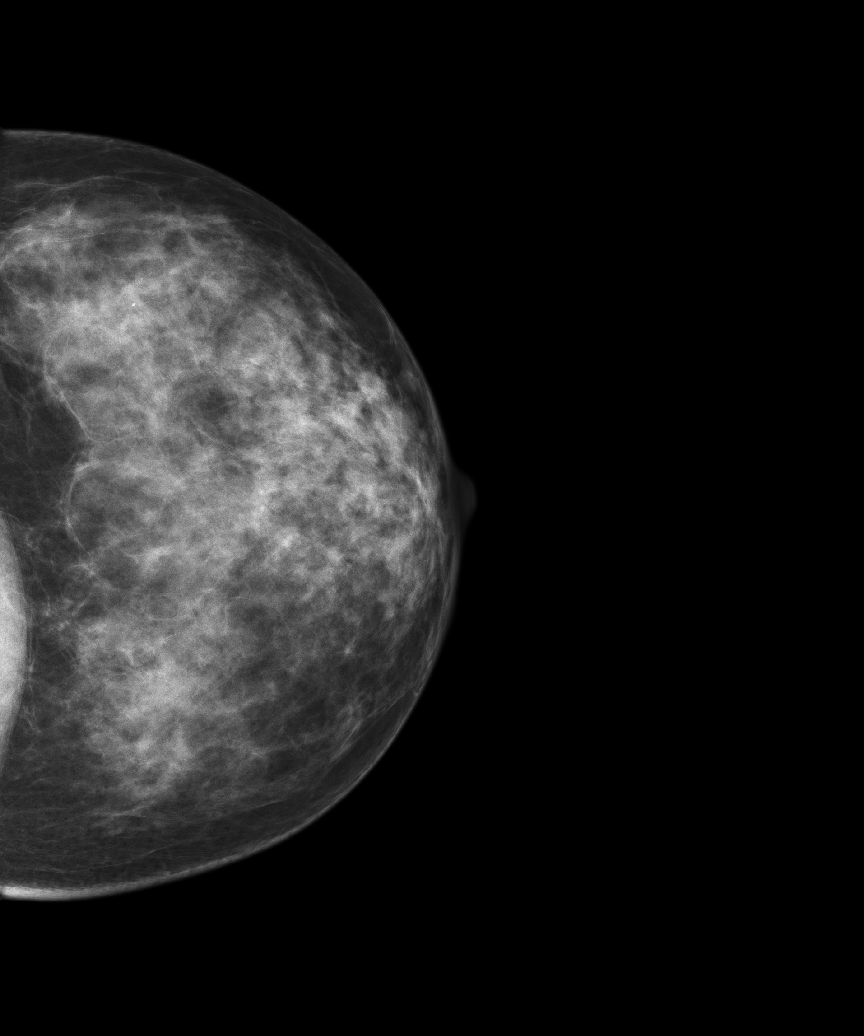
[im 3/5]
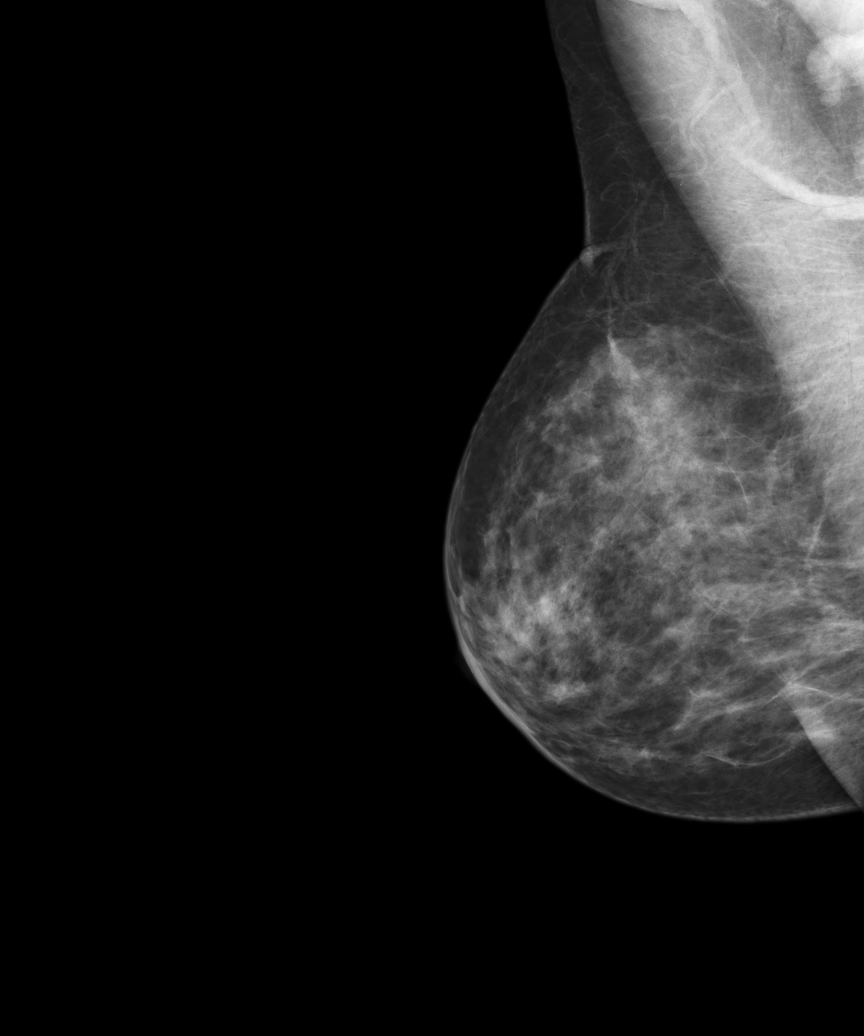
[im 4/5]
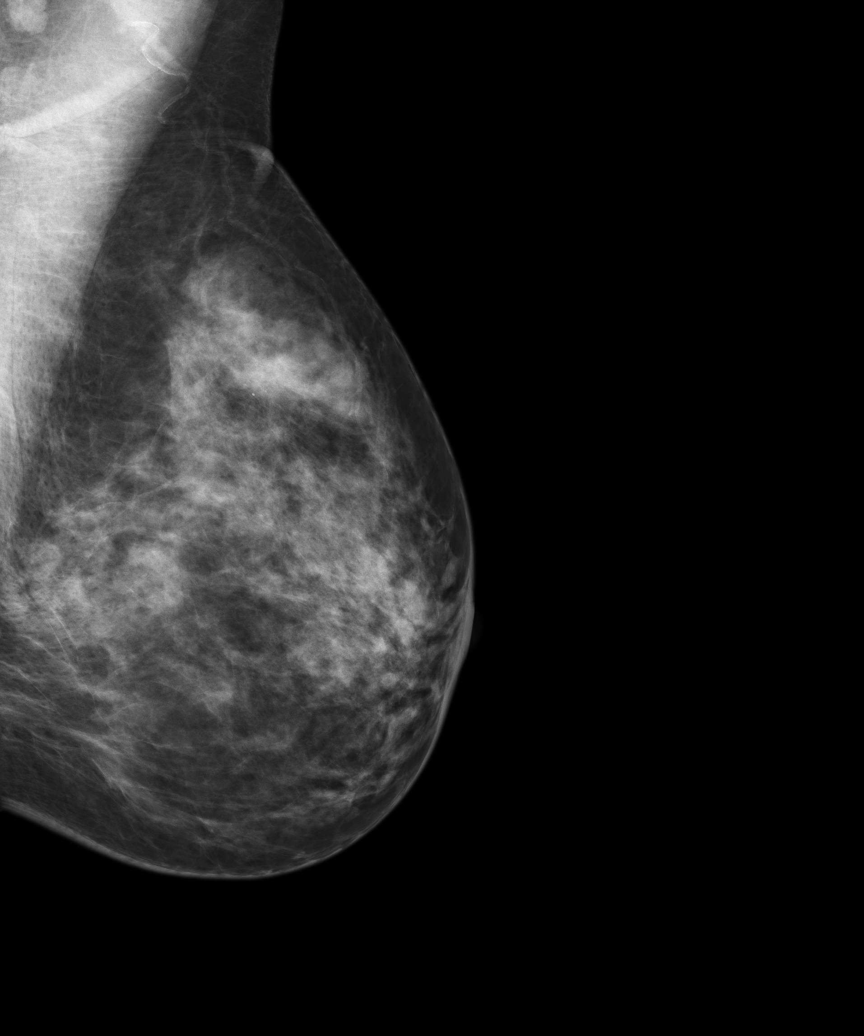
[im 5/5]
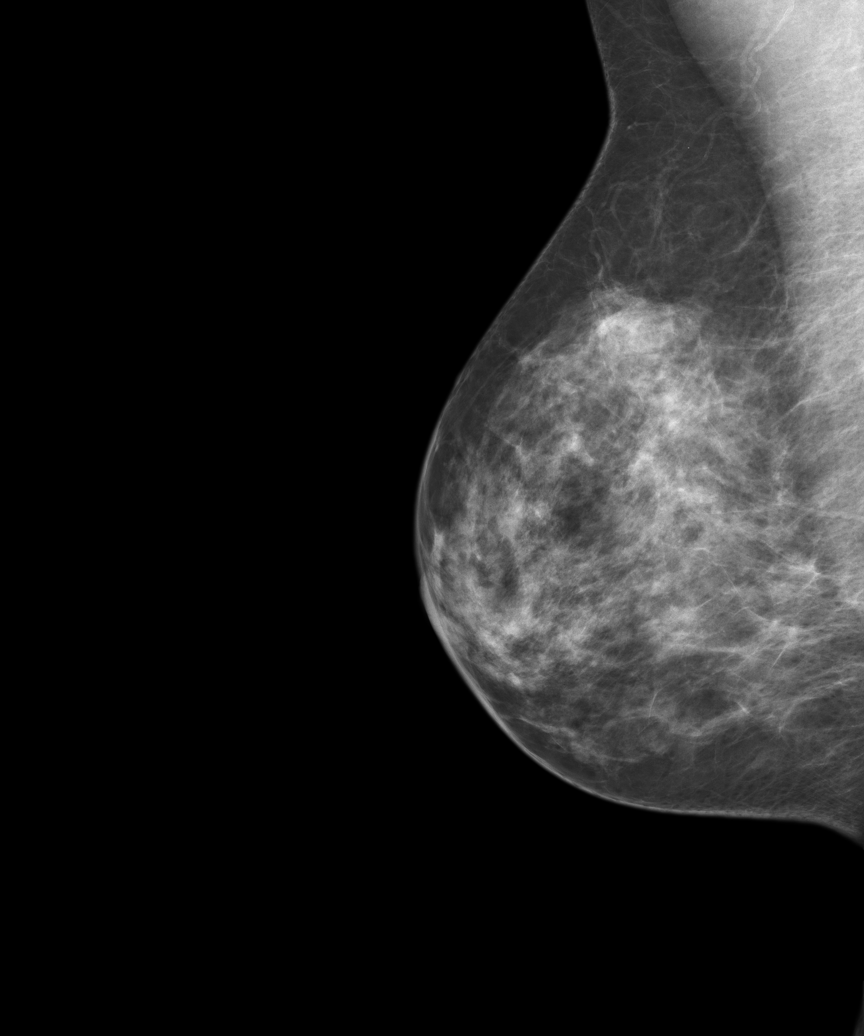

[5 of 5 positions shown; findings below may reference images not displayed]

FINDING: Bilateral breasts are heterogeneously dense which may lower the sensitivity
of mammography. There is asymmetry in the upper left breast without
corresponding abnormality on the cc view. This likely represents a summation
of shadows, but further evaluation with spot compression views is
recommended. There is no dominant mass, architectural distortion or clusters
of suspicious microcalcifications.
IMPRESSION: 1.     There is asymmetry in the upper left breast without corresponding
abnormality on the cc view. This likely represents a summation of shadows,
but further evaluation with spot compression views is recommended.

BI-RADS:  Category 0 - Needs Additional Imaging Evaluation

A negative mammogram report does not preclude biopsy or other evaluation of
a clinically palpable or otherwise suspicious mass or lesion. Breast cancer
may not be detected by mammography in up to 10% of cases.

## 2011-08-31 NOTE — Telephone Encounter (Signed)
Cell [phone]  Pt would like you to call her about her ultrasound she know it came back normal, but she has a couple of questions on what to do next

## 2011-09-01 ENCOUNTER — Ambulatory Visit: Payer: Self-pay | Admitting: Internal Medicine

## 2011-09-01 IMAGING — MG MM ADDITIONAL VIEWS AT NO CHARGE
1 series · 2 of 2 positions shown · non-contrast
Comparison: none

REASON FOR EXAM: AV LT ASYMMETRY
COMMENTS:

PROCEDURE:     MAM - MAM DGTL ADD VW LT  SCR  - [DATE]  [DATE]
RESULT:     TECHNIQUE: Digital diagnostic mammograms were obtained. FDA
approved computer-aided detection (CAD) for mammography was utilized for
this study.

[Series 5316: L ML · left · 2 of 2 slices shown]
[im 1/2]
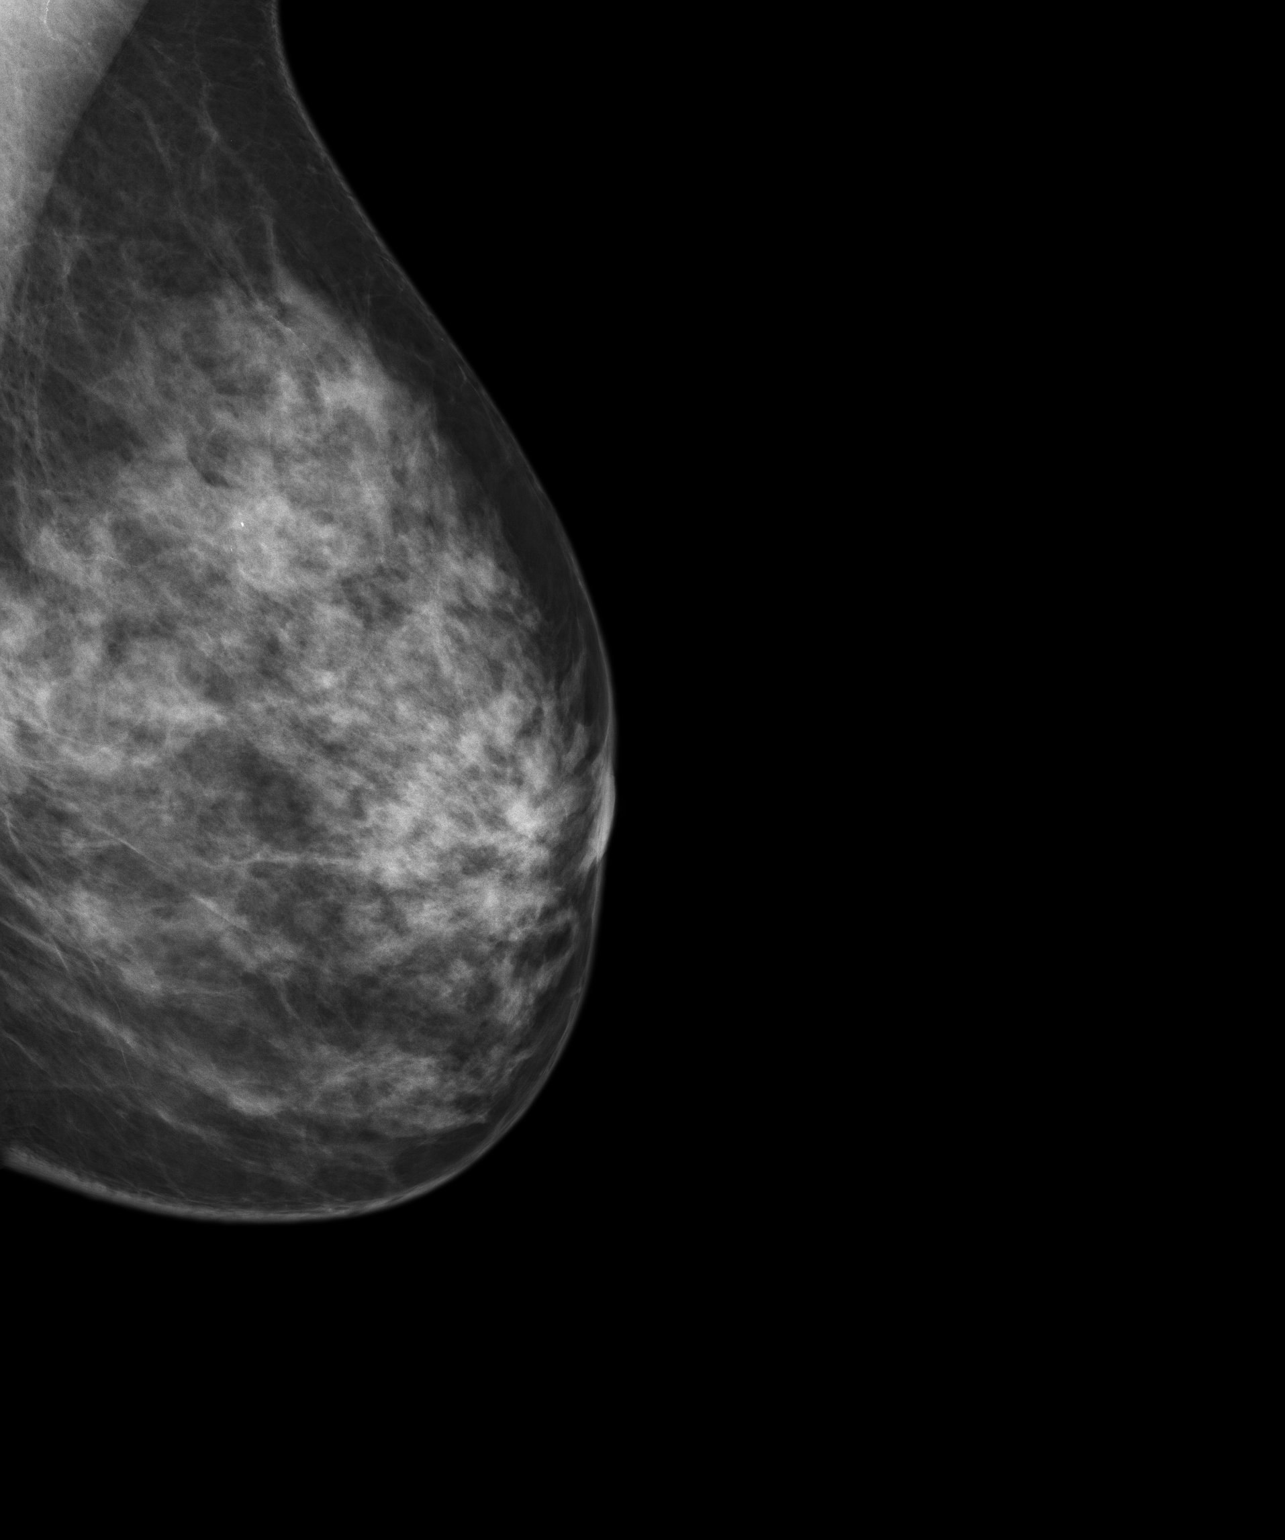
[im 2/2]
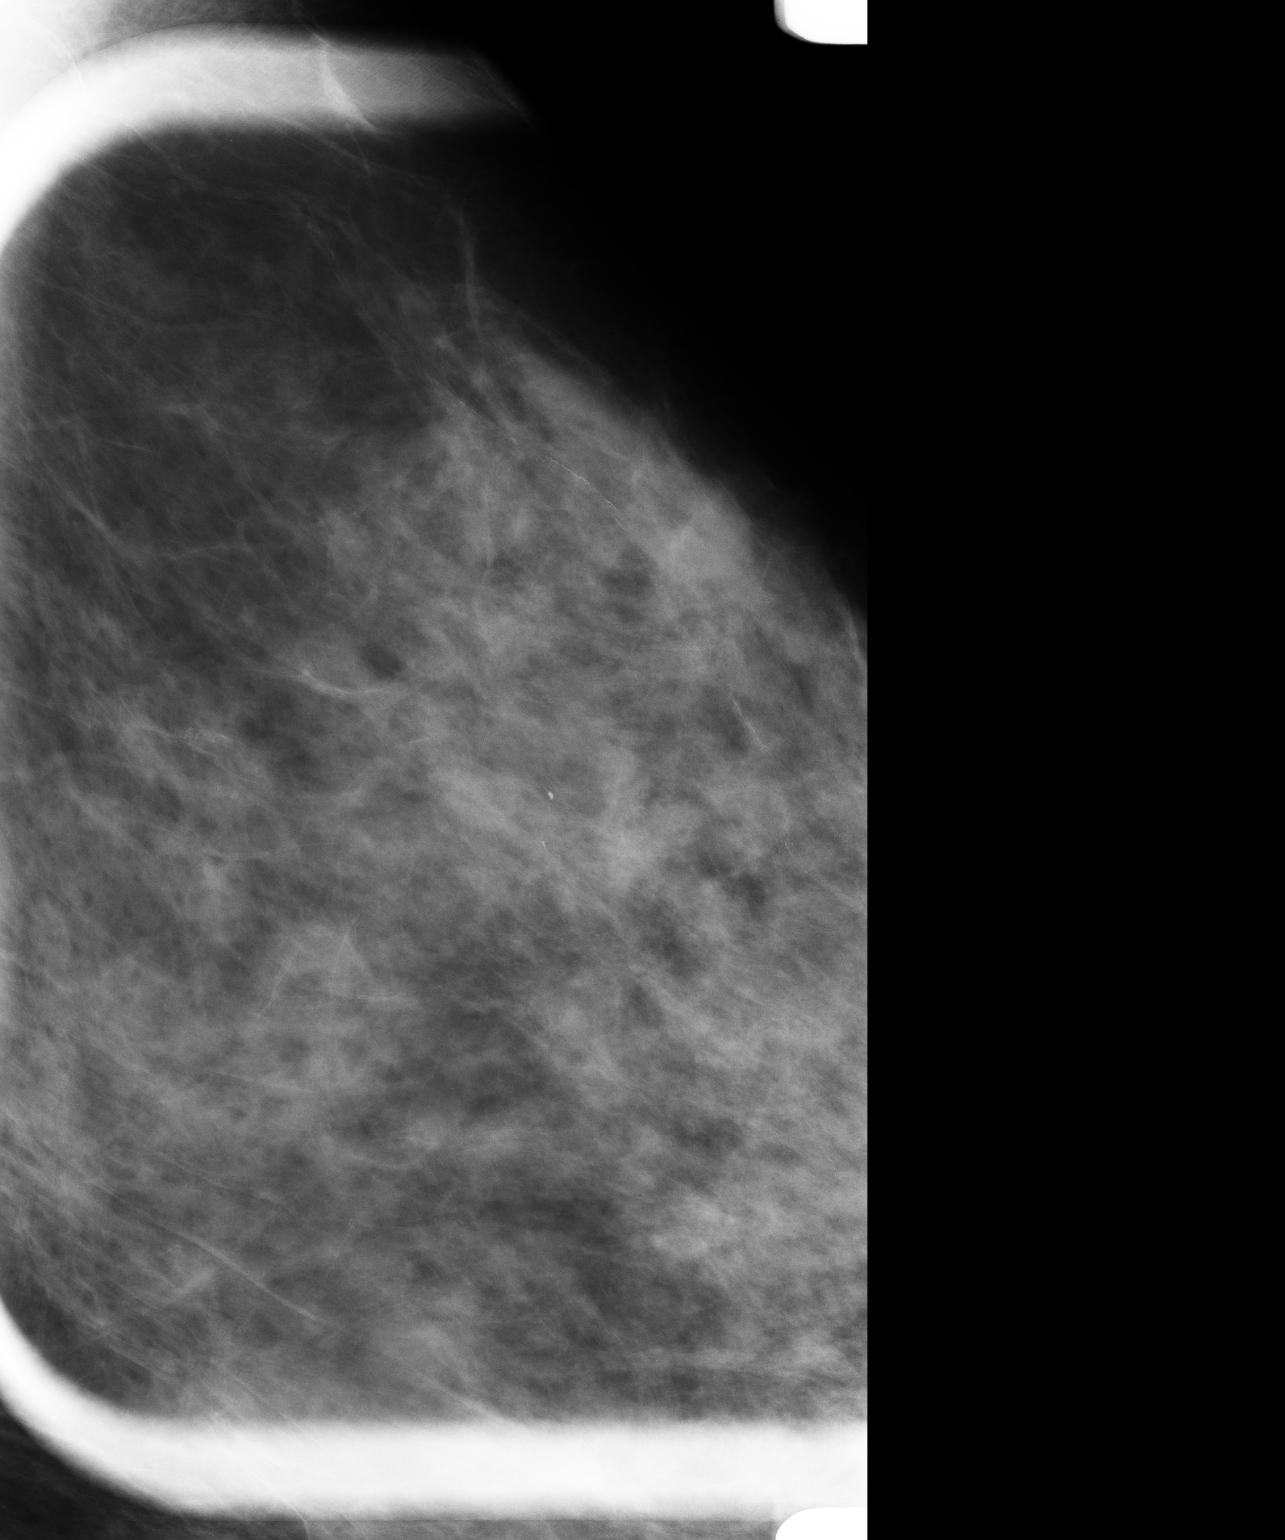

[2 of 2 positions shown; findings below may reference images not displayed]

FINDING: True lateral view and spot compression views of the upper left breast were
performed. The area of asymmetry compresses and blends in with the adjacent
right glandular tissue without a persistent mass or architectural distortion.
IMPRESSION: 1.     Negative left breast mammogram.
2.     Annual mammographic follow up recommended.
3.     BI-RADS:  Category 2- Benign.

A negative mammogram report does not preclude biopsy or other evaluation of
a clinically palpable or otherwise suspicious mass or lesion. Breast cancer
may not be detected by mammography in up to 10% of cases.

## 2011-09-06 ENCOUNTER — Ambulatory Visit (INDEPENDENT_AMBULATORY_CARE_PROVIDER_SITE_OTHER): Payer: PRIVATE HEALTH INSURANCE | Admitting: Internal Medicine

## 2011-09-06 ENCOUNTER — Encounter: Payer: Self-pay | Admitting: Internal Medicine

## 2011-09-06 DIAGNOSIS — H6690 Otitis media, unspecified, unspecified ear: Secondary | ICD-10-CM

## 2011-09-06 DIAGNOSIS — R1011 Right upper quadrant pain: Secondary | ICD-10-CM | POA: Insufficient documentation

## 2011-09-06 NOTE — Progress Notes (Signed)
  Subjective:    Patient ID: Erica Maldonado, female    DOB: [birth year], 60 y.o.   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  She is now experiencing a feeling of a foreign body in the ear canal and tenderness behind the ear and in front f the ear.  No recent travel or swimming;  No recent URIs  Past Medical History  Diagnosis Date  . Scleroderma   . Epistaxis     s/p cauterization  . Fibroadenoma     right breast  . Hemorrhoids, external, without mention of complication   . Abdominal or pelvic swelling, mass, or lump, left lower quadrant   . Umbilical hernia without mention of obstruction or gangrene   . Infectious diarrhea     history of  . Other chest pain   . Other bursitis disorders   . Sleep disturbance, unspecified    Current Outpatient Prescriptions on File Prior to Visit  Medication Sig Dispense Refill  . conjugated estrogens (PREMARIN) vaginal cream Use intravaginally 3 times weekly.       . Wheat Dextrin (BENEFIBER DRINK MIX PO) Take one tsp by mouth every morning.         Review of Systems  Constitutional: Negative for fever, chills and unexpected weight change.  HENT: Positive for ear pain and ear discharge. Negative for hearing loss, nosebleeds, congestion, sore throat, facial swelling, rhinorrhea, sneezing, mouth sores, trouble swallowing, neck pain, neck stiffness, voice change, postnasal drip, sinus pressure and tinnitus.   Eyes: Negative for pain, discharge, redness and visual disturbance.  Respiratory: Negative for cough, chest tightness, shortness of breath, wheezing and stridor.   Cardiovascular: Negative for chest pain, palpitations and leg swelling.  Musculoskeletal: Negative for myalgias and arthralgias.  Skin: Negative for color change and rash.  Neurological: Negative for dizziness, weakness, light-headedness and headaches.  Hematological:  Negative for adenopathy.       Objective:   Physical Exam  Constitutional: She is oriented to person, place, and time. She appears well-developed and well-nourished.  HENT:  Left Ear: There is swelling and tenderness. There is mastoid tenderness. Tympanic membrane is erythematous. Tympanic membrane mobility is abnormal.  Mouth/Throat: Oropharynx is clear and moist.  Eyes: EOM are normal. Pupils are equal, round, and reactive to light. No scleral icterus.  Neck: Normal range of motion. Neck supple. No JVD present. No thyromegaly present.  Cardiovascular: Normal rate, regular rhythm, normal heart sounds and intact distal pulses.   Pulmonary/Chest: Effort normal and breath sounds normal.  Abdominal: Soft. Bowel sounds are normal. She exhibits no mass. There is no tenderness.  Musculoskeletal: Normal range of motion. She exhibits no edema.  Lymphadenopathy:    She has no cervical adenopathy.  Neurological: She is alert and oriented to person, place, and time.  Skin: Skin is warm and dry.  Psychiatric: She has a normal mood and affect.          Assessment & Plan:

## 2011-09-06 NOTE — Patient Instructions (Signed)
Use the amoxicillin 3 times daily for one week.   Add Sudafed PE 10  Mg every 8 hours for decongesting the eardrum.   Return in one week for a check.

## 2011-09-06 NOTE — Assessment & Plan Note (Signed)
Amoxicillin and sudafed PE.

## 2011-09-06 NOTE — Assessment & Plan Note (Signed)
nonrevealing gallbladder by s/u.  Sending fro [facility] scan

## 2011-09-28 ENCOUNTER — Telehealth: Payer: Self-pay | Admitting: Internal Medicine

## 2011-09-28 ENCOUNTER — Encounter: Payer: Self-pay | Admitting: Internal Medicine

## 2011-09-28 NOTE — Telephone Encounter (Signed)
The gallbladder scan was normal

## 2011-09-29 NOTE — Telephone Encounter (Signed)
Notified patient of results 

## 2011-10-07 ENCOUNTER — Telehealth: Payer: Self-pay | Admitting: Internal Medicine

## 2011-10-07 NOTE — Telephone Encounter (Signed)
Her lipids are excellent.  Thank her for sending me a copy since [facility] didn't

## 2011-10-09 NOTE — Telephone Encounter (Signed)
Notified patient of results 

## 2011-10-18 ENCOUNTER — Encounter: Payer: Self-pay | Admitting: Internal Medicine

## 2011-12-05 ENCOUNTER — Ambulatory Visit: Payer: Self-pay | Admitting: General Surgery

## 2011-12-08 LAB — PATHOLOGY REPORT

## 2012-01-25 ENCOUNTER — Encounter: Payer: Self-pay | Admitting: Internal Medicine

## 2012-07-24 ENCOUNTER — Telehealth: Payer: Self-pay | Admitting: *Deleted

## 2012-07-24 ENCOUNTER — Telehealth: Payer: Self-pay | Admitting: Internal Medicine

## 2012-07-24 NOTE — Telephone Encounter (Signed)
Patient is aware of appointment at [facility] on 10.15.13 @8 :00.

## 2012-07-24 NOTE — Telephone Encounter (Signed)
Patient returned call and stated that this has already been handled. Mammogram has been scheduled.

## 2012-07-24 NOTE — Telephone Encounter (Signed)
Patient left message on 07-23-12 stating that she needs order for mammogram, she is due in October, but she did not state where she has this done at. I tried calling her and left her a message asking her to call and let us know where she has her mammograms done at.

## 2012-09-03 ENCOUNTER — Ambulatory Visit: Payer: Self-pay | Admitting: Internal Medicine

## 2012-09-03 IMAGING — MG MM CAD SCREENING MAMMO
1 series · 4 of 4 positions shown · non-contrast
Comparison: none

REASON FOR EXAM: SCR MAMMO NO ORDER CAT 2
COMMENTS:

[R CC · right · 4 of 4 slices shown]
[im 1/4]
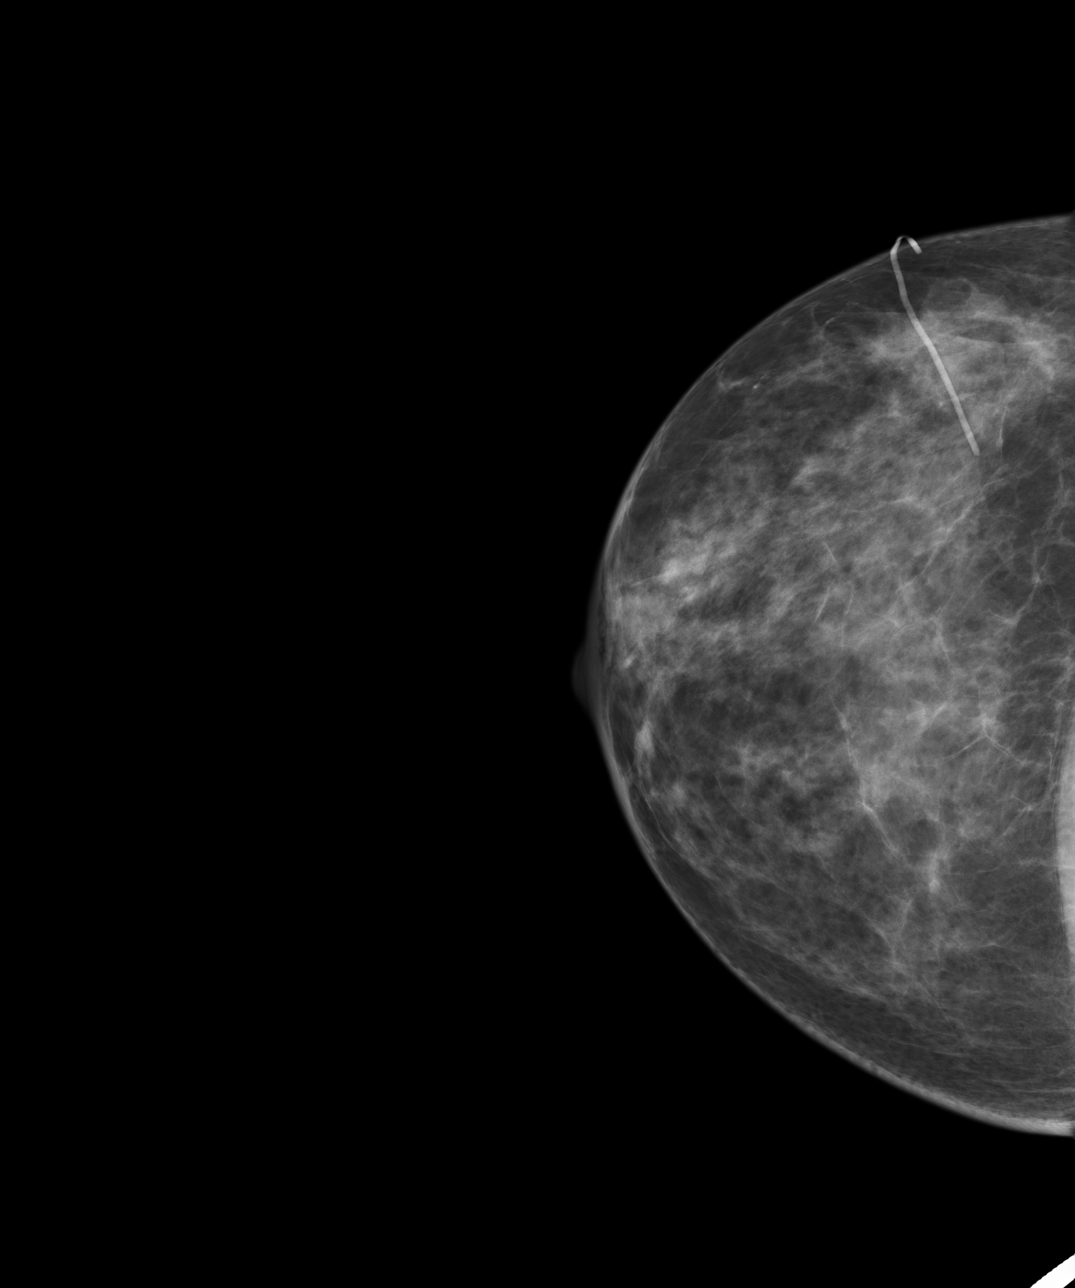
[im 2/4]
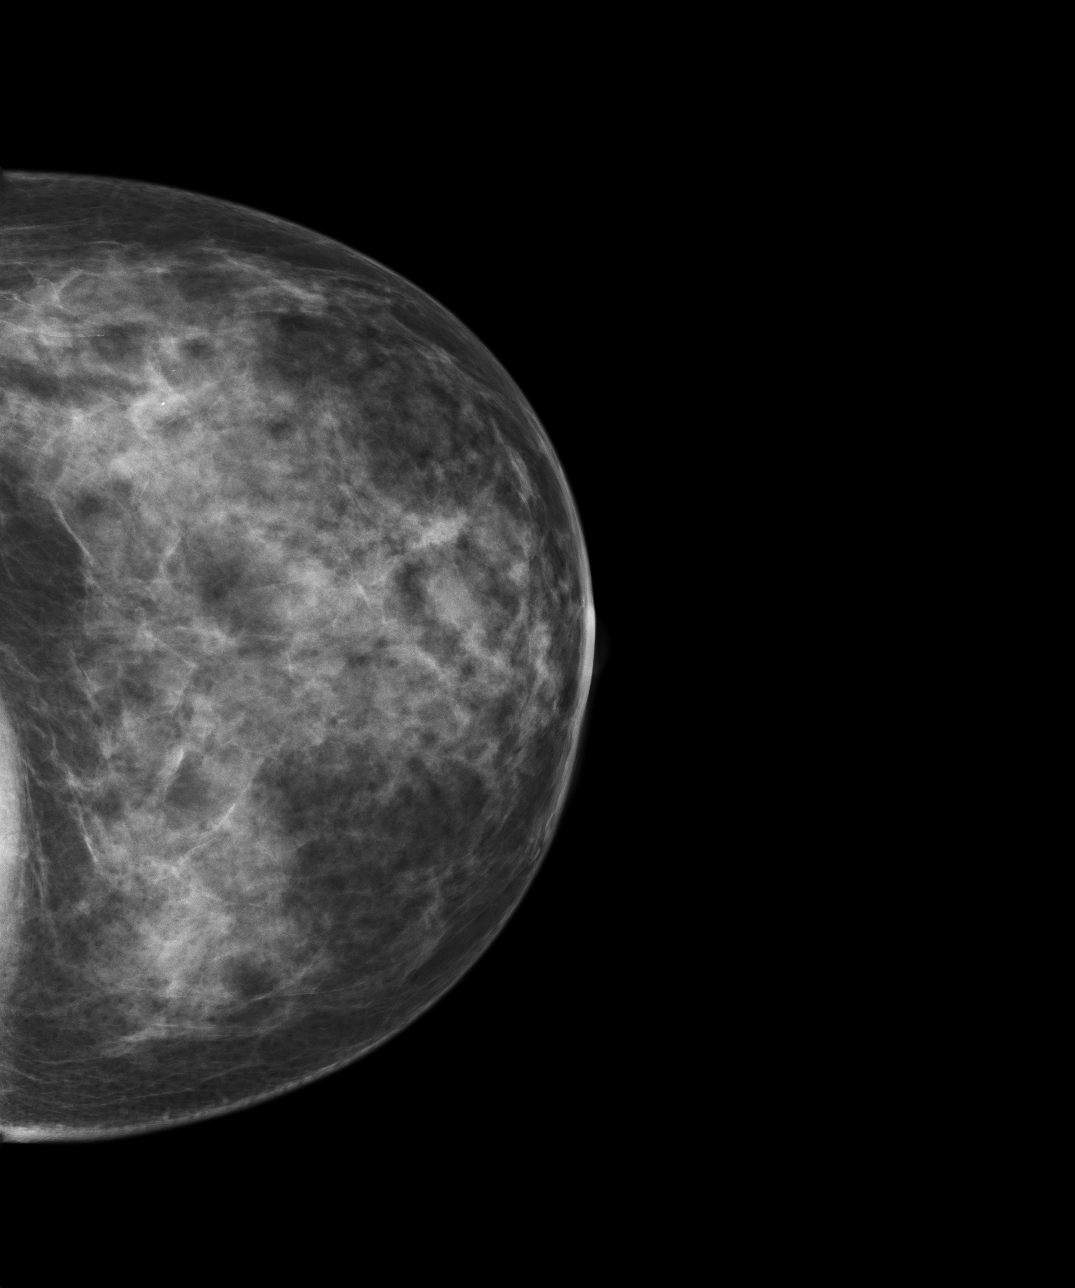
[im 3/4]
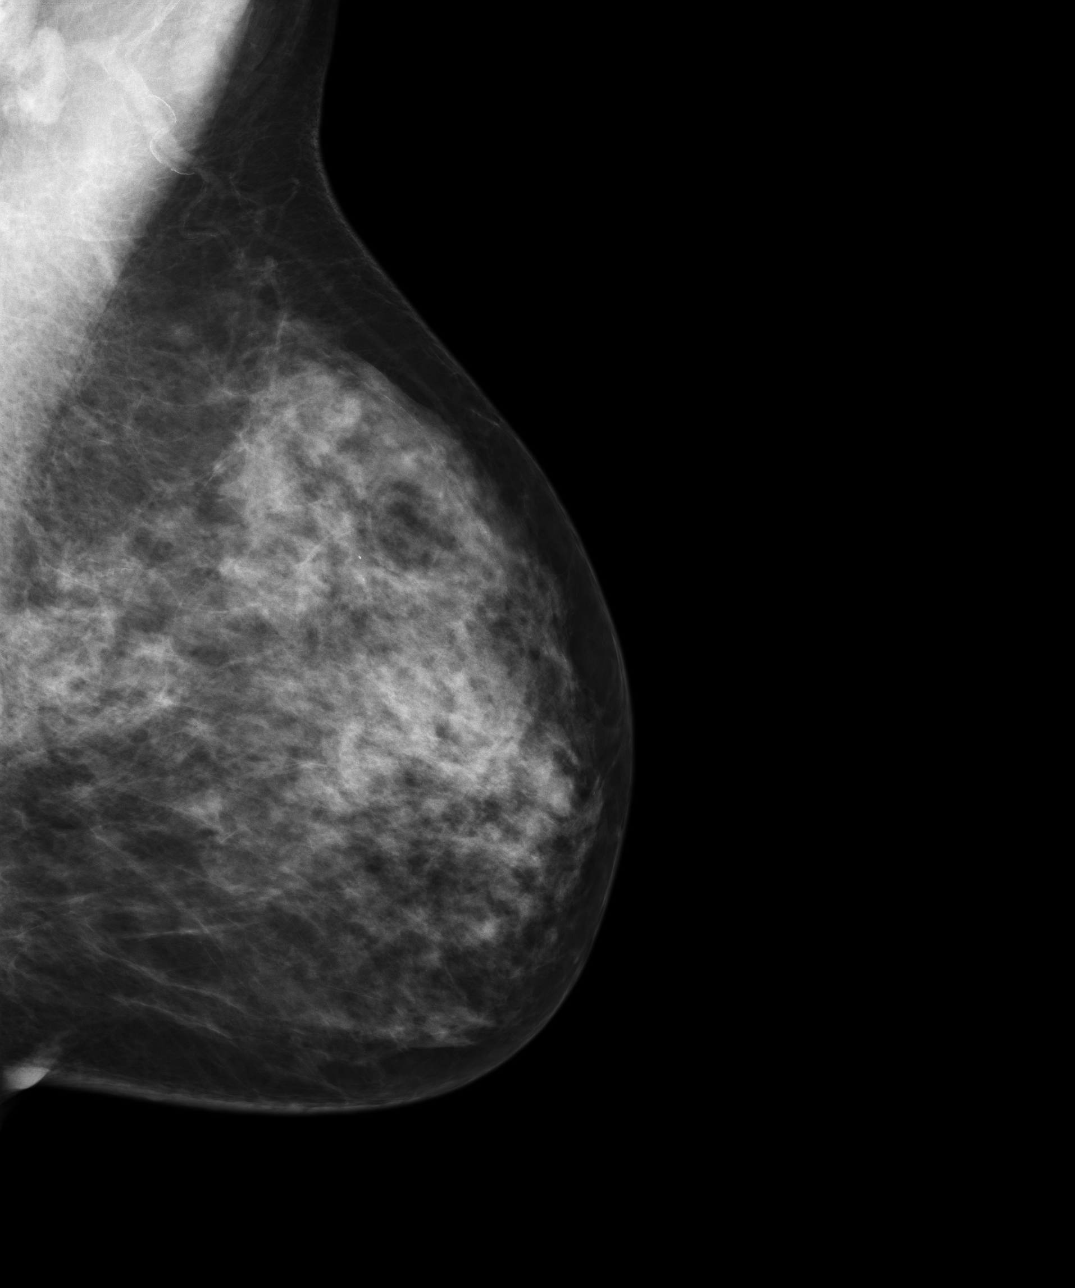
[im 4/4]
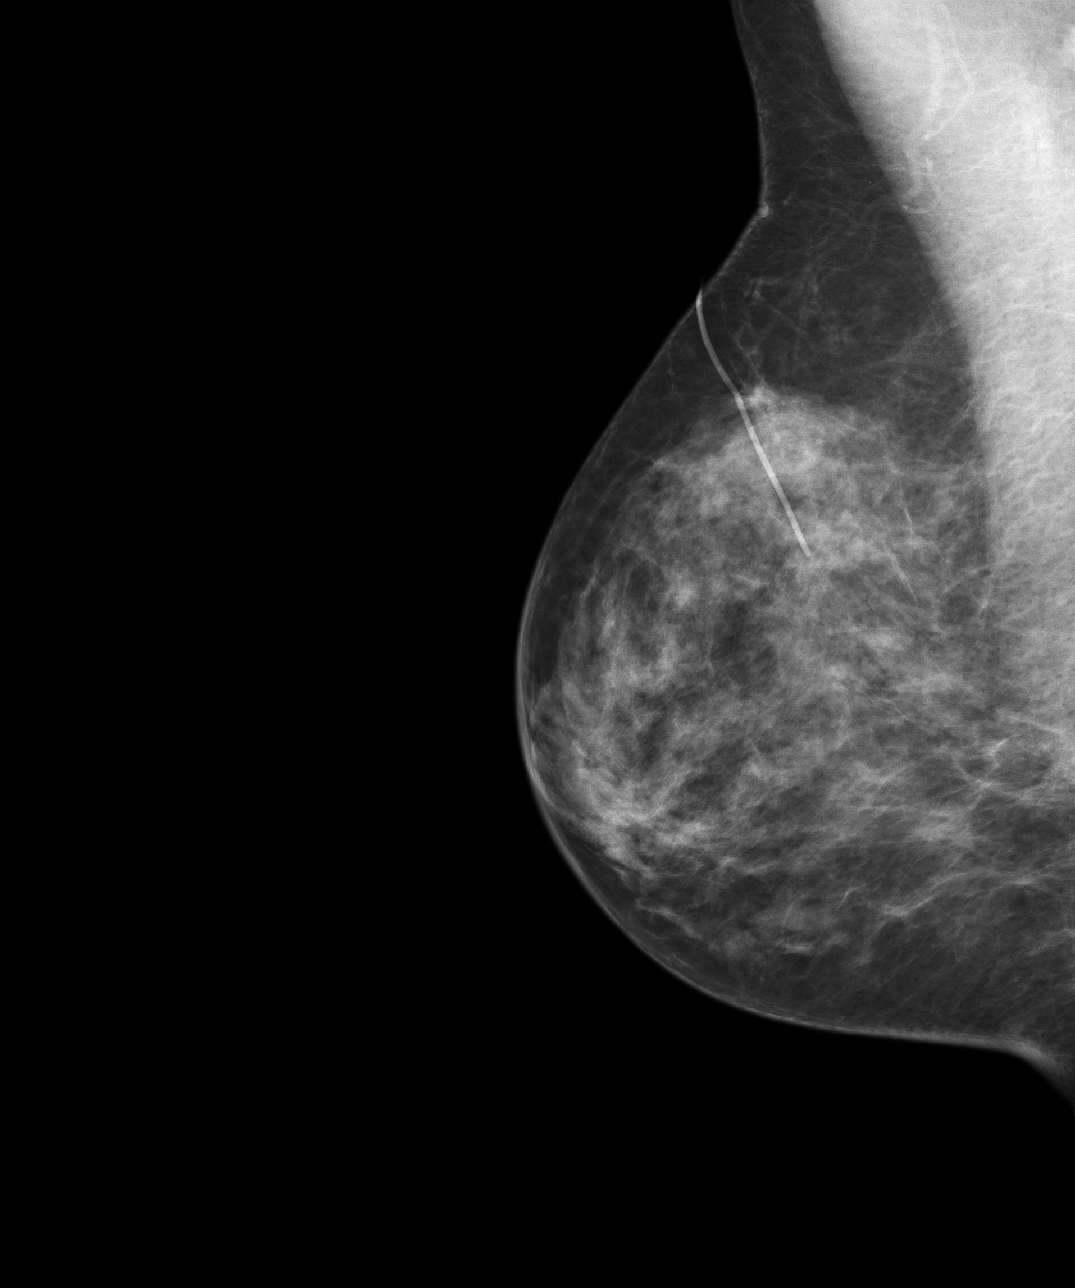

[4 of 4 positions shown; findings below may reference images not displayed]

PROCEDURE:     MAM - MAM DGTL SCRN MAM NO ORDER W/CAD  - [DATE]  [DATE]

RESULT:     Comparison is made to previous digital studies [DATE],[DATE],[DATE], and [DATE].

The breasts exhibit a heterogeneously dense parenchymal pattern. The right
breast is smaller than the left and this asymmetry is stable. A surgical
scar marker has been placed superolaterally on the right. Benign-appearing
lymph nodes are noted in the left axillary region. There is no dominant mass
within either breast and there are no malignant appearing groupings of
microcalcification.
IMPRESSION: There are no findings suspicious for malignancy.

BI-RADS 2: benign findings.

Recommendation: please continue to encourage yearly mammographic followup.

A NEGATIVE MAMMOGRAM REPORT DOES NOT PRECLUDE BIOPSY OR OTHER EVALUATION OF
A CLINICALLY PALPABLE OR OTHERWISE SUSPICIOUS MASS OR LESION. BREAST CANCER
MAY NOT BE DETECTED BY MAMMOGRAPHY IN UP TO 10% OF CASES.

Dictation site:1

## 2012-09-10 ENCOUNTER — Encounter: Payer: Self-pay | Admitting: Internal Medicine

## 2012-12-02 ENCOUNTER — Encounter: Payer: Self-pay | Admitting: Adult Health

## 2012-12-02 ENCOUNTER — Ambulatory Visit (INDEPENDENT_AMBULATORY_CARE_PROVIDER_SITE_OTHER): Payer: PRIVATE HEALTH INSURANCE | Admitting: Adult Health

## 2012-12-02 VITALS — BP 133/81 | HR 77 | Temp 98.1°F | Resp 14 | Ht 64.0 in | Wt 116.0 lb

## 2012-12-02 DIAGNOSIS — H6120 Impacted cerumen, unspecified ear: Secondary | ICD-10-CM

## 2012-12-02 DIAGNOSIS — J329 Chronic sinusitis, unspecified: Secondary | ICD-10-CM

## 2012-12-02 DIAGNOSIS — H6123 Impacted cerumen, bilateral: Secondary | ICD-10-CM | POA: Insufficient documentation

## 2012-12-02 MED ORDER — HYDROCODONE-HOMATROPINE 5-1.5 MG/5ML PO SYRP: 5.0000 mL | ORAL_SOLUTION | Freq: Three times a day (TID) | ORAL | Status: DC | PRN

## 2012-12-02 MED ORDER — FLUTICASONE PROPIONATE 50 MCG/ACT NA SUSP: NASAL | Status: DC

## 2012-12-02 NOTE — Patient Instructions (Addendum)
  Continue the azithromycin.  Start flonase 2 sprays in each nostril daily.  Flush sinuses daily. You can use a simple saline spray that is sold OTC.  Drink fluids to stay hydrated.  Hycodan for the cough. This medication may make your drowsy.

## 2012-12-02 NOTE — Assessment & Plan Note (Signed)
Started Azithromycin this past Saturday. Ordered Flonase 2 sprays in each nostril daily. Saline nose spray for irrigation. Hycodan for cough. RTC if no resolution of symptoms within 3-4 days.

## 2012-12-02 NOTE — Assessment & Plan Note (Signed)
Irrigation of bilateral ears. May use vinegar and water solution in bilateral ears 30 min prior to shower. May do this 3 times weekly then decrease to 1 time per week to keep cerumen from building up to form impaction.

## 2012-12-02 NOTE — Progress Notes (Signed)
  Subjective:    Patient ID: Erica Maldonado, female    DOB: 1951/09/14, 62 y.o.   MRN: [id number]  HPI  Patient is a very pleasant 62 y/o female who presents to clinic today with c/o scratchy throat, bilateral ear feeling of fullness/popping, congestion, cough that has kept her up at night and fever of 101. Patient has been taking OTC decongestant. These symptoms have been ongoing since Saturday, January 4th. She reports that a family friend who is a physician prescribed her a Z-Pack this past Saturday. Fever broke by Sunday. Patient reports feeling improvement; however, the cough and congestion is still quite bothersome.  Current Outpatient Prescriptions on File Prior to Visit  Medication Sig Dispense Refill  . Wheat Dextrin (BENEFIBER DRINK MIX PO) Take one tsp by mouth every morning.       . conjugated estrogens (PREMARIN) vaginal cream Use intravaginally 3 times weekly.       . fluticasone (FLONASE) 50 MCG/ACT nasal spray 2 sprays in each nostril daily.  16 g  6     Review of Systems  Constitutional: Positive for fever and chills.  HENT: Positive for congestion, sore throat, rhinorrhea, postnasal drip and sinus pressure.   Eyes: Negative.   Respiratory: Positive for cough. Negative for shortness of breath and wheezing.   Cardiovascular: Negative.   Gastrointestinal: Negative.   Genitourinary: Negative.   Musculoskeletal: Negative.   Neurological: Negative.   Psychiatric/Behavioral: Negative.     BP 133/81  Pulse 77  Temp 98.1 F (36.7 C) (Oral)  Resp 14  Ht 5\' 4"  (1.626 m)  Wt 116 lb (52.617 kg)  BMI 19.91 kg/m2  SpO2 98%     Objective:   Physical Exam  Constitutional: She is oriented to person, place, and time. She appears well-developed and well-nourished. No distress.  HENT:  Head: Normocephalic and atraumatic.       Bilateral ears with cerumen buildup and flaky skin in canal. TM are not fully visualized but appear grey, translucent. Pharyngeal erythema. Post nasal  drip  Eyes: Conjunctivae normal are normal. Right eye exhibits no discharge. Left eye exhibits no discharge.  Neck: Normal range of motion. No tracheal deviation present.  Cardiovascular: Normal rate, regular rhythm and normal heart sounds.  Exam reveals no gallop.   No murmur heard. Pulmonary/Chest: Effort normal and breath sounds normal. She has no wheezes. She has no rales.  Abdominal: Soft. Bowel sounds are normal.  Musculoskeletal: Normal range of motion.  Lymphadenopathy:    She has no cervical adenopathy.  Neurological: She is alert and oriented to person, place, and time. No cranial nerve deficit. Coordination normal.  Skin: Skin is warm and dry.  Psychiatric: She has a normal mood and affect. Her behavior is normal. Judgment and thought content normal.          Assessment & Plan:

## 2013-05-20 ENCOUNTER — Encounter: Payer: Self-pay | Admitting: Internal Medicine

## 2013-05-20 ENCOUNTER — Ambulatory Visit (INDEPENDENT_AMBULATORY_CARE_PROVIDER_SITE_OTHER): Payer: PRIVATE HEALTH INSURANCE | Admitting: Internal Medicine

## 2013-05-20 VITALS — BP 140/88 | HR 70 | Temp 97.9°F | Resp 14 | Wt 120.0 lb

## 2013-05-20 DIAGNOSIS — R252 Cramp and spasm: Secondary | ICD-10-CM

## 2013-05-20 DIAGNOSIS — R1011 Right upper quadrant pain: Secondary | ICD-10-CM

## 2013-05-20 DIAGNOSIS — K5909 Other constipation: Secondary | ICD-10-CM

## 2013-05-20 DIAGNOSIS — R079 Chest pain, unspecified: Secondary | ICD-10-CM

## 2013-05-20 DIAGNOSIS — K5904 Chronic idiopathic constipation: Secondary | ICD-10-CM

## 2013-05-20 DIAGNOSIS — R141 Gas pain: Secondary | ICD-10-CM

## 2013-05-20 DIAGNOSIS — R52 Pain, unspecified: Secondary | ICD-10-CM

## 2013-05-20 DIAGNOSIS — R140 Abdominal distension (gaseous): Secondary | ICD-10-CM

## 2013-05-20 NOTE — Progress Notes (Signed)
Patient ID: Erica Maldonado, female   DOB: December 02, 1950, 62 y.o.   MRN: [id number]  Patient Active Problem List   Diagnosis Date Noted  . Abdominal bloating 05/21/2013  . Abdominal pain, acute, right upper quadrant 09/06/2011  . Hemorrhoids 07/18/2011  . Constipation - functional 07/18/2011    Subjective:  CC:   Chief Complaint  Patient presents with  . Acute Visit    Chest discomfort , neck pain, numbness left arm. Intermittenly over several weeks.    HPI:   Erica Maldonado a 62 y.o. female who presents Recurrent substernal chest discomfort and abdominal bloating.  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.  Has been engaging in very strenouous work ripping up carpet,  Etc. And has been increasing her workouts at the gyn Mon thru Friday  Doing a 5:30 am class and not sleepign more than 5 to6 hours nightly.  By June felt she was overdoing it,  Not getting enough sleep due to late night phone talks with daughters. Symptoms aggravated by eating a  very rich dinner at the [facility]  [employer], and since then has felt lousy and bloated afterward,  Since then has had a dull ache in belly and throat. Symptoms are  Intermittent.and never occur with exertion.Has been drinking  4 to 5  24 ounce bottles of water daily and developing frequent  leg cramps.   Chronic constipation:  She has stopped using bulk forming laxatives and stool remains soft for the most part.    Past Medical History  Diagnosis Date  . Scleroderma   . Epistaxis     s/p cauterization  . Fibroadenoma     right breast  . Hemorrhoids, external, without mention of complication   . Abdominal or pelvic swelling, mass, or lump, left lower quadrant   . Umbilical hernia without mention of obstruction or gangrene   . Infectious diarrhea(009.2)     history of  . Other chest pain   . Other bursitis disorders   . Sleep disturbance, unspecified     Past  Surgical History  Procedure Laterality Date  . Hysteroscopy  1995    normal       The following portions of the patient's history were reviewed and updated as appropriate: Allergies, current medications, and problem list.    Review of Systems:   12 Pt  review of systems was negative except those addressed in the HPI,     History   Social History  . Marital Status: Married    Spouse Name: N/A    Number of Children: N/A  . Years of Education: N/A   Occupational History  . nurse- full time    Social History Main Topics  . Smoking status: Never Smoker   . Smokeless tobacco: Never Used     Comment: Remote history  . Alcohol Use: Yes     Comment: daily red wine  . Drug Use: No  . Sexually Active: Not on file   Other Topics Concern  . Not on file   Social History Narrative   Lives with spouse, daughter.    Has a cat.   Always uses seat belts.   Heavy exercise.   Gets annual pap smears by gyn, has annual mammograms at [place].    Objective:  BP 140/88  Pulse 70  Temp(Src) 97.9 F (36.6 C) (Oral)  Resp 14  Wt 120 lb (54.432  kg)  BMI 20.59 kg/m2  SpO2 99%  General appearance: alert, cooperative and appears stated age Ears: normal TM's and external ear canals both ears Throat: lips, mucosa, and tongue normal; teeth and gums normal Neck: no adenopathy, no carotid bruit, supple, symmetrical, trachea midline and thyroid not enlarged, symmetric, no tenderness/mass/nodules Back: symmetric, no curvature. ROM normal. No CVA tenderness. Lungs: clear to auscultation bilaterally Heart: regular rate and rhythm, S1, S2 normal, no murmur, click, rub or gallop Abdomen: soft, non-tender; bowel sounds normal; no masses,  no organomegaly Pulses: 2+ and symmetric Skin: Skin color, texture, turgor normal. No rashes or lesions Lymph nodes: Cervical, supraclavicular, and axillary nodes normal.  Assessment and Plan:  Constipation - functional Checking electrolytes, trial  of medicaitons.  Samples of Linzess and Amitiza offered.   Abdominal pain, acute, right upper quadrant GB evaluation in the past was normal.   Abdominal bloating Persistent for several weeks despite healthy diet.  Postmenopausal.  Will check CA125 and CT of abd and pelvis  A total of 40 minutes was spent with patient more than half of which was spent in counseling, reviewing records from other prviders and coordination of care.  Updated Medication List Outpatient Encounter Prescriptions as of 05/20/2013  Medication Sig Dispense Refill  . conjugated estrogens (PREMARIN) vaginal cream Use intravaginally 3 times weekly.       . fish oil-omega-3 fatty acids 1000 MG capsule Take 1,200 g by mouth daily.      . fluticasone (FLONASE) 50 MCG/ACT nasal spray 2 sprays in each nostril daily.  16 g  6  . HYDROcodone-homatropine (HYCODAN) 5-1.5 MG/5ML syrup Take 5 mLs by mouth every 8 (eight) hours as needed for cough.  120 mL  0  . Multiple Minerals-Vitamins (CALCIUM CITRATE PLUS PO) Take by mouth.      . Multiple Vitamins-Minerals (MULTIVITAMIN PO) Take by mouth.      . Wheat Dextrin (BENEFIBER DRINK MIX PO) Take one tsp by mouth every morning.        No facility-administered encounter medications on file as of 05/20/2013.     Orders Placed This Encounter  Procedures  . EKG 12-Lead    No Follow-up on file.

## 2013-05-20 NOTE — Patient Instructions (Addendum)
We are checking your liver enzymes,  Lytes,  Etc today along with a Ca 125.  I would like to order an abdominal and pelvic CT bc of the bloating you have been having  Substitute an electrolyte replacement drink for some of the water  you are drinking daily   Try the amitiza one tablet twice daily for a few days or the Linzess one tablet daily (both are approved for constipation)

## 2013-05-21 ENCOUNTER — Encounter: Payer: Self-pay | Admitting: Internal Medicine

## 2013-05-21 DIAGNOSIS — R140 Abdominal distension (gaseous): Secondary | ICD-10-CM | POA: Insufficient documentation

## 2013-05-21 NOTE — Assessment & Plan Note (Signed)
Checking electrolytes, trial of medicaitons.  Samples of Linzess and Amitiza offered.

## 2013-05-21 NOTE — Assessment & Plan Note (Signed)
GB evaluation in the past was normal.

## 2013-05-21 NOTE — Assessment & Plan Note (Signed)
Persistent for several weeks despite healthy diet.  Postmenopausal.  Will check CA125 and CT of abd and pelvis

## 2013-05-27 ENCOUNTER — Encounter: Payer: Self-pay | Admitting: Internal Medicine

## 2013-05-28 ENCOUNTER — Telehealth: Payer: Self-pay | Admitting: Internal Medicine

## 2013-05-29 ENCOUNTER — Encounter: Payer: Self-pay | Admitting: Internal Medicine

## 2013-06-05 ENCOUNTER — Encounter: Payer: Self-pay | Admitting: Internal Medicine

## 2013-07-15 LAB — HM PAP SMEAR: HM Pap smear: NORMAL

## 2013-08-01 ENCOUNTER — Other Ambulatory Visit: Payer: Self-pay | Admitting: Internal Medicine

## 2013-08-01 MED ORDER — ESTROGENS, CONJUGATED 0.625 MG/GM VA CREA: TOPICAL_CREAM | VAGINAL | Status: DC

## 2013-08-04 ENCOUNTER — Encounter: Payer: Self-pay | Admitting: Internal Medicine

## 2013-08-04 DIAGNOSIS — Z789 Other specified health status: Secondary | ICD-10-CM | POA: Insufficient documentation

## 2013-08-25 ENCOUNTER — Encounter: Payer: Self-pay | Admitting: Internal Medicine

## 2013-08-25 ENCOUNTER — Ambulatory Visit (INDEPENDENT_AMBULATORY_CARE_PROVIDER_SITE_OTHER): Payer: PRIVATE HEALTH INSURANCE | Admitting: Internal Medicine

## 2013-08-25 VITALS — BP 134/78 | HR 74 | Temp 97.9°F | Resp 14 | Ht 62.0 in | Wt 120.8 lb

## 2013-08-25 DIAGNOSIS — Z1239 Encounter for other screening for malignant neoplasm of breast: Secondary | ICD-10-CM

## 2013-08-25 DIAGNOSIS — Z0000 Encounter for general adult medical examination without abnormal findings: Secondary | ICD-10-CM

## 2013-08-25 DIAGNOSIS — K5909 Other constipation: Secondary | ICD-10-CM

## 2013-08-25 DIAGNOSIS — K5904 Chronic idiopathic constipation: Secondary | ICD-10-CM

## 2013-08-25 DIAGNOSIS — Z1231 Encounter for screening mammogram for malignant neoplasm of breast: Secondary | ICD-10-CM

## 2013-08-25 NOTE — Progress Notes (Signed)
Patient ID: Erica Maldonado, female   DOB: 1951-10-28, 62 y.o.   MRN: [id number]   Subjective:    Erica Maldonado is a 62 y.o. female who presents for an annual exam. The patient has no complaints today. The patient is sexually active. GYN screening history: last pap: was normal. The patient wears seatbelts: yes. The patient participates in regular exercise: yes. Has the patient ever been transfused or tattooed?: yes. The patient reports that there is not domestic violence in her life.   Menstrual History: OB History   Grav Para Term Preterm Abortions TAB SAB Ect Mult Living                  Menarche age: 2  No LMP recorded. Patient is postmenopausal.    The following portions of the patient's history were reviewed and updated as appropriate: allergies, current medications, past family history, past medical history, past social history, past surgical history and problem list.  Review of Systems A comprehensive review of systems was negative.    Objective:   BP 134/78  Pulse 74  Temp(Src) 97.9 F (36.6 C) (Oral)  Resp 14  Ht 5\' 2"  (1.575 m)  Wt 120 lb 12 oz (54.772 kg)  BMI 22.08 kg/m2  SpO2 99%  BP 134/78  Pulse 74  Temp(Src) 97.9 F (36.6 C) (Oral)  Resp 14  Ht 5\' 2"  (1.575 m)  Wt 120 lb 12 oz (54.772 kg)  BMI 22.08 kg/m2  SpO2 99%  General Appearance:    Alert, cooperative, no distress, appears stated age  Head:    Normocephalic, without obvious abnormality, atraumatic  Eyes:    PERRL, conjunctiva/corneas clear, EOM's intact, fundi    benign, both eyes  Ears:    Normal TM's and external ear canals, both ears  Nose:   Nares normal, septum midline, mucosa normal, no drainage    or sinus tenderness  Throat:   Lips, mucosa, and tongue normal; teeth and gums normal  Neck:   Supple, symmetrical, trachea midline, no adenopathy;    thyroid:  no enlargement/tenderness/nodules; no carotid   bruit or JVD  Back:     Symmetric, no curvature, ROM normal, no CVA tenderness   Lungs:     Clear to auscultation bilaterally, respirations unlabored  Chest Wall:    No tenderness or deformity   Heart:    Regular rate and rhythm, S1 and S2 normal, no murmur, rub   or gallop  Breast Exam:    No tenderness, masses, or nipple abnormality.  Breasts are nodular,  Biopsy scar on right noted.   Abdomen:     Soft, non-tender, bowel sounds active all four quadrants,    no masses, no organomegaly        Extremities:   Extremities normal, atraumatic, no cyanosis or edema  Pulses:   2+ and symmetric all extremities  Skin:   Skin color, texture, turgor normal, no rashes or lesions  Lymph nodes:   Cervical, supraclavicular, and axillary nodes normal  Neurologic:   CNII-XII intact, normal strength, sensation and reflexes    throughout   Assessment and Plan:  Constipation - functional Improved with trial of low dose amitiaz an dlinzess. Abdominal bloating was noted with both,  Linzess was more successful   Encounter for preventive health examination Annual comprehensive exam was done including breast, excluding pelvic exam. (done by Dr Luella Cook). All screenings have been addressed .    Updated Medication List Outpatient Encounter Prescriptions as of 08/25/2013  Medication Sig  Dispense Refill  . conjugated estrogens (PREMARIN) vaginal cream Use intravaginally 3 times weekly.  42.5 g  6  . fish oil-omega-3 fatty acids 1000 MG capsule Take 1,200 g by mouth daily.      . fluticasone (FLONASE) 50 MCG/ACT nasal spray 2 sprays in each nostril daily.  16 g  6  . Multiple Minerals-Vitamins (CALCIUM CITRATE PLUS PO) Take by mouth.      . Multiple Vitamins-Minerals (MULTIVITAMIN PO) Take by mouth.      . [DISCONTINUED] HYDROcodone-homatropine (HYCODAN) 5-1.5 MG/5ML syrup Take 5 mLs by mouth every 8 (eight) hours as needed for cough.  120 mL  0  . [DISCONTINUED] Wheat Dextrin (BENEFIBER DRINK MIX PO) Take one tsp by mouth every morning.        No facility-administered encounter  medications on file as of 08/25/2013.

## 2013-08-25 NOTE — Assessment & Plan Note (Signed)
Improved with trial of low dose amitiaz an dlinzess. Abdominal bloating was noted with both,  Linzess was more successful

## 2013-08-26 DIAGNOSIS — Z1239 Encounter for other screening for malignant neoplasm of breast: Secondary | ICD-10-CM | POA: Insufficient documentation

## 2013-08-26 NOTE — Assessment & Plan Note (Signed)
Annual comprehensive exam was done including breast, excluding pelvic exam. (done by Dr Luella Cook). All screenings have been addressed .

## 2013-09-02 ENCOUNTER — Encounter: Payer: Self-pay | Admitting: Internal Medicine

## 2013-09-05 ENCOUNTER — Telehealth: Payer: Self-pay | Admitting: Internal Medicine

## 2013-09-08 NOTE — Telephone Encounter (Signed)
Mailed unread message to pt  

## 2013-09-16 ENCOUNTER — Ambulatory Visit: Payer: Self-pay | Admitting: Internal Medicine

## 2013-09-16 IMAGING — MG MM DIGITAL SCREENING BILAT W/ CAD
1 series · 4 of 4 positions shown · non-contrast
Comparison: Previous exam(s).

CLINICAL DATA: Screening. History of prior right excisional
biopsies, benign.

EXAM:
DIGITAL SCREENING BILATERAL MAMMOGRAM WITH CAD

[R CC · right · 4 of 4 slices shown]
[im 1/4]
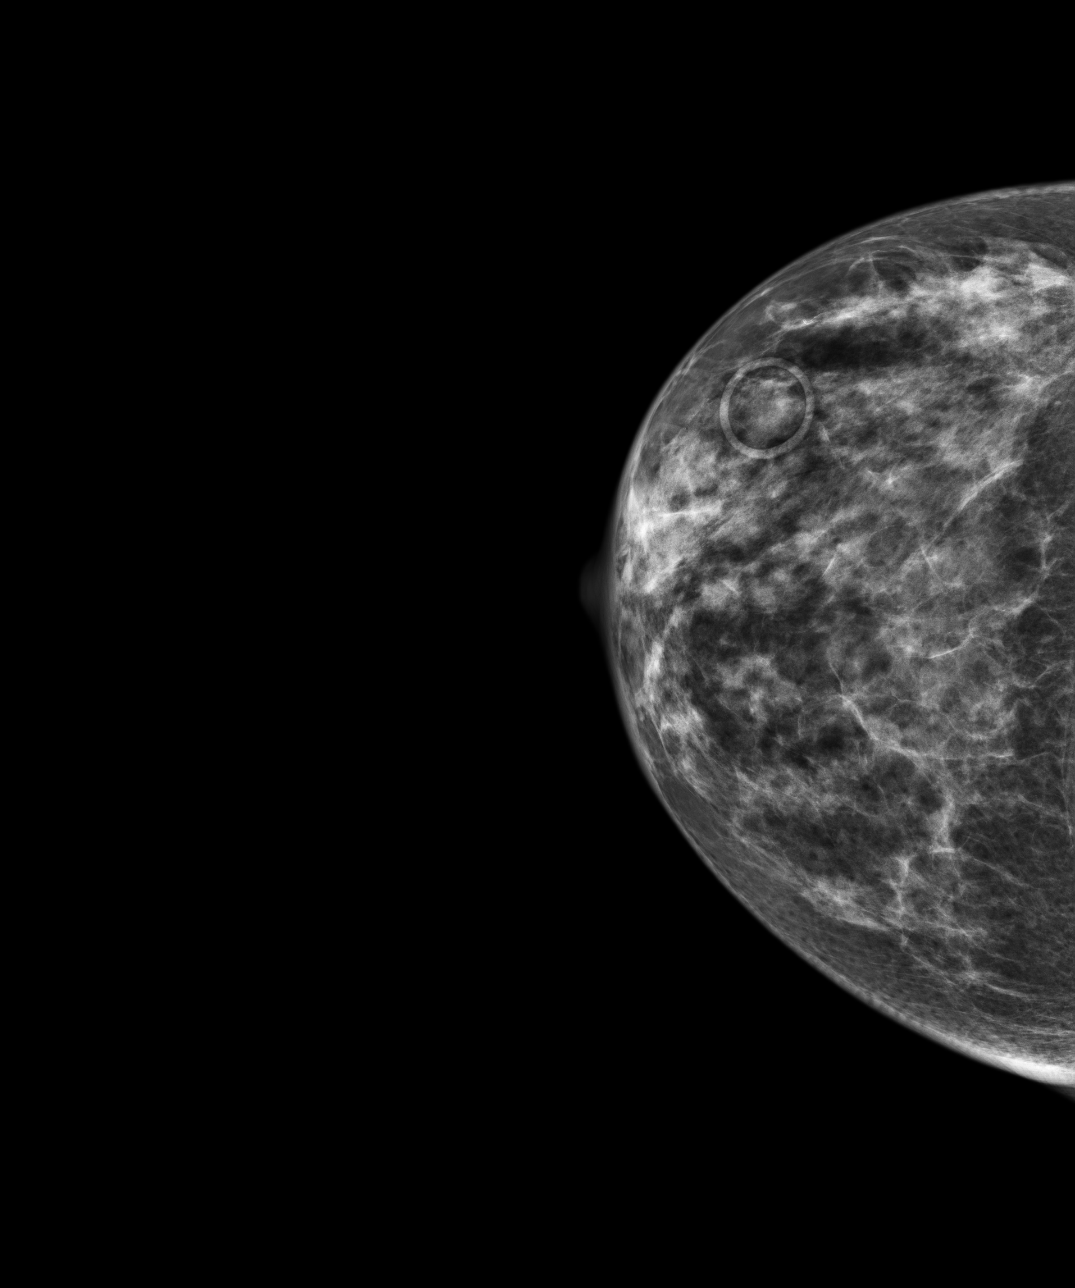
[im 2/4]
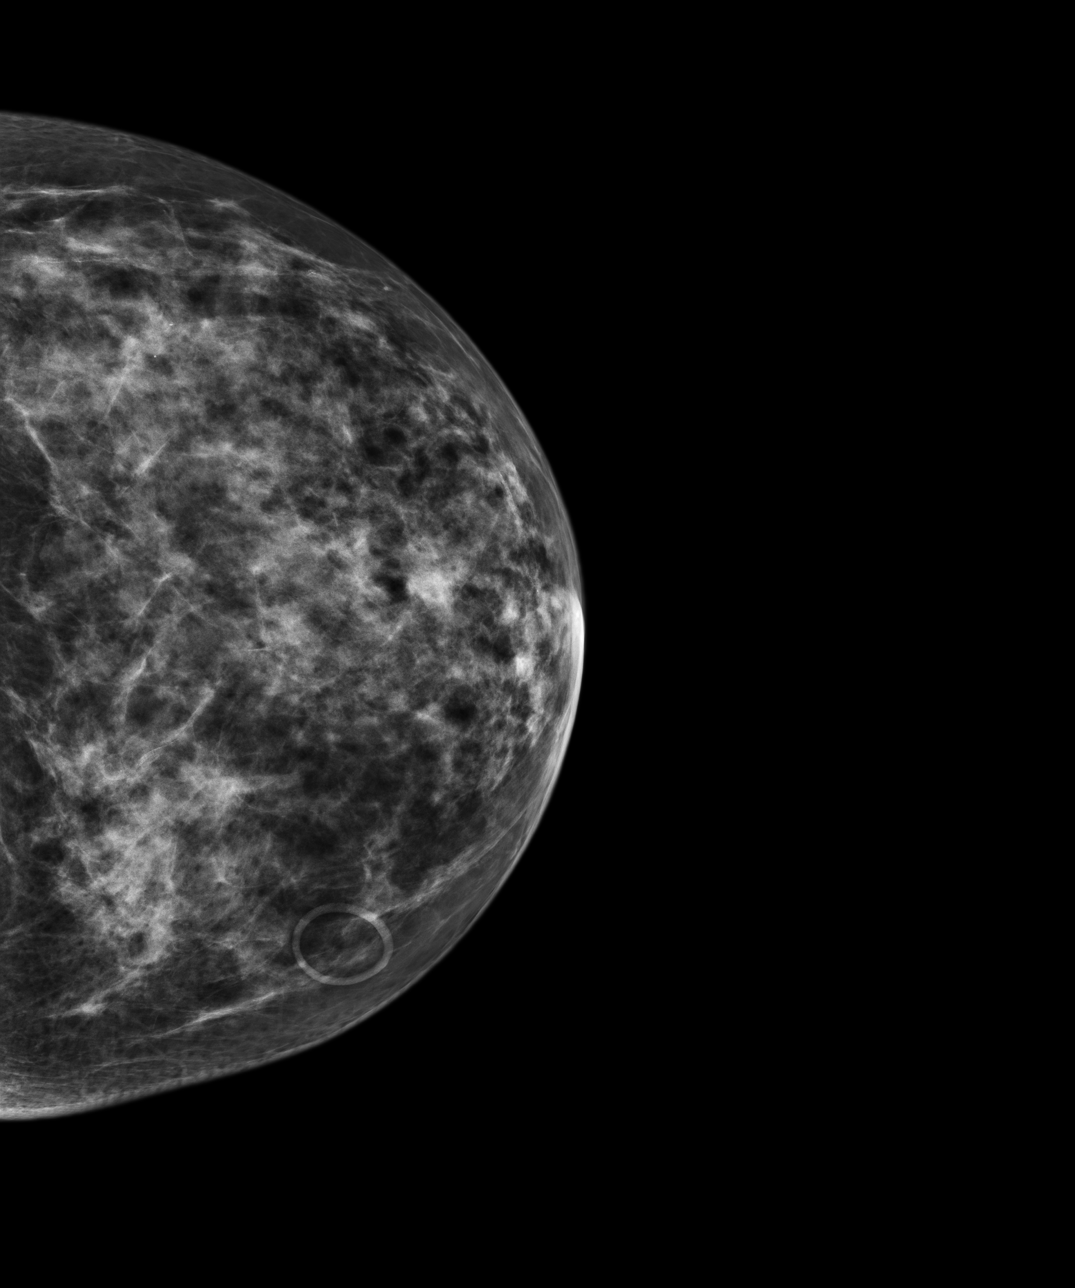
[im 3/4]
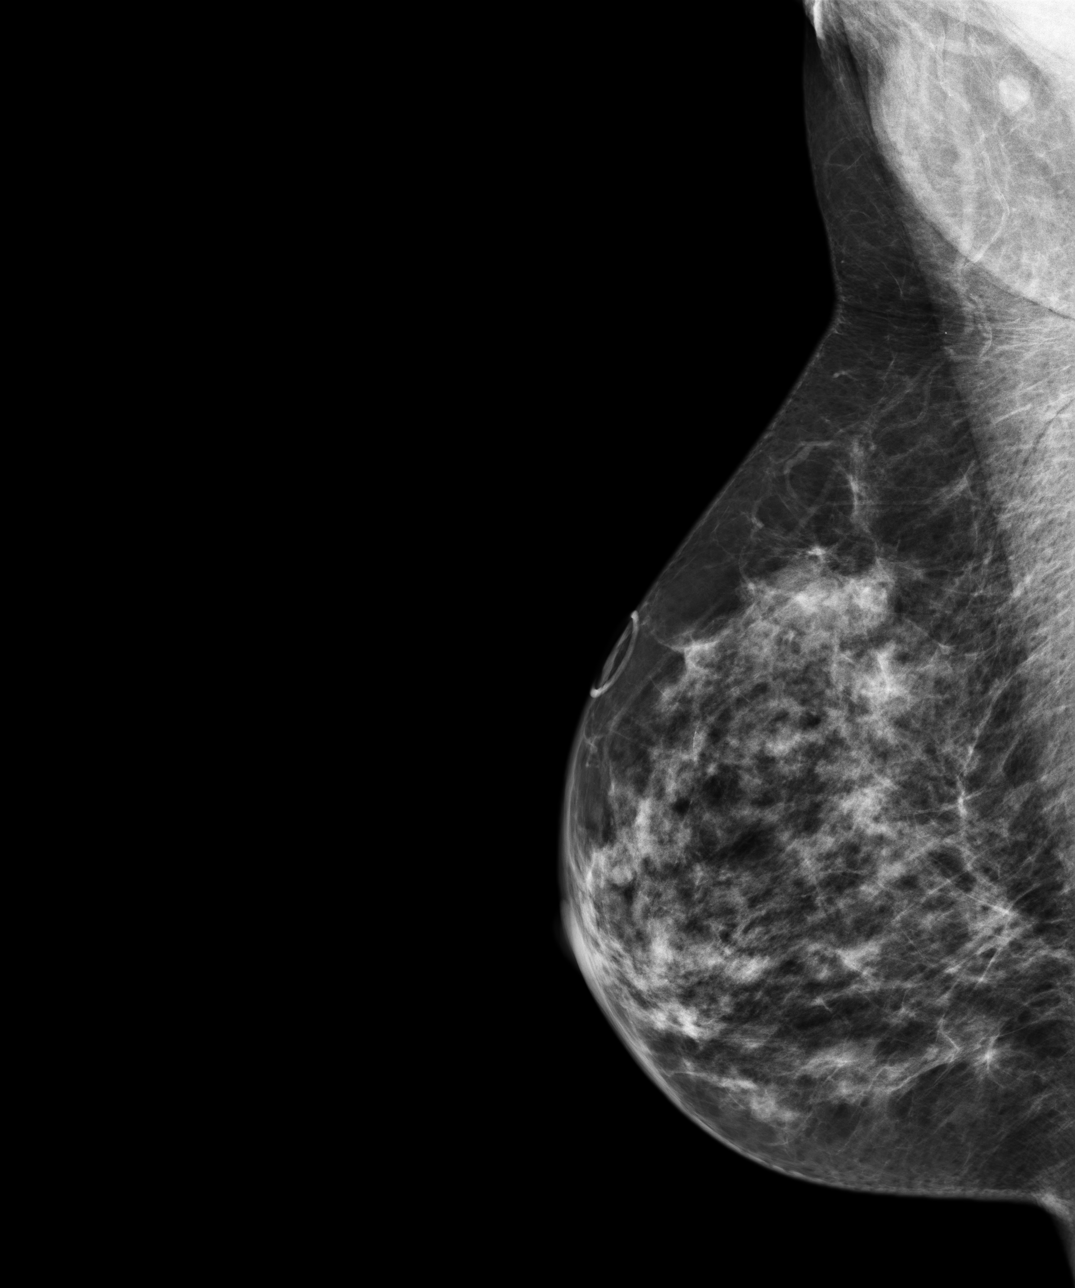
[im 4/4]
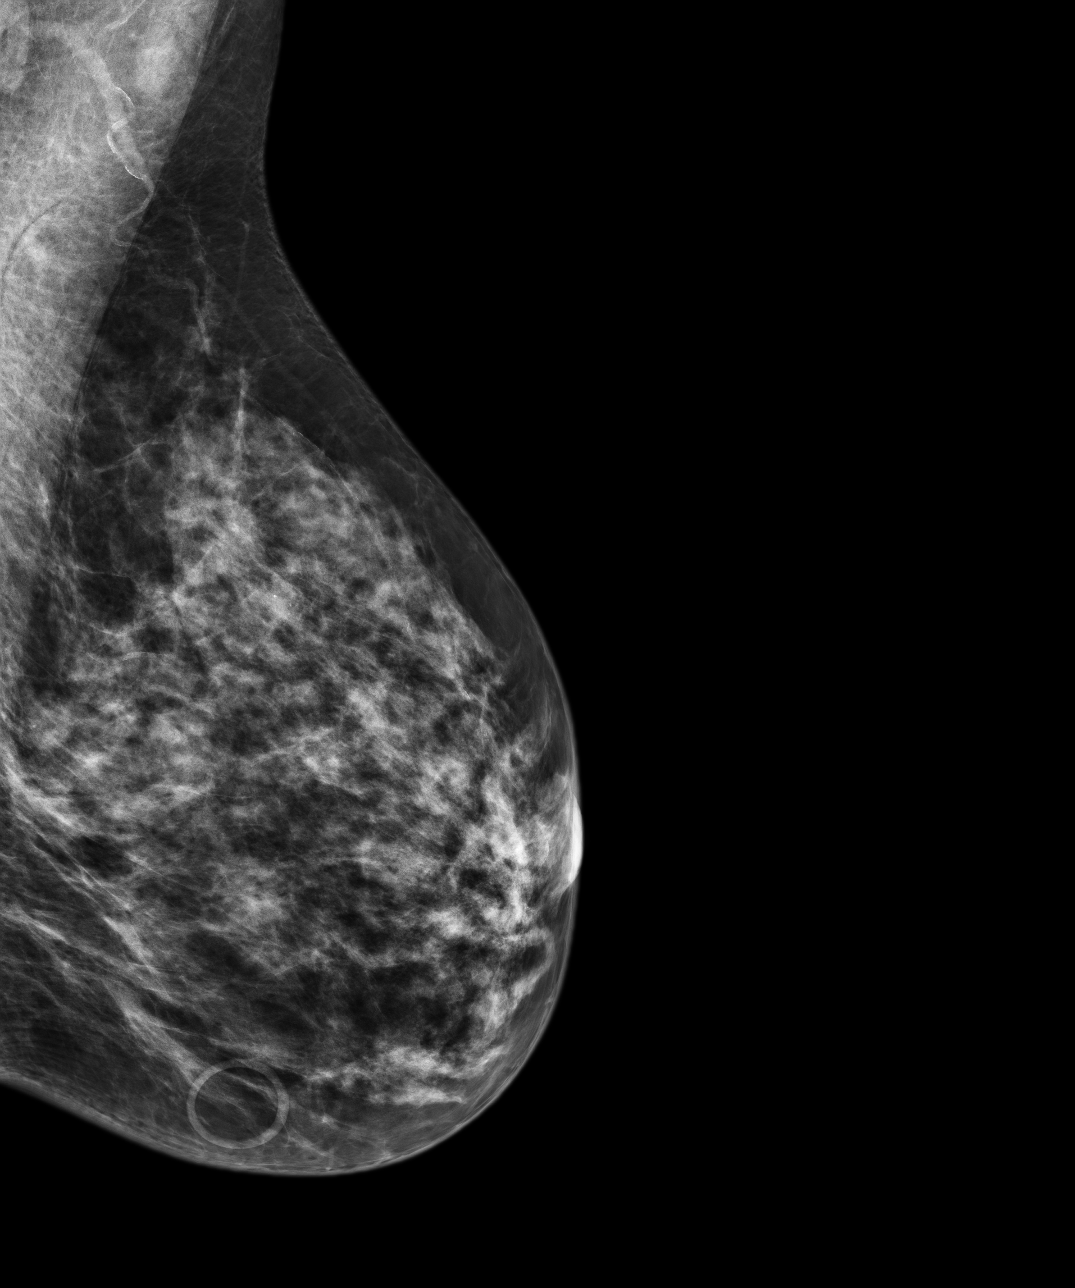

[4 of 4 positions shown; findings below may reference images not displayed]

ACR Breast Density Category c: The breasts are heterogeneously
dense, which may obscure small masses.
FINDINGS: There are no findings suspicious for malignancy. Images were
processed with CAD. Evidence of right benign excisional biopsy
reidentified.
IMPRESSION: No mammographic evidence of malignancy. A result letter of this
screening mammogram will be mailed directly to the patient.

RECOMMENDATION:
Screening mammogram in one year. (Code:[8W])

BI-RADS CATEGORY  1: Negative

## 2013-09-23 ENCOUNTER — Encounter: Payer: Self-pay | Admitting: Internal Medicine

## 2013-09-23 ENCOUNTER — Encounter: Payer: Self-pay | Admitting: *Deleted

## 2013-10-06 ENCOUNTER — Encounter: Payer: Self-pay | Admitting: Internal Medicine

## 2013-10-07 NOTE — Telephone Encounter (Signed)
Mailed unread message to pt  

## 2014-04-21 ENCOUNTER — Other Ambulatory Visit: Payer: Self-pay | Admitting: Internal Medicine

## 2014-04-21 ENCOUNTER — Telehealth: Payer: Self-pay | Admitting: Internal Medicine

## 2014-04-21 DIAGNOSIS — R202 Paresthesia of skin: Principal | ICD-10-CM

## 2014-04-21 DIAGNOSIS — R200 Anesthesia of skin: Secondary | ICD-10-CM | POA: Insufficient documentation

## 2014-04-21 DIAGNOSIS — M7660 Achilles tendinitis, unspecified leg: Secondary | ICD-10-CM

## 2014-04-21 NOTE — Telephone Encounter (Signed)
Patient needs to have labs drawn at the [facility] lab in [facility].  I have already spoken to her . We just need to fax them the singed letter I have printed out tomorrow.

## 2014-04-22 ENCOUNTER — Telehealth: Payer: Self-pay | Admitting: Internal Medicine

## 2014-04-22 NOTE — Telephone Encounter (Signed)
Lab request has been faxed and patient notified patient also to pick up lab letter. Placed up front.

## 2014-04-22 NOTE — Telephone Encounter (Signed)
Sonia Baller from [facility] called fax number is [phone]

## 2014-04-22 NOTE — Telephone Encounter (Signed)
Need to know address of lab

## 2014-04-22 NOTE — Telephone Encounter (Signed)
Called pt to find out what [facility] lab she wanted :   [facility], [place], [place] [phone]  Called [facility], left a vmail to speak to either Kyle Stansell or Juliann Pulse to get there fax number, to fax over an order form

## 2014-04-22 NOTE — Telephone Encounter (Signed)
Patient is going to have lab work done tomorrow and requested the lab request numbers. She will be going to [facility] in [place] and was not such if she needed to take the lab request order with her or not. Please call to advise of what she will needed/msn

## 2014-04-22 NOTE — Telephone Encounter (Signed)
Faxed lab request as requested

## 2014-04-29 ENCOUNTER — Other Ambulatory Visit: Payer: Self-pay | Admitting: Internal Medicine

## 2014-04-29 LAB — CBC WITH DIFFERENTIAL/PLATELET
BASOS ABS: 0 10*3/uL (ref 0.0–0.1)
BASOS PCT: 0 % (ref 0–1)
EOS ABS: 0.1 10*3/uL (ref 0.0–0.7)
Eosinophils Relative: 1 % (ref 0–5)
HCT: 41.7 % (ref 36.0–46.0)
HEMOGLOBIN: 14.2 g/dL (ref 12.0–15.0)
Lymphocytes Relative: 19 % (ref 12–46)
Lymphs Abs: 1 10*3/uL (ref 0.7–4.0)
MCH: 30 pg (ref 26.0–34.0)
MCHC: 34.1 g/dL (ref 30.0–36.0)
MCV: 88.2 fL (ref 78.0–100.0)
MONOS PCT: 8 % (ref 3–12)
Monocytes Absolute: 0.4 10*3/uL (ref 0.1–1.0)
NEUTROS PCT: 72 % (ref 43–77)
Neutro Abs: 3.6 10*3/uL (ref 1.7–7.7)
PLATELETS: 215 10*3/uL (ref 150–400)
RBC: 4.73 MIL/uL (ref 3.87–5.11)
RDW: 14.5 % (ref 11.5–15.5)
WBC: 5 10*3/uL (ref 4.0–10.5)

## 2014-04-30 ENCOUNTER — Encounter: Payer: Self-pay | Admitting: Internal Medicine

## 2014-04-30 LAB — TSH: TSH: 2.076 u[IU]/mL (ref 0.350–4.500)

## 2014-04-30 LAB — VITAMIN D 25 HYDROXY (VIT D DEFICIENCY, FRACTURES): Vit D, 25-Hydroxy: 28 ng/mL — ABNORMAL LOW (ref 30–89)

## 2014-04-30 LAB — COMPREHENSIVE METABOLIC PANEL
ALK PHOS: 76 U/L (ref 39–117)
ALT: 27 U/L (ref 0–35)
AST: 30 U/L (ref 0–37)
Albumin: 4.4 g/dL (ref 3.5–5.2)
BILIRUBIN TOTAL: 0.7 mg/dL (ref 0.2–1.2)
BUN: 15 mg/dL (ref 6–23)
CO2: 29 mEq/L (ref 19–32)
Calcium: 9.1 mg/dL (ref 8.4–10.5)
Chloride: 104 mEq/L (ref 96–112)
Creat: 0.69 mg/dL (ref 0.50–1.10)
GLUCOSE: 77 mg/dL (ref 70–99)
Potassium: 4.4 mEq/L (ref 3.5–5.3)
SODIUM: 142 meq/L (ref 135–145)
TOTAL PROTEIN: 6.6 g/dL (ref 6.0–8.3)

## 2014-04-30 LAB — LIPID PANEL
CHOL/HDL RATIO: 2.6 ratio
Cholesterol: 198 mg/dL (ref 0–200)
HDL: 77 mg/dL (ref >39)
LDL CALC: 107 mg/dL — AB (ref 0–99)
Triglycerides: 72 mg/dL (ref <150)
VLDL: 14 mg/dL (ref 0–40)

## 2014-04-30 LAB — VITAMIN B12: VITAMIN B 12: 295 pg/mL (ref 211–911)

## 2014-04-30 LAB — FOLATE RBC: RBC Folate: 252 ng/mL — ABNORMAL LOW (ref >280)

## 2014-05-02 ENCOUNTER — Telehealth: Payer: Self-pay | Admitting: Internal Medicine

## 2014-05-02 DIAGNOSIS — E538 Deficiency of other specified B group vitamins: Secondary | ICD-10-CM | POA: Insufficient documentation

## 2014-05-02 MED ORDER — FOLIC ACID 1 MG PO TABS: 1.0000 mg | ORAL_TABLET | Freq: Every day | ORAL | Status: DC

## 2014-05-02 MED ORDER — CYANOCOBALAMIN 1000 MCG SL SUBL: 1.0000 | SUBLINGUAL_TABLET | Freq: Every day | SUBLINGUAL | Status: DC

## 2014-05-05 NOTE — Telephone Encounter (Signed)
Patient would like to try the oral B 12 and folic acid. Patient came into office for lab results read patient My Chart message.

## 2014-05-12 ENCOUNTER — Ambulatory Visit: Payer: PRIVATE HEALTH INSURANCE | Admitting: Internal Medicine

## 2014-09-09 ENCOUNTER — Telehealth: Payer: Self-pay | Admitting: Internal Medicine

## 2014-09-14 ENCOUNTER — Ambulatory Visit (INDEPENDENT_AMBULATORY_CARE_PROVIDER_SITE_OTHER): Payer: PRIVATE HEALTH INSURANCE | Admitting: Internal Medicine

## 2014-09-14 ENCOUNTER — Encounter: Payer: Self-pay | Admitting: Internal Medicine

## 2014-09-14 VITALS — BP 132/78 | HR 71 | Temp 98.1°F | Resp 14 | Ht 62.0 in | Wt 121.5 lb

## 2014-09-14 DIAGNOSIS — K5909 Other constipation: Secondary | ICD-10-CM

## 2014-09-14 DIAGNOSIS — K5904 Chronic idiopathic constipation: Secondary | ICD-10-CM

## 2014-09-14 DIAGNOSIS — Z0000 Encounter for general adult medical examination without abnormal findings: Secondary | ICD-10-CM

## 2014-09-14 NOTE — Progress Notes (Signed)
Patient ID: Erica Maldonado, female   DOB: 11/27/1950, 63 y.o.   MRN: [id number]    Subjective:     Erica Maldonado is a 63 y.o. female and is here for a comprehensive physical exam. The patient reports no problems.  History   Social History  . Marital Status: Married    Spouse Name: N/A    Number of Children: N/A  . Years of Education: N/A   Occupational History  . nurse- full time    Social History Main Topics  . Smoking status: Never Smoker   . Smokeless tobacco: Never Used     Comment: Remote history  . Alcohol Use: Yes     Comment: daily red wine  . Drug Use: No  . Sexual Activity: Not on file   Other Topics Concern  . Not on file   Social History Narrative   Lives with spouse, daughter.    Has a cat.   Always uses seat belts.   Heavy exercise.   Gets annual pap smears by gyn, has annual mammograms at [place].   Health Maintenance  Topic Date Due  . Zostavax  11/10/2010  . Influenza Vaccine  06/20/2014  . Mammogram  09/17/2015  . Pap Smear  07/15/2016  . Tetanus/tdap  08/25/2017  . Colonoscopy  12/04/2021    The following portions of the patient's history were reviewed and updated as appropriate: allergies, current medications, past family history, past medical history, past social history, past surgical history and problem list.  Review of Systems A comprehensive review of systems was negative.   Objective:   BP 132/78  Pulse 71  Temp(Src) 98.1 F (36.7 C) (Oral)  Resp 14  Ht 5\' 2"  (1.575 m)  Wt 121 lb 8 oz (55.112 kg)  BMI 22.22 kg/m2  SpO2 97%  General appearance: alert, cooperative and appears stated age Head: Normocephalic, without obvious abnormality, atraumatic Eyes: conjunctivae/corneas clear. PERRL, EOM's intact. Fundi benign. Ears: normal TM's and external ear canals both ears Nose: Nares normal. Septum midline. Mucosa normal. No drainage or sinus tenderness. Throat: lips, mucosa, and tongue normal; teeth and gums normal Neck: no  adenopathy, no carotid bruit, no JVD, supple, symmetrical, trachea midline and thyroid not enlarged, symmetric, no tenderness/mass/nodules Lungs: clear to auscultation bilaterally Breasts: normal appearance, no masses or tenderness Heart: regular rate and rhythm, S1, S2 normal, no murmur, click, rub or gallop Abdomen: soft, non-tender; bowel sounds normal; no masses,  no organomegaly Extremities: extremities normal, atraumatic, no cyanosis or edema Pulses: 2+ and symmetric Skin: Skin color, texture, turgor normal. No rashes or lesions Neurologic: Alert and oriented X 3, normal strength and tone. Normal symmetric reflexes. Normal coordination and gait.  Assessment and Plan:    Encounter for preventive health examination Annual wellness  exam was done as well as a comprehensive physical exam and management of acute and chronic conditions .  During the course of the visit the patient was educated and counseled about appropriate screening and preventive services including : fall prevention , diabetes screening, nutrition counseling, colorectal cancer screening, and recommended immunizations.  Printed recommendations for health maintenance screenings was given.   Constipation - functional Managed with olive oil bid and prunes,  Prior medication trials were not tolerated.    Updated Medication List Outpatient Encounter Prescriptions as of 09/14/2014  Medication Sig  . conjugated estrogens (PREMARIN) vaginal cream Use intravaginally 3 times weekly.  . Cyanocobalamin 1000 MCG SUBL Place 1 tablet (1,000 mcg total) under the tongue daily.  Marland Kitchen  fish oil-omega-3 fatty acids 1000 MG capsule Take 1,200 g by mouth daily.  . Multiple Minerals-Vitamins (CALCIUM CITRATE PLUS PO) Take by mouth.  . Multiple Vitamins-Minerals (MULTIVITAMIN PO) Take by mouth.  . [DISCONTINUED] fluticasone (FLONASE) 50 MCG/ACT nasal spray 2 sprays in each nostril daily.  . [DISCONTINUED] folic acid (FOLVITE) 1 MG tablet Take 1  tablet (1 mg total) by mouth daily.

## 2014-09-14 NOTE — Progress Notes (Signed)
Pre visit review using our clinic review tool, if applicable. No additional management support is needed unless otherwise documented below in the visit note. 

## 2014-09-14 NOTE — Patient Instructions (Signed)
Try adding colace 100 mg once or twice daily for your hard stools  Health Maintenance Adopting a healthy lifestyle and getting preventive care can go a long way to promote health and wellness. Talk with your health care provider about what schedule of regular examinations is right for you. This is a good chance for you to check in with your provider about disease prevention and staying healthy. In between checkups, there are plenty of things you can do on your own. Experts have done a lot of research about which lifestyle changes and preventive measures are most likely to keep you healthy. Ask your health care provider for more information. WEIGHT AND DIET  Eat a healthy diet  Be sure to include plenty of vegetables, fruits, low-fat dairy products, and lean protein.  Do not eat a lot of foods high in solid fats, added sugars, or salt.  Get regular exercise. This is one of the most important things you can do for your health.  Most adults should exercise for at least 150 minutes each week. The exercise should increase your heart rate and make you sweat (moderate-intensity exercise).  Most adults should also do strengthening exercises at least twice a week. This is in addition to the moderate-intensity exercise.  Maintain a healthy weight  Body mass index (BMI) is a measurement that can be used to identify possible weight problems. It estimates body fat based on height and weight. Your health care provider can help determine your BMI and help you achieve or maintain a healthy weight.  For females 33 years of age and older:   A BMI below 18.5 is considered underweight.  A BMI of 18.5 to 24.9 is normal.  A BMI of 25 to 29.9 is considered overweight.  A BMI of 30 and above is considered obese.  Watch levels of cholesterol and blood lipids  You should start having your blood tested for lipids and cholesterol at 64 years of age, then have this test every 5 years.  You may need to have  your cholesterol levels checked more often if:  Your lipid or cholesterol levels are high.  You are older than 64 years of age.  You are at high risk for heart disease.  CANCER SCREENING   Lung Cancer  Lung cancer screening is recommended for adults 61-79 years old who are at high risk for lung cancer because of a history of smoking.  A yearly low-dose CT scan of the lungs is recommended for people who:  Currently smoke.  Have quit within the past 15 years.  Have at least a 30-pack-year history of smoking. A pack year is smoking an average of one pack of cigarettes a day for 1 year.  Yearly screening should continue until it has been 15 years since you quit.  Yearly screening should stop if you develop a health problem that would prevent you from having lung cancer treatment.  Breast Cancer  Practice breast self-awareness. This means understanding how your breasts normally appear and feel.  It also means doing regular breast self-exams. Let your health care provider know about any changes, no matter how small.  If you are in your 20s or 30s, you should have a clinical breast exam (CBE) by a health care provider every 1-3 years as part of a regular health exam.  If you are 57 or older, have a CBE every year. Also consider having a breast X-ray (mammogram) every year.  If you have a family history of breast  cancer, talk to your health care provider about genetic screening.  If you are at high risk for breast cancer, talk to your health care provider about having an MRI and a mammogram every year.  Breast cancer gene (BRCA) assessment is recommended for women who have family members with BRCA-related cancers. BRCA-related cancers include:  Breast.  Ovarian.  Tubal.  Peritoneal cancers.  Results of the assessment will determine the need for genetic counseling and BRCA1 and BRCA2 testing. Cervical Cancer Routine pelvic examinations to screen for cervical cancer are no  longer recommended for nonpregnant women who are considered low risk for cancer of the pelvic organs (ovaries, uterus, and vagina) and who do not have symptoms. A pelvic examination may be necessary if you have symptoms including those associated with pelvic infections. Ask your health care provider if a screening pelvic exam is right for you.   The Pap test is the screening test for cervical cancer for women who are considered at risk.  If you had a hysterectomy for a problem that was not cancer or a condition that could lead to cancer, then you no longer need Pap tests.  If you are older than 65 years, and you have had normal Pap tests for the past 10 years, you no longer need to have Pap tests.  If you have had past treatment for cervical cancer or a condition that could lead to cancer, you need Pap tests and screening for cancer for at least 20 years after your treatment.  If you no longer get a Pap test, assess your risk factors if they change (such as having a new sexual partner). This can affect whether you should start being screened again.  Some women have medical problems that increase their chance of getting cervical cancer. If this is the case for you, your health care provider may recommend more frequent screening and Pap tests.  The human papillomavirus (HPV) test is another test that may be used for cervical cancer screening. The HPV test looks for the virus that can cause cell changes in the cervix. The cells collected during the Pap test can be tested for HPV.  The HPV test can be used to screen women 8 years of age and older. Getting tested for HPV can extend the interval between normal Pap tests from three to five years.  An HPV test also should be used to screen women of any age who have unclear Pap test results.  After 64 years of age, women should have HPV testing as often as Pap tests.  Colorectal Cancer  This type of cancer can be detected and often  prevented.  Routine colorectal cancer screening usually begins at 64 years of age and continues through 64 years of age.  Your health care provider may recommend screening at an earlier age if you have risk factors for colon cancer.  Your health care provider may also recommend using home test kits to check for hidden blood in the stool.  A small camera at the end of a tube can be used to examine your colon directly (sigmoidoscopy or colonoscopy). This is done to check for the earliest forms of colorectal cancer.  Routine screening usually begins at age 32.  Direct examination of the colon should be repeated every 5-10 years through 64 years of age. However, you may need to be screened more often if early forms of precancerous polyps or small growths are found. Skin Cancer  Check your skin from head to toe  regularly.  Tell your health care provider about any new moles or changes in moles, especially if there is a change in a mole's shape or color.  Also tell your health care provider if you have a mole that is larger than the size of a pencil eraser.  Always use sunscreen. Apply sunscreen liberally and repeatedly throughout the day.  Protect yourself by wearing long sleeves, pants, a wide-brimmed hat, and sunglasses whenever you are outside. HEART DISEASE, DIABETES, AND HIGH BLOOD PRESSURE   Have your blood pressure checked at least every 1-2 years. High blood pressure causes heart disease and increases the risk of stroke.  If you are between 57 years and 25 years old, ask your health care provider if you should take aspirin to prevent strokes.  Have regular diabetes screenings. This involves taking a blood sample to check your fasting blood sugar level.  If you are at a normal weight and have a low risk for diabetes, have this test once every three years after 64 years of age.  If you are overweight and have a high risk for diabetes, consider being tested at a younger age or more  often. PREVENTING INFECTION  Hepatitis B  If you have a higher risk for hepatitis B, you should be screened for this virus. You are considered at high risk for hepatitis B if:  You were born in a country where hepatitis B is common. Ask your health care provider which countries are considered high risk.  Your parents were born in a high-risk country, and you have not been immunized against hepatitis B (hepatitis B vaccine).  You have HIV or AIDS.  You use needles to inject street drugs.  You live with someone who has hepatitis B.  You have had sex with someone who has hepatitis B.  You get hemodialysis treatment.  You take certain medicines for conditions, including cancer, organ transplantation, and autoimmune conditions. Hepatitis C  Blood testing is recommended for:  Everyone born from [birth year] through 1966.  Anyone with known risk factors for hepatitis C. Sexually transmitted infections (STIs)  You should be screened for sexually transmitted infections (STIs) including gonorrhea and chlamydia if:  You are sexually active and are younger than 64 years of age.  You are older than 64 years of age and your health care provider tells you that you are at risk for this type of infection.  Your sexual activity has changed since you were last screened and you are at an increased risk for chlamydia or gonorrhea. Ask your health care provider if you are at risk.  If you do not have HIV, but are at risk, it may be recommended that you take a prescription medicine daily to prevent HIV infection. This is called pre-exposure prophylaxis (PrEP). You are considered at risk if:  You are sexually active and do not regularly use condoms or know the HIV status of your partner(s).  You take drugs by injection.  You are sexually active with a partner who has HIV. Talk with your health care provider about whether you are at high risk of being infected with HIV. If you choose to begin PrEP, you  should first be tested for HIV. You should then be tested every 3 months for as long as you are taking PrEP.  PREGNANCY   If you are premenopausal and you may become pregnant, ask your health care provider about preconception counseling.  If you may become pregnant, take 400 to 800 micrograms (mcg) of  folic acid every day.  If you want to prevent pregnancy, talk to your health care provider about birth control (contraception). OSTEOPOROSIS AND MENOPAUSE   Osteoporosis is a disease in which the bones lose minerals and strength with aging. This can result in serious bone fractures. Your risk for osteoporosis can be identified using a bone density scan.  If you are 18 years of age or older, or if you are at risk for osteoporosis and fractures, ask your health care provider if you should be screened.  Ask your health care provider whether you should take a calcium or vitamin D supplement to lower your risk for osteoporosis.  Menopause may have certain physical symptoms and risks.  Hormone replacement therapy may reduce some of these symptoms and risks. Talk to your health care provider about whether hormone replacement therapy is right for you.  HOME CARE INSTRUCTIONS   Schedule regular health, dental, and eye exams.  Stay current with your immunizations.   Do not use any tobacco products including cigarettes, chewing tobacco, or electronic cigarettes.  If you are pregnant, do not drink alcohol.  If you are breastfeeding, limit how much and how often you drink alcohol.  Limit alcohol intake to no more than 1 drink per day for nonpregnant women. One drink equals 12 ounces of beer, 5 ounces of wine, or 1 ounces of hard liquor.  Do not use street drugs.  Do not share needles.  Ask your health care provider for help if you need support or information about quitting drugs.  Tell your health care provider if you often feel depressed.  Tell your health care provider if you have ever  been abused or do not feel safe at home. Document Released: 05/22/2011 Document Revised: 03/23/2014 Document Reviewed: 10/08/2013 [facility] Patient Information 2015 [place], [place]. This information is not intended to replace advice given to you by your health care provider. Make sure you discuss any questions you have with your health care provider.

## 2014-09-16 ENCOUNTER — Encounter: Payer: Self-pay | Admitting: Internal Medicine

## 2014-09-16 NOTE — Assessment & Plan Note (Addendum)
Managed with olive oil bid and prunes,  Prior medication trials were not tolerated.

## 2014-09-16 NOTE — Assessment & Plan Note (Signed)
Annual  wellness  exam was done as well as a comprehensive physical exam and management of acute and chronic conditions .  During the course of the visit the patient was educated and counseled about appropriate screening and preventive services including : fall prevention , diabetes screening, nutrition counseling, colorectal cancer screening, and recommended immunizations.  Printed recommendations for health maintenance screenings was given.  

## 2014-09-17 ENCOUNTER — Ambulatory Visit: Payer: Self-pay | Admitting: Internal Medicine

## 2014-09-17 LAB — HM MAMMOGRAPHY: HM Mammogram: NEGATIVE

## 2014-09-17 IMAGING — MG MM DIGITAL SCREENING BILAT W/ CAD
4 series · 4 of 4 positions shown · non-contrast
Comparison: Previous exam(s).

CLINICAL DATA: Screening. Benign right excisional biopsy.

EXAM:
DIGITAL SCREENING BILATERAL MAMMOGRAM WITH CAD

[R MLO]
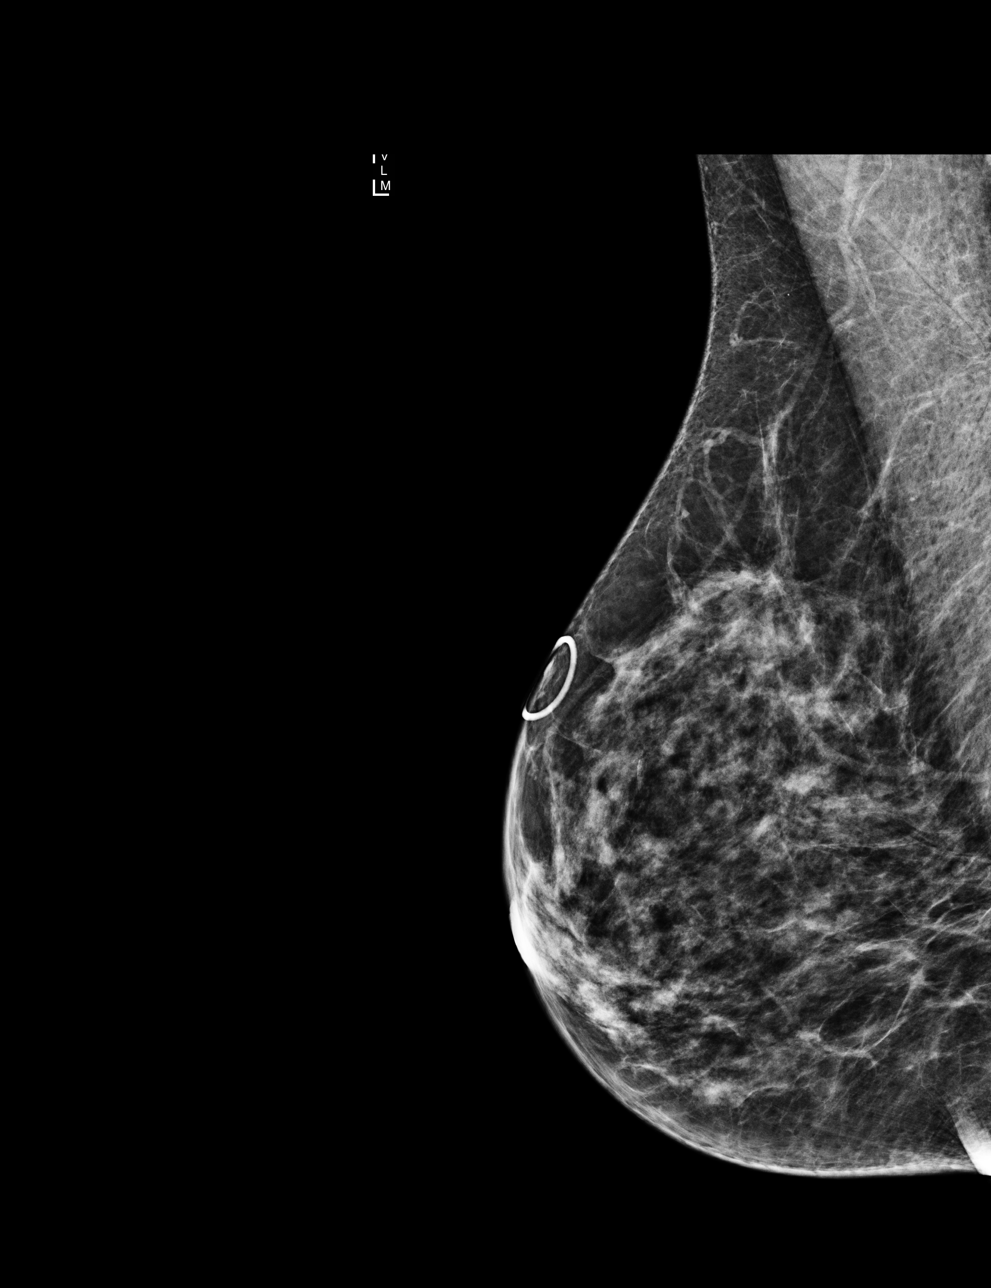

[L MLO]
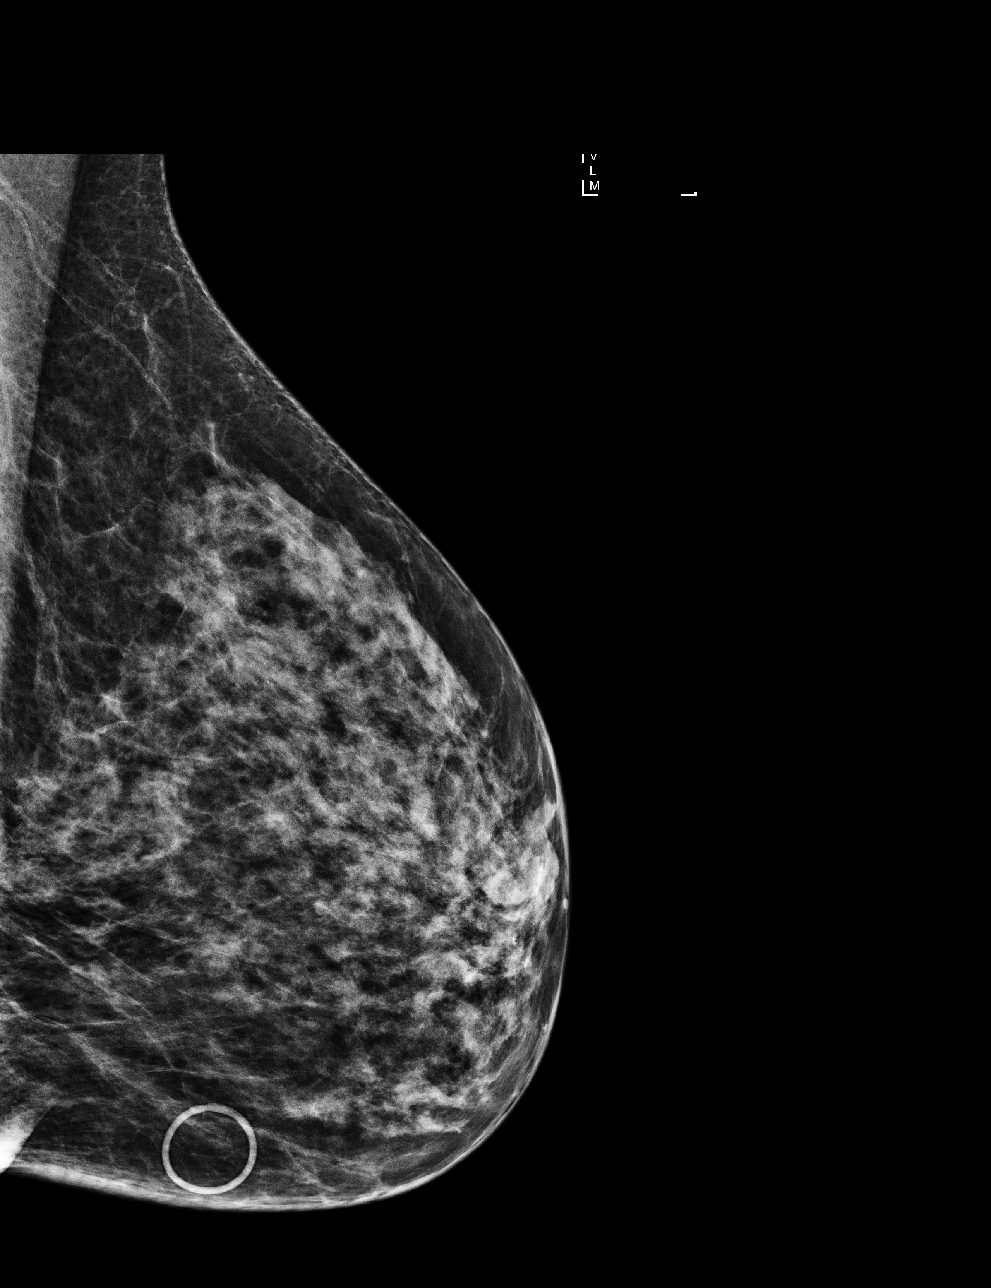

[R CC]
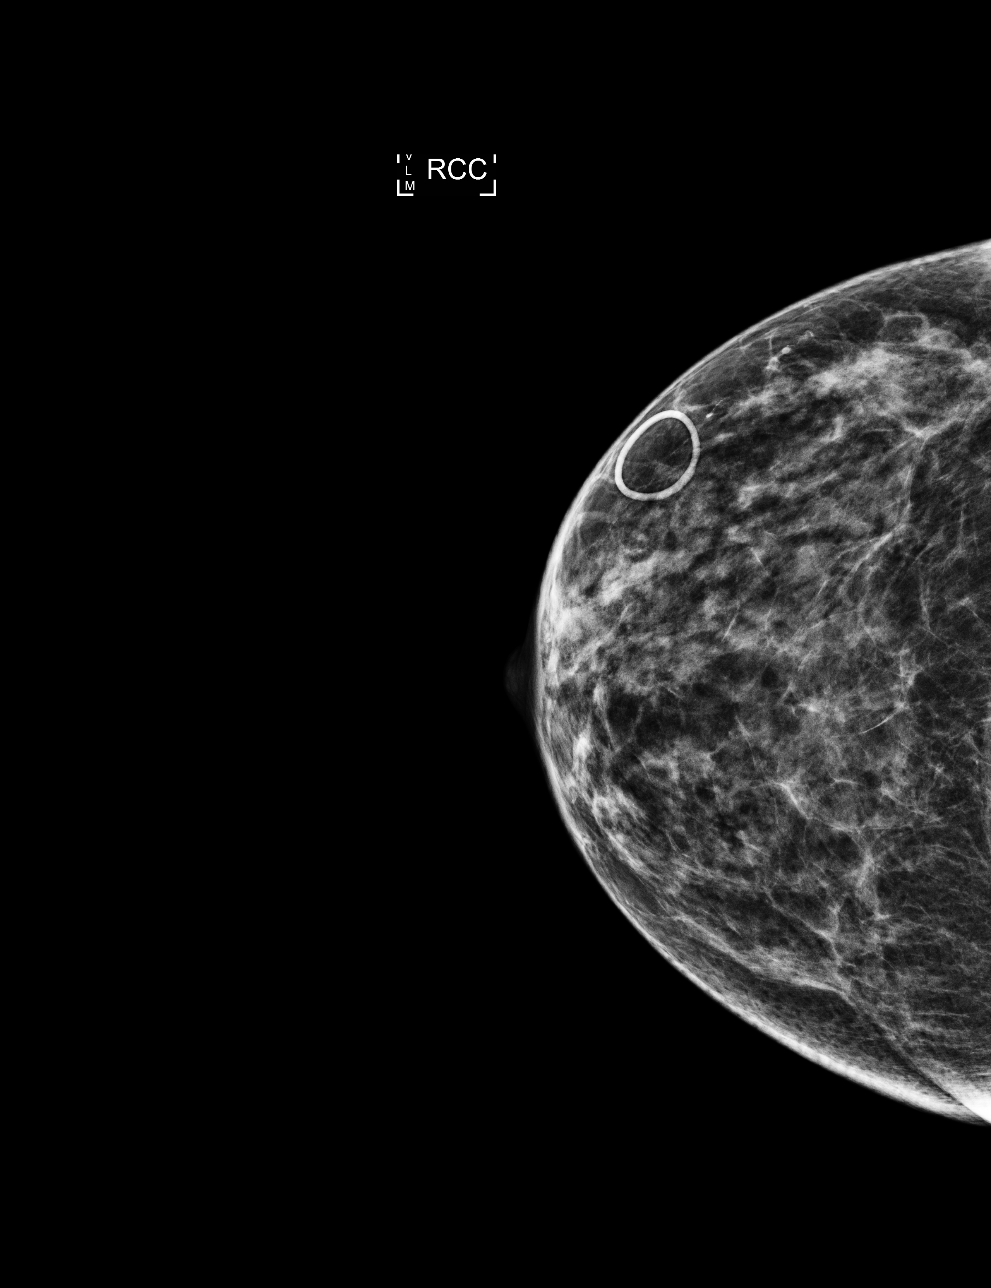

[L CC]
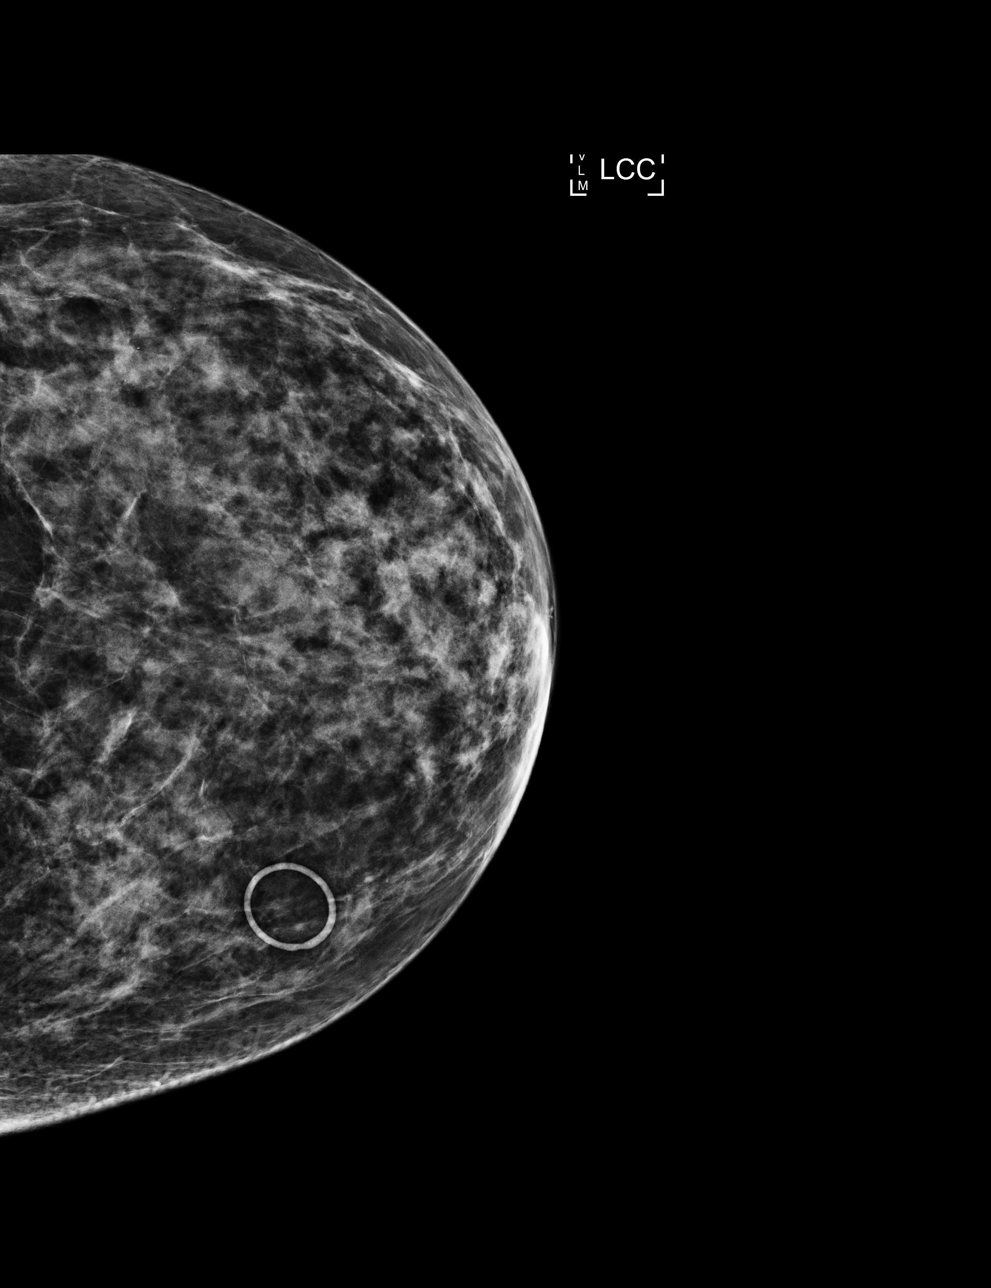

[4 of 4 positions shown; findings below may reference images not displayed]

ACR Breast Density Category d: The breast tissue is extremely dense,
which lowers the sensitivity of mammography.
FINDINGS: There are no findings suspicious for malignancy. Images were
processed with CAD. Evidence of benign right excisional biopsy.
IMPRESSION: No mammographic evidence of malignancy. A result letter of this
screening mammogram will be mailed directly to the patient.

RECOMMENDATION:
Screening mammogram in one year. (Code:[K3])

BI-RADS CATEGORY  2: Benign.

## 2014-09-18 ENCOUNTER — Encounter: Payer: Self-pay | Admitting: *Deleted

## 2014-12-02 ENCOUNTER — Telehealth: Payer: Self-pay | Admitting: Internal Medicine

## 2014-12-02 DIAGNOSIS — Z13820 Encounter for screening for osteoporosis: Secondary | ICD-10-CM

## 2014-12-02 NOTE — Telephone Encounter (Signed)
Referral is in process as requested 

## 2014-12-02 NOTE — Telephone Encounter (Signed)
Patient would like an order for a Bone Density to be done at [facility].

## 2014-12-02 NOTE — Telephone Encounter (Signed)
dexa ordered

## 2014-12-09 LAB — HM DEXA SCAN

## 2014-12-14 NOTE — Telephone Encounter (Signed)
No I have not seen it yet. Please request it,

## 2014-12-15 ENCOUNTER — Encounter: Payer: Self-pay | Admitting: *Deleted

## 2014-12-15 NOTE — Telephone Encounter (Signed)
Placed in your yellow folder  

## 2014-12-15 NOTE — Telephone Encounter (Signed)
Results requested

## 2014-12-16 ENCOUNTER — Telehealth: Payer: Self-pay | Admitting: Internal Medicine

## 2014-12-16 DIAGNOSIS — M810 Age-related osteoporosis without current pathological fracture: Secondary | ICD-10-CM | POA: Insufficient documentation

## 2014-12-16 DIAGNOSIS — M8580 Other specified disorders of bone density and structure, unspecified site: Secondary | ICD-10-CM

## 2014-12-16 NOTE — Telephone Encounter (Signed)
Set mychart with results

## 2014-12-16 NOTE — Telephone Encounter (Signed)
Her bone density has decreased significantly since her last one in 2005.  Would like to discuss starting therapy.  Please make apt

## 2014-12-28 ENCOUNTER — Ambulatory Visit (INDEPENDENT_AMBULATORY_CARE_PROVIDER_SITE_OTHER): Payer: PRIVATE HEALTH INSURANCE | Admitting: Internal Medicine

## 2014-12-28 ENCOUNTER — Encounter: Payer: Self-pay | Admitting: Internal Medicine

## 2014-12-28 VITALS — BP 124/78 | HR 64 | Temp 97.3°F | Resp 14 | Ht 62.0 in | Wt 122.0 lb

## 2014-12-28 DIAGNOSIS — M8580 Other specified disorders of bone density and structure, unspecified site: Secondary | ICD-10-CM

## 2014-12-28 DIAGNOSIS — E559 Vitamin D deficiency, unspecified: Secondary | ICD-10-CM | POA: Insufficient documentation

## 2014-12-28 DIAGNOSIS — K5909 Other constipation: Secondary | ICD-10-CM

## 2014-12-28 DIAGNOSIS — K5904 Chronic idiopathic constipation: Secondary | ICD-10-CM

## 2014-12-28 DIAGNOSIS — E538 Deficiency of other specified B group vitamins: Secondary | ICD-10-CM

## 2014-12-28 NOTE — Progress Notes (Signed)
Pre-visit discussion using our clinic review tool. No additional management support is needed unless otherwise documented below in the visit note.  

## 2014-12-28 NOTE — Patient Instructions (Addendum)
Your osteopenia has progressed by recent DEXA scan   You need to try to get 1500 mg calcium daily through diet and supplements.  I recommend getting the majority of your calcium and Vitamin D  through diet rather than supplements given the recent association of calcium supplements with increased coronary artery calcium scores   Unsweetened almond/coconut milk is a great low calorie low carb, cholesterol free  way to increase your dietary calcium and vitamin D.  Try the blue Jackquline Bosch  Try the [employer] (nature Path) and All bran mixture for high fiber cereal    Do not resume  folate,  Let's recheck in a few week s   Take 2000 units  Of D3 daily,  We will  check level in a few weeks

## 2014-12-28 NOTE — Progress Notes (Signed)
Patient ID: Erica Maldonado, female   DOB: 10/14/1951, 64 y.o.   MRN: [id number]     Patient Active Problem List   Diagnosis Date Noted  . Vitamin D deficiency 12/28/2014  . Osteopenia 12/16/2014  . Folic acid deficiency (non anemic) 05/02/2014  . Achilles tendonitis 04/21/2014  . Encounter for preventive health examination 08/26/2013  . Living will on file at physician's office 08/04/2013  . Abdominal bloating 05/21/2013  . Hemorrhoids 07/18/2011  . Constipation - functional 07/18/2011    Subjective:  CC:   Chief Complaint  Patient presents with  . Follow-up    Bone density   Her recent    Erica Maldonado is a 64 y.o. female who presents for follow up on recent DEXA scan.  She has  A history  of osteopenia and her recent  DEXA scan showed a significant  decrease iin T scores from 2005.  She has been physically active for many years  And has not had a fracture .  She has a calcium rich diet and does not smoke.     Past Medical History  Diagnosis Date  . Scleroderma   . Epistaxis     s/p cauterization  . Fibroadenoma     right breast  . Hemorrhoids, external, without mention of complication   . Abdominal or pelvic swelling, mass, or lump, left lower quadrant   . Umbilical hernia without mention of obstruction or gangrene   . Infectious diarrhea(009.2)     history of  . Other chest pain   . Other bursitis disorders   . Sleep disturbance, unspecified     Past Surgical History  Procedure Laterality Date  . Hysteroscopy  1995    normal       The following portions of the patient's history were reviewed and updated as appropriate: Allergies, current medications, and problem list.    Review of Systems:   Patient denies headache, fevers, malaise, unintentional weight loss, skin rash, eye pain, sinus congestion and sinus pain, sore throat, dysphagia,  hemoptysis , cough, dyspnea, wheezing, chest pain, palpitations, orthopnea, edema, abdominal pain, nausea, melena,  diarrhea, constipation, flank pain, dysuria, hematuria, urinary  Frequency, nocturia, numbness, tingling, seizures,  Focal weakness, Loss of consciousness,  Tremor, insomnia, depression, anxiety, and suicidal ideation.     History   Social History  . Marital Status: Married    Spouse Name: N/A    Number of Children: N/A  . Years of Education: N/A   Occupational History  . nurse- full time    Social History Main Topics  . Smoking status: Never Smoker   . Smokeless tobacco: Never Used     Comment: Remote history  . Alcohol Use: Yes     Comment: daily red wine  . Drug Use: No  . Sexual Activity: Not on file   Other Topics Concern  . Not on file   Social History Narrative   Lives with spouse, daughter.    Has a cat.   Always uses seat belts.   Heavy exercise.   Gets annual pap smears by gyn, has annual mammograms at [facility].    Objective:  Filed Vitals:   12/28/14 1653  BP: 124/78  Pulse: 64  Temp: 97.3 F (36.3 C)  Resp: 14     General appearance: alert, cooperative and appears stated age Ears: normal TM's and external ear canals both ears Throat: lips, mucosa, and tongue normal; teeth and gums normal Neck: no adenopathy, no carotid bruit, supple, symmetrical, trachea  midline and thyroid not enlarged, symmetric, no tenderness/mass/nodules Back: symmetric, no curvature. ROM normal. No CVA tenderness. Lungs: clear to auscultation bilaterally Heart: regular rate and rhythm, S1, S2 normal, no murmur, click, rub or gallop Abdomen: soft, non-tender; bowel sounds normal; no masses,  no organomegaly Pulses: 2+ and symmetric Skin: Skin color, texture, turgor normal. No rashes or lesions Lymph nodes: Cervical, supraclavicular, and axillary nodes normal.  Assessment and Plan:  Problem List Items Addressed This Visit    Vitamin D deficiency - Primary   Relevant Orders   Vit D  25 hydroxy (rtn osteoporosis monitoring)   Osteopenia    She is not interested in  pharmacotherapy at this time. Discussed the current controversies surrounding the risks and benefits of calcium supplementation.  Encouraged her to increase dietary calcium through natural foods including almond/coconut milk      Constipation - functional    Discussed additional sources of soluble fiber to add to diet.        Other Visit Diagnoses    Folic acid deficiency        Relevant Orders    Folate RBC      A total of 25 minutes of face to face time was spent with patient more than half of which was spent in counselling about the above mentioned conditions and coordination of care

## 2014-12-29 ENCOUNTER — Encounter: Payer: Self-pay | Admitting: Internal Medicine

## 2014-12-29 NOTE — Assessment & Plan Note (Signed)
Discussed additional sources of soluble fiber to add to diet.  

## 2014-12-29 NOTE — Assessment & Plan Note (Signed)
She is not interested in pharmacotherapy at this time. Discussed the current controversies surrounding the risks and benefits of calcium supplementation.  Encouraged her to increase dietary calcium through natural foods including almond/coconut milk 

## 2015-01-15 ENCOUNTER — Other Ambulatory Visit: Payer: Self-pay | Admitting: Internal Medicine

## 2015-01-15 DIAGNOSIS — E559 Vitamin D deficiency, unspecified: Secondary | ICD-10-CM

## 2015-01-16 LAB — VITAMIN D 25 HYDROXY (VIT D DEFICIENCY, FRACTURES): Vit D, 25-Hydroxy: 22 ng/mL — ABNORMAL LOW (ref 30–100)

## 2015-01-17 LAB — FOLATE RBC: RBC Folate: 771 ng/mL (ref >280)

## 2015-01-18 ENCOUNTER — Encounter: Payer: Self-pay | Admitting: Internal Medicine

## 2015-01-18 MED ORDER — ERGOCALCIFEROL 1.25 MG (50000 UT) PO CAPS: 50000.0000 [IU] | ORAL_CAPSULE | ORAL | Status: DC

## 2015-01-18 NOTE — Addendum Note (Signed)
Addended by: Crecencio Mc on: 01/18/2015 01:12 PM   Modules accepted: Orders

## 2015-01-18 NOTE — Assessment & Plan Note (Signed)
Drisdol 50K weekly x 4 wks for level of 22  in Feb 2016

## 2015-06-01 ENCOUNTER — Ambulatory Visit (INDEPENDENT_AMBULATORY_CARE_PROVIDER_SITE_OTHER): Payer: PRIVATE HEALTH INSURANCE | Admitting: Internal Medicine

## 2015-06-01 ENCOUNTER — Encounter: Payer: Self-pay | Admitting: Internal Medicine

## 2015-06-01 VITALS — BP 122/72 | HR 77 | Temp 97.4°F | Resp 12 | Ht 62.0 in | Wt 117.8 lb

## 2015-06-01 DIAGNOSIS — M546 Pain in thoracic spine: Secondary | ICD-10-CM

## 2015-06-01 DIAGNOSIS — M545 Low back pain: Secondary | ICD-10-CM

## 2015-06-01 DIAGNOSIS — M7052 Other bursitis of knee, left knee: Secondary | ICD-10-CM | POA: Diagnosis not present

## 2015-06-01 NOTE — Patient Instructions (Signed)
Your knee pain may have been from bursitis which was causing pressure on the peroneal nerve  Your back pain is due to muscle strain from using too much weight .

## 2015-06-01 NOTE — Progress Notes (Signed)
Subjective:  Patient ID: Erica Maldonado, female    DOB: [birth year]  Age: 64 y.o. MRN: [id number]  CC: The primary encounter diagnosis was Bursitis of knee, left. A diagnosis of Back pain of thoracolumbar region was also pertinent to this visit.  HPI Erica Maldonado presents for evaluation of right knee pain.  Patient reports onset of symptoms about 4 weeks ago which she describes a as burning sensation to lateral side of right knee when kneeling.  She works out regularly and had been doing lunges and squats regularly. She has noted improvement in her pain but now notes that the right tibia has a more pronounced bony prominence than the left , that is not painful.  2) low back pain.  Right side   Not severe. , feels like a muscle spasm , worse with bending over,  nonradiating  Outpatient Prescriptions Prior to Visit  Medication Sig Dispense Refill  . conjugated estrogens (PREMARIN) vaginal cream Use intravaginally 3 times weekly. 42.5 g 6  . fish oil-omega-3 fatty acids 1000 MG capsule Take 1,200 g by mouth daily.    . ergocalciferol (DRISDOL) 50000 UNITS capsule Take 1 capsule (50,000 Units total) by mouth once a week. 4 capsule 0  . Multiple Minerals-Vitamins (CALCIUM CITRATE PLUS PO) Take by mouth.    . Multiple Vitamins-Minerals (MULTIVITAMIN PO) Take by mouth.    . Cyanocobalamin 1000 MCG SUBL Place 1 tablet (1,000 mcg total) under the tongue daily. 90 tablet 3   No facility-administered medications prior to visit.    Review of Systems;  Patient denies headache, fevers, malaise, unintentional weight loss, skin rash, eye pain, sinus congestion and sinus pain, sore throat, dysphagia,  hemoptysis , cough, dyspnea, wheezing, chest pain, palpitations, orthopnea, edema, abdominal pain, nausea, melena, diarrhea, constipation, flank pain, dysuria, hematuria, urinary  Frequency, nocturia, numbness, tingling, seizures,  Focal weakness, Loss of consciousness,  Tremor, insomnia, depression, anxiety, and  suicidal ideation.      Objective:  BP 122/72 mmHg  Pulse 77  Temp(Src) 97.4 F (36.3 C) (Oral)  Resp 12  Ht 5\' 2"  (1.575 m)  Wt 117 lb 12.8 oz (53.434 kg)  BMI 21.54 kg/m2  SpO2 97%  BP Readings from Last 3 Encounters:  06/01/15 122/72  12/28/14 124/78  09/14/14 132/78    Wt Readings from Last 3 Encounters:  06/01/15 117 lb 12.8 oz (53.434 kg)  12/28/14 122 lb (55.339 kg)  09/14/14 121 lb 8 oz (55.112 kg)    General appearance: alert, cooperative and appears stated age Ears: normal TM's and external ear canals both ears Throat: lips, mucosa, and tongue normal; teeth and gums normal Neck: no adenopathy, no carotid bruit, supple, symmetrical, trachea midline and thyroid not enlarged, symmetric, no tenderness/mass/nodules Back: symmetric, no curvature. ROM normal. No CVA tenderness. Lungs: clear to auscultation bilaterally Heart: regular rate and rhythm, S1, S2 normal, no murmur, click, rub or gallop Abdomen: soft, non-tender; bowel sounds normal; no masses,  no organomegaly Pulses: 2+ and symmetric Skin: Skin color, texture, turgor normal. No rashes or lesions Lymph nodes: Cervical, supraclavicular, and axillary nodes normal.  No results found for: HGBA1C  Lab Results  Component Value Date   CREATININE 0.69 04/29/2014    Lab Results  Component Value Date   WBC 5.0 04/29/2014   HGB 14.2 04/29/2014   HCT 41.7 04/29/2014   PLT 215 04/29/2014   GLUCOSE 77 04/29/2014   CHOL 198 04/29/2014   TRIG 72 04/29/2014   HDL 77 04/29/2014   [place]  107* 04/29/2014   ALT 27 04/29/2014   AST 30 04/29/2014   NA 142 04/29/2014   K 4.4 04/29/2014   CL 104 04/29/2014   CREATININE 0.69 04/29/2014   BUN 15 04/29/2014   CO2 29 04/29/2014   TSH 2.076 04/29/2014    No results found.  Assessment & Plan:   Problem List Items Addressed This Visit      Unprioritized   Bursitis of knee - Primary    Right knee.  Now resolved,  counelling given to avoid lunges and squats  during workouts and to ice knee after workouts, prn use of NSAIDs.       Back pain of thoracolumbar region    Secondary to muscle strain from using too much weight during workouts. She has no vertebral tenderness.  counselling given.         A total of 25 minutes of face to face time was spent with patient more than half of which was spent in counselling about the above mentioned conditions  and coordination of care  I have discontinued Ms. Catino's Multiple Vitamins-Minerals (MULTIVITAMIN PO), Multiple Minerals-Vitamins (CALCIUM CITRATE PLUS PO), Cyanocobalamin, and ergocalciferol. I am also having her maintain her fish oil-omega-3 fatty acids, conjugated estrogens, cholecalciferol, and calcium-vitamin D.  Meds ordered this encounter  Medications  . cholecalciferol (VITAMIN D) 1000 UNITS tablet    Sig: Take 2,000 Units by mouth daily.  . calcium-vitamin D 250-100 MG-UNIT per tablet    Sig: Take 1 tablet by mouth 2 (two) times daily.    Medications Discontinued During This Encounter  Medication Reason  . Cyanocobalamin 1000 MCG SUBL Completed Course  . ergocalciferol (DRISDOL) 50000 UNITS capsule Completed Course  . Multiple Minerals-Vitamins (CALCIUM CITRATE PLUS PO) Completed Course  . Multiple Vitamins-Minerals (MULTIVITAMIN PO) Completed Course    Follow-up: No Follow-up on file.   Crecencio Mc, MD

## 2015-06-01 NOTE — Progress Notes (Signed)
Pre visit review using our clinic review tool, if applicable. No additional management support is needed unless otherwise documented below in the visit note. 

## 2015-06-04 DIAGNOSIS — M545 Low back pain, unspecified: Secondary | ICD-10-CM | POA: Insufficient documentation

## 2015-06-04 DIAGNOSIS — M7050 Other bursitis of knee, unspecified knee: Secondary | ICD-10-CM | POA: Insufficient documentation

## 2015-06-04 DIAGNOSIS — M546 Pain in thoracic spine: Secondary | ICD-10-CM

## 2015-06-04 NOTE — Assessment & Plan Note (Signed)
Secondary to muscle strain from using too much weight during workouts. She has no vertebral tenderness.  counselling given.

## 2015-06-04 NOTE — Assessment & Plan Note (Signed)
Right knee.  Now resolved,  counelling given to avoid lunges and squats during workouts and to ice knee after workouts, prn use of NSAIDs.

## 2015-06-07 ENCOUNTER — Encounter: Payer: Self-pay | Admitting: Internal Medicine

## 2015-06-07 ENCOUNTER — Ambulatory Visit (INDEPENDENT_AMBULATORY_CARE_PROVIDER_SITE_OTHER): Payer: PRIVATE HEALTH INSURANCE | Admitting: Internal Medicine

## 2015-06-07 VITALS — BP 106/62 | HR 70 | Temp 97.7°F | Resp 14 | Ht 62.0 in | Wt 118.0 lb

## 2015-06-07 DIAGNOSIS — S0502XA Injury of conjunctiva and corneal abrasion without foreign body, left eye, initial encounter: Secondary | ICD-10-CM | POA: Diagnosis not present

## 2015-06-07 MED ORDER — CIPROFLOXACIN HCL 0.3 % OP SOLN: 2.0000 [drp] | OPHTHALMIC | Status: DC

## 2015-06-07 NOTE — Patient Instructions (Signed)
I am treating your for bacterial conjunctivitis, but your symptoms may have started with a corneal abrasion and the treatment always includes an antibacterial solution to protect the eye .  Ciprofloxacin eye drops:  2 drops every 4 hours while awake for 5 to 7 days   Corneal Abrasion The cornea is the clear covering at the front and center of the eye. When looking at the colored portion of the eye (iris), you are looking through the cornea. This very thin tissue is made up of many layers. The surface layer is a single layer of cells (corneal epithelium) and is one of the most sensitive tissues in the body. If a scratch or injury causes the corneal epithelium to come off, it is called a corneal abrasion. If the injury extends to the tissues below the epithelium, the condition is called a corneal ulcer. CAUSES   Scratches.  Trauma.  Foreign body in the eye. Some people have recurrences of abrasions in the area of the original injury even after it has healed (recurrent erosion syndrome). Recurrent erosion syndrome generally improves and goes away with time. SYMPTOMS   Eye pain.  Difficulty or inability to keep the injured eye open.  The eye becomes very sensitive to light.  Recurrent erosions tend to happen suddenly, first thing in the morning, usually after waking up and opening the eye. DIAGNOSIS  Your health care provider can diagnose a corneal abrasion during an eye exam. Dye is usually placed in the eye using a drop or a small paper strip moistened by your tears. When the eye is examined with a special light, the abrasion shows up clearly because of the dye. TREATMENT   Small abrasions may be treated with antibiotic drops or ointment alone.  A pressure patch may be put over the eye. If this is done, follow your doctor's instructions for when to remove the patch. Do not drive or use machines while the eye patch is on. Judging distances is hard to do with a patch on. If the abrasion  becomes infected and spreads to the deeper tissues of the cornea, a corneal ulcer can result. This is serious because it can cause corneal scarring. Corneal scars interfere with light passing through the cornea and cause a loss of vision in the involved eye. HOME CARE INSTRUCTIONS  Use medicine or ointment as directed. Only take over-the-counter or prescription medicines for pain, discomfort, or fever as directed by your health care provider.  Do not drive or operate machinery if your eye is patched. Your ability to judge distances is impaired.  If your health care provider has given you a follow-up appointment, it is very important to keep that appointment. Not keeping the appointment could result in a severe eye infection or permanent loss of vision. If there is any problem keeping the appointment, let your health care provider know. SEEK MEDICAL CARE IF:   You have pain, light sensitivity, and a scratchy feeling in one eye or both eyes.  Your pressure patch keeps loosening up, and you can blink your eye under the patch after treatment.  Any kind of discharge develops from the eye after treatment or if the lids stick together in the morning.  You have the same symptoms in the morning as you did with the original abrasion days, weeks, or months after the abrasion healed. MAKE SURE YOU:   Understand these instructions.  Will watch your condition.  Will get help right away if you are not doing well or  get worse. Document Released: 11/03/2000 Document Revised: 11/11/2013 Document Reviewed: 07/14/2013 [facility] Patient Information 2015 [place], [place]. This information is not intended to replace advice given to you by your health care provider. Make sure you discuss any questions you have with your health care provider.

## 2015-06-07 NOTE — Progress Notes (Signed)
Subjective:  Patient ID: Erica Maldonado, female    DOB: Apr 14, 1951  Age: 64 y.o. MRN: [id number]  CC: The encounter diagnosis was Corneal abrasion, left, initial encounter.  HPI Erica Maldonado presents for painful left eye.  Started thursday morning,  after flying to [facility] the night before.  Globe became bloodshot,  nose was running,  Eye hurt and was tearing, felt like glass scraping the eye every time the lid closed. and vision was blurred .  Wears contact ;lebses,  But her  use was two weeks prior. No history of glaucoma or hypertension. No recetn sinsu infectio or URI >  .    Outpatient Prescriptions Prior to Visit  Medication Sig Dispense Refill  . calcium-vitamin D 250-100 MG-UNIT per tablet Take 1 tablet by mouth 2 (two) times daily.    . cholecalciferol (VITAMIN D) 1000 UNITS tablet Take 2,000 Units by mouth daily.    Marland Kitchen conjugated estrogens (PREMARIN) vaginal cream Use intravaginally 3 times weekly. 42.5 g 6  . fish oil-omega-3 fatty acids 1000 MG capsule Take 1,200 g by mouth daily.     No facility-administered medications prior to visit.    Review of Systems;  Patient denies headache, fevers, malaise, unintentional weight loss, skin rash, eye pain, sinus congestion and sinus pain, sore throat, dysphagia,  hemoptysis , cough, dyspnea, wheezing, chest pain, palpitations, orthopnea, edema, abdominal pain, nausea, melena, diarrhea, constipation, flank pain, dysuria, hematuria, urinary  Frequency, nocturia, numbness, tingling, seizures,  Focal weakness, Loss of consciousness,  Tremor, insomnia, depression, anxiety, and suicidal ideation.      Objective:  BP 106/62 mmHg  Pulse 70  Temp(Src) 97.7 F (36.5 C) (Oral)  Resp 14  Ht 5\' 2"  (1.575 m)  Wt 118 lb (53.524 kg)  BMI 21.58 kg/m2  SpO2 99%  BP Readings from Last 3 Encounters:  06/07/15 106/62  06/01/15 122/72  12/28/14 124/78    Wt Readings from Last 3 Encounters:  06/07/15 118 lb (53.524 kg)  06/01/15 117 lb 12.8 oz  (53.434 kg)  12/28/14 122 lb (55.339 kg)    General appearance: alert, cooperative and appears stated age Eyes:  Left globe normal appearing, no discharge or matted eye lashes.  Mild pain with palpation of eyeball Vision: all fields intact, able to read newspaper at arms length  No results found for: HGBA1C  Lab Results  Component Value Date   CREATININE 0.69 04/29/2014    Lab Results  Component Value Date   WBC 5.0 04/29/2014   HGB 14.2 04/29/2014   HCT 41.7 04/29/2014   PLT 215 04/29/2014   GLUCOSE 77 04/29/2014   CHOL 198 04/29/2014   TRIG 72 04/29/2014   HDL 77 04/29/2014   LDLCALC 107* 04/29/2014   ALT 27 04/29/2014   AST 30 04/29/2014   NA 142 04/29/2014   K 4.4 04/29/2014   CL 104 04/29/2014   CREATININE 0.69 04/29/2014   BUN 15 04/29/2014   CO2 29 04/29/2014   TSH 2.076 04/29/2014    No results found.  Assessment & Plan:   Problem List Items Addressed This Visit      Unprioritized   Corneal abrasion, left - Primary    Vs conjunctivitis,  cipro otic drops for 5 days.          I am having Ms. Labus start on ciprofloxacin. I am also having her maintain her fish oil-omega-3 fatty acids, conjugated estrogens, cholecalciferol, and calcium-vitamin D.  Meds ordered this encounter  Medications  . ciprofloxacin (CILOXAN)  0.3 % ophthalmic solution    Sig: Place 2 drops into the left eye every 4 (four) hours while awake. Administer 1 drop, every 2 hours, while awake, for 2 days. Then 1 drop, every 4 hours, while awake, for the next 5 days.    Dispense:  5 mL    Refill:  0    There are no discontinued medications.  Follow-up: No Follow-up on file.   Crecencio Mc, MD

## 2015-06-07 NOTE — Progress Notes (Signed)
Pre-visit discussion using our clinic review tool. No additional management support is needed unless otherwise documented below in the visit note.  

## 2015-06-08 DIAGNOSIS — S0502XA Injury of conjunctiva and corneal abrasion without foreign body, left eye, initial encounter: Secondary | ICD-10-CM | POA: Insufficient documentation

## 2015-06-08 NOTE — Assessment & Plan Note (Signed)
Vs conjunctivitis,  cipro otic drops for 5 days.

## 2015-08-04 ENCOUNTER — Other Ambulatory Visit: Payer: Self-pay | Admitting: Internal Medicine

## 2015-08-04 DIAGNOSIS — Z1231 Encounter for screening mammogram for malignant neoplasm of breast: Secondary | ICD-10-CM

## 2015-09-20 ENCOUNTER — Ambulatory Visit: Payer: PRIVATE HEALTH INSURANCE

## 2015-09-21 ENCOUNTER — Ambulatory Visit
Admission: RE | Admit: 2015-09-21 | Discharge: 2015-09-21 | Disposition: A | Discharge status: Home / Self Care | Payer: PRIVATE HEALTH INSURANCE | Source: Ambulatory Visit | Attending: Internal Medicine | Admitting: Internal Medicine

## 2015-09-21 DIAGNOSIS — Z1231 Encounter for screening mammogram for malignant neoplasm of breast: Secondary | ICD-10-CM | POA: Diagnosis present

## 2015-09-21 IMAGING — MG MM DIGITAL SCREENING BILATERAL
6 series · 6 of 6 positions shown · non-contrast
Comparison: Previous exam(s).

CLINICAL DATA: Screening.

EXAM:
DIGITAL SCREENING BILATERAL MAMMOGRAM WITH CAD

[R CC]
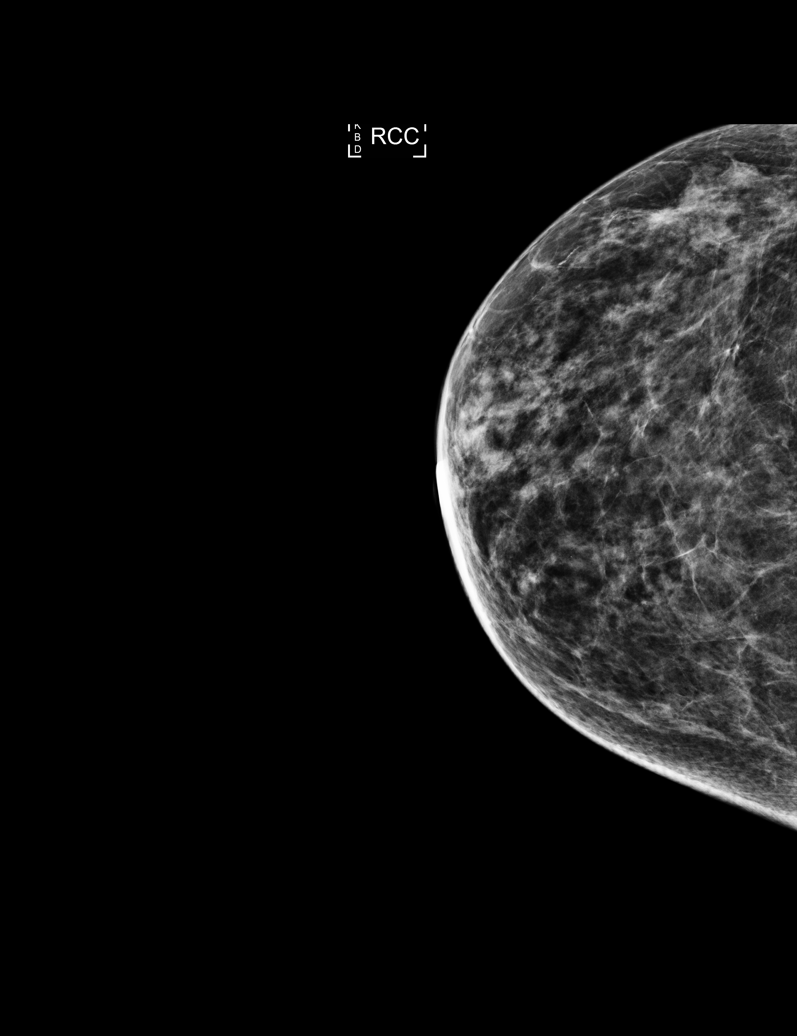

[L CC (1 of 2)]
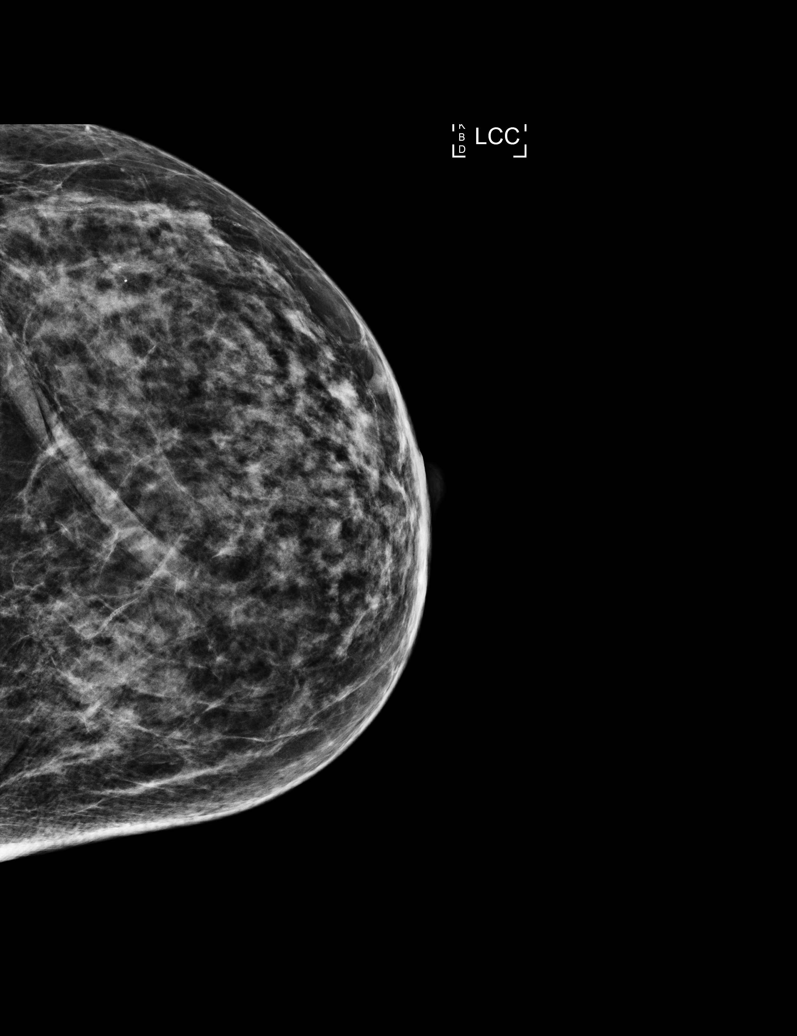

[R MLO (1 of 2)]
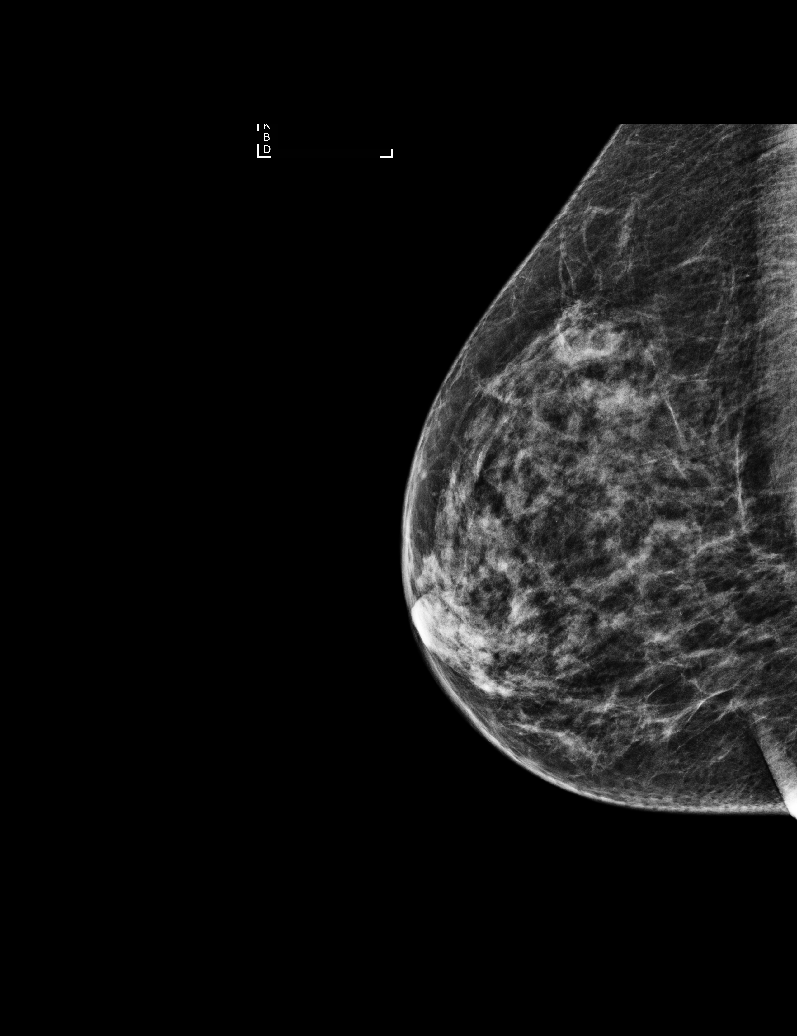

[R MLO (2 of 2)]
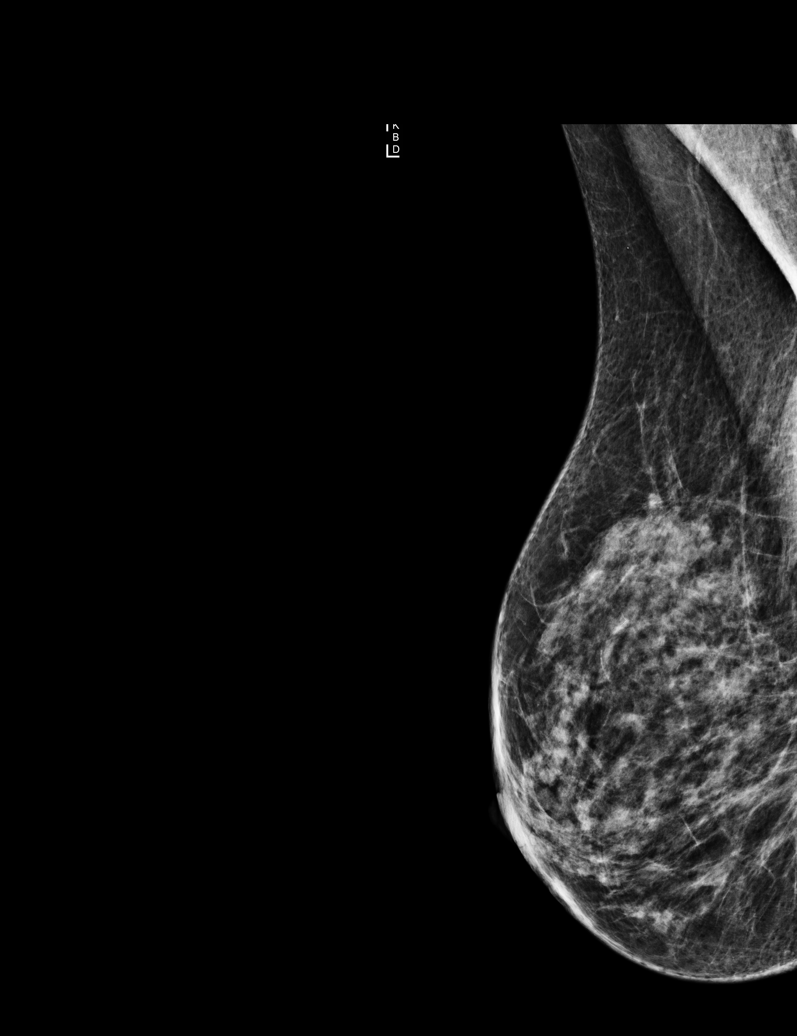

[L CC (2 of 2)]
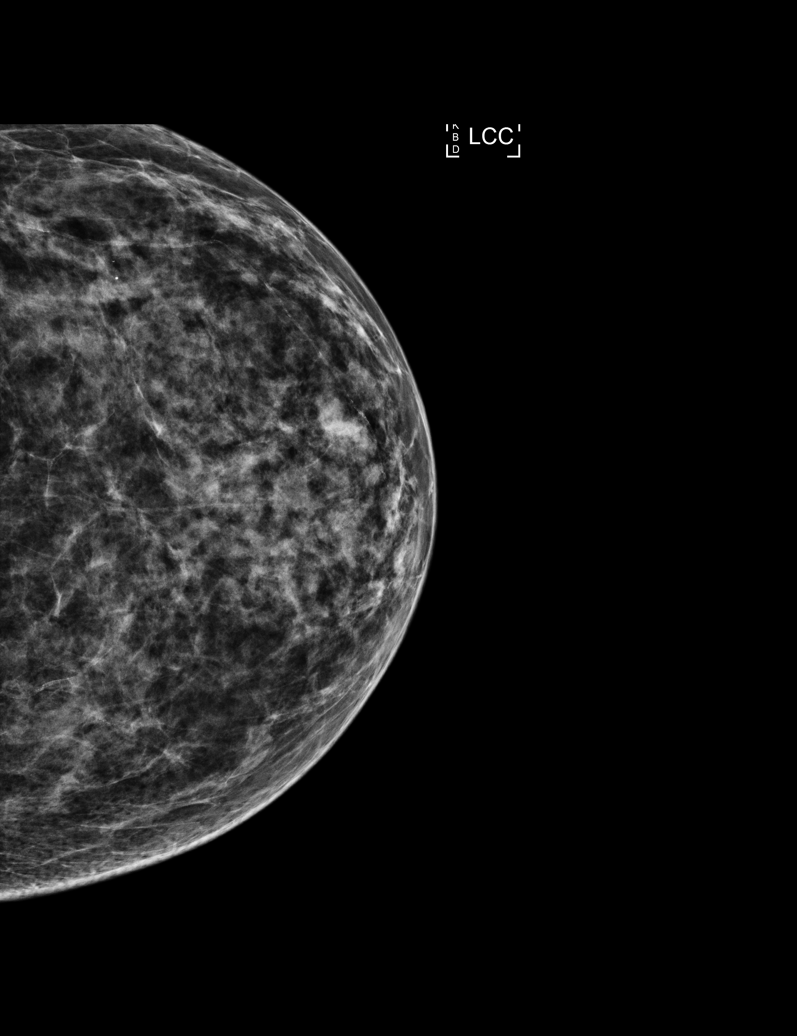

[L MLO]
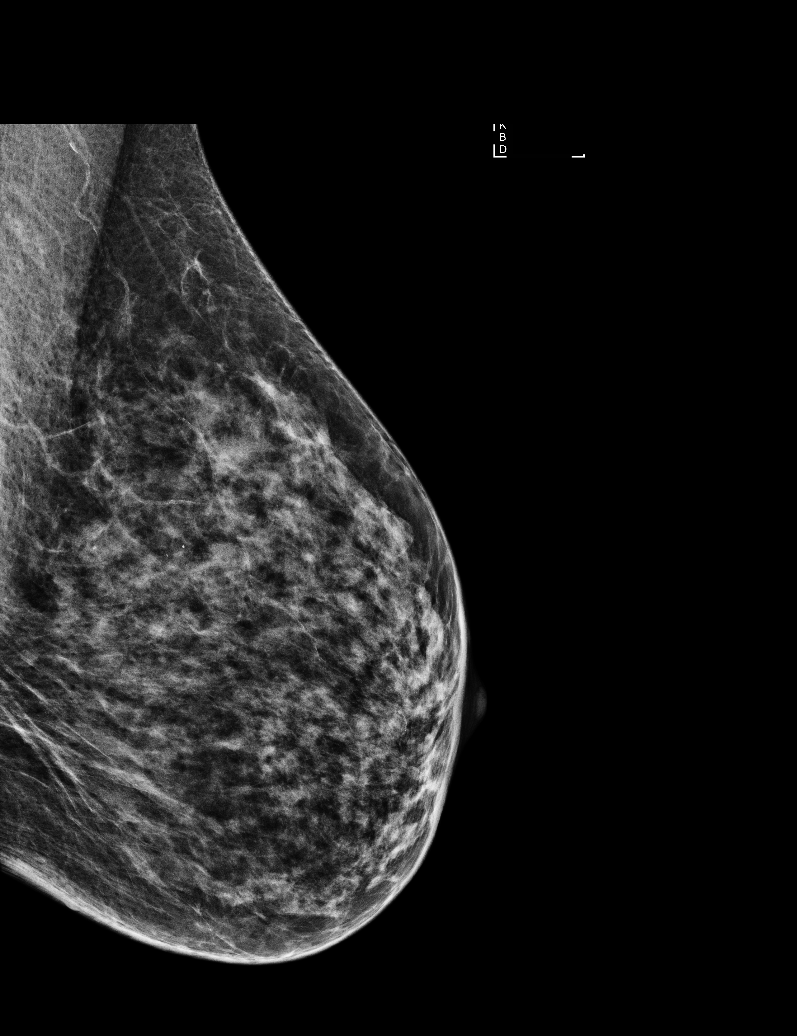

[6 of 6 positions shown; findings below may reference images not displayed]

ACR Breast Density Category c: The breast tissue is heterogeneously
dense, which may obscure small masses.
FINDINGS: There are no findings suspicious for malignancy. Images were
processed with CAD.
IMPRESSION: No mammographic evidence of malignancy. A result letter of this
screening mammogram will be mailed directly to the patient.

RECOMMENDATION:
Screening mammogram in one year. (Code:[0J])

BI-RADS CATEGORY  1: Negative.

## 2015-09-22 ENCOUNTER — Encounter: Payer: Self-pay | Admitting: *Deleted

## 2015-09-22 ENCOUNTER — Other Ambulatory Visit: Payer: Self-pay | Admitting: General Surgery

## 2015-09-22 ENCOUNTER — Ambulatory Visit (INDEPENDENT_AMBULATORY_CARE_PROVIDER_SITE_OTHER): Payer: PRIVATE HEALTH INSURANCE | Admitting: General Surgery

## 2015-09-22 VITALS — BP 116/62 | HR 72 | Resp 12 | Ht 62.0 in | Wt 115.0 lb

## 2015-09-22 DIAGNOSIS — L989 Disorder of the skin and subcutaneous tissue, unspecified: Secondary | ICD-10-CM | POA: Diagnosis not present

## 2015-09-22 NOTE — Progress Notes (Signed)
Patient ID: Starnisha Batrez, female   DOB: 1951/01/24, 64 y.o.   MRN: [id number]  Chief Complaint  Patient presents with  . Skin Problem    right calf    HPI Machel Violante is a 64 y.o. female. Here today for evaluation of right calf mole. She states she noticed this about three years ago. This summer she states the mole has got bigger. It is frequently traumatized. No bleeding. It has gone from being flat to raised.  I personally confirmed the above history.   HPI  Past Medical History  Diagnosis Date  . Scleroderma ([place])   . Epistaxis     s/p cauterization  . Fibroadenoma     right breast  . Hemorrhoids, external, without mention of complication   . Abdominal or pelvic swelling, mass, or lump, left lower quadrant   . Umbilical hernia without mention of obstruction or gangrene   . Infectious diarrhea(009.2)     history of  . Other chest pain   . Other bursitis disorders   . Sleep disturbance, unspecified     Past Surgical History  Procedure Laterality Date  . Hysteroscopy  1995    normal/ Dr Laurey Morale  . Breast biopsy Right 1988    fibroadenoma removed  . Breast biopsy Right 1995    Dr Bary Castilla  . Dilation and curettage of uterus  2001    Dr Laurey Morale  . Colonoscopy  2001, 2011    Dr Vira Agar    Family History  Problem Relation Age of Onset  . Cancer Mother     breast, developed uterine cancer while on tamoxifen  . Breast cancer Mother   . Cancer Brother     melanoma/scalp    Social History Social History  Substance Use Topics  . Smoking status: Never Smoker   . Smokeless tobacco: Never Used     Comment: Remote history  . Alcohol Use: Yes     Comment: daily red wine    Allergies  Allergen Reactions  . Naprosyn [Naproxen] Swelling    Throat felt like it was closing  . Biaxin [Clarithromycin] Other (See Comments)    Tongue blisters  . Clarithromycin Rash    Blisters on tongue    Current Outpatient Prescriptions  Medication Sig Dispense Refill  .  calcium-vitamin D 250-100 MG-UNIT per tablet Take 1 tablet by mouth 2 (two) times daily.    . cholecalciferol (VITAMIN D) 1000 UNITS tablet Take 2,000 Units by mouth daily.    Marland Kitchen docusate sodium (COLACE) 100 MG capsule Take 100 mg by mouth 2 (two) times daily.    . fish oil-omega-3 fatty acids 1000 MG capsule Take 1,200 g by mouth 2 (two) times daily.      No current facility-administered medications for this visit.    Review of Systems Review of Systems  Constitutional: Negative.   Respiratory: Negative.   Cardiovascular: Negative.     Blood pressure 116/62, pulse 72, resp. rate 12, height 5\' 2"  (1.575 m), weight 115 lb (52.164 kg).  Physical Exam Physical Exam  Constitutional: She is oriented to person, place, and time. She appears well-developed and well-nourished.  Neurological: She is alert and oriented to person, place, and time.  Skin: Skin is dry.         Assessment    Symptomatic skin nodule right posterior leg.    Plan    It was elected to proceed to excision. 10 mL of 0.5% Xylocaine with 0.25% Marcaine with 1-200,000 units of epinephrine was  utilized well tolerated. ChloraPrep was applied to the skin. A vertical elliptical incision was utilized. A suture was placed the 12:00 position for orientation. The wound was approximated with interrupted 4-0 nylon sutures. Telfa and Tegaderm dressing applied. The procedure was well tolerated. Scant bleeding was noted.  Postoperative wound care was reviewed. She'll return in one week for suture removal.     PCP:  Mattie Marlin 09/22/2015, 3:14 PM

## 2015-09-22 NOTE — Patient Instructions (Signed)
The patient is aware to call back for any questions or concerns.  

## 2015-09-23 ENCOUNTER — Ambulatory Visit (INDEPENDENT_AMBULATORY_CARE_PROVIDER_SITE_OTHER): Payer: PRIVATE HEALTH INSURANCE | Admitting: Internal Medicine

## 2015-09-23 ENCOUNTER — Encounter: Payer: Self-pay | Admitting: Internal Medicine

## 2015-09-23 VITALS — BP 124/78 | HR 75 | Temp 98.2°F | Ht 62.0 in | Wt 115.4 lb

## 2015-09-23 DIAGNOSIS — Z0000 Encounter for general adult medical examination without abnormal findings: Secondary | ICD-10-CM | POA: Diagnosis not present

## 2015-09-23 NOTE — Progress Notes (Signed)
Patient ID: Erica Maldonado, female    DOB: 1951-08-10  Age: 64 y.o. MRN: [id number]  The patient is here for annual wellness examination and management of other chronic and acute problems.   PAP normal August 2014 Mammogram done Nov 2016  Colonoscopy 2013 normal [place] Had mole on right calf resected by Dr Bary Castilla after lawn chair kept irritating it,  Biopsy : verrica vulgaris  WEIGHT LOSS OF 7 LBS NOTED THIS YEAR  Intentional,  Wants to be 89.  The risk factors are reflected in the social history.  The roster of all physicians providing medical care to patient - is listed in the Snapshot section of the chart. Home safety : The patient has smoke detectors in the home. They wear seatbelts.  There are no firearms at home. There is no violence in the home.   There is no risks for hepatitis, STDs or HIV. There is no   history of blood transfusion. They have no travel history to infectious disease endemic areas of the world.  The patient has seen their dentist in the last six month. They have seen their eye doctor in the last year. They admit to slight hearing difficulty with regard to whispered voices and some television programs.  They have deferred audiologic testing in the last year.  They do not  have excessive sun exposure. Discussed the need for sun protection: hats, long sleeves and use of sunscreen if there is significant sun exposure.   Diet: the importance of a healthy diet is discussed. They do have a healthy diet.  The benefits of regular aerobic exercise were discussed. She walks 4 times per week ,  20 minutes.   Depression screen: there are no signs or vegative symptoms of depression- irritability, change in appetite, anhedonia, sadness/tearfullness.   The following portions of the patient's history were reviewed and updated as appropriate: allergies, current medications, past family history, past medical history,  past surgical history, past social history  and problem  list.  Visual acuity was not assessed per patient preference since she has regular follow up with her ophthalmologist. Hearing and body mass index were assessed and reviewed.   During the course of the visit the patient was educated and counseled about appropriate screening and preventive services including : fall prevention , diabetes screening, nutrition counseling, colorectal cancer screening, and recommended immunizations.    CC: The encounter diagnosis was Encounter for preventive health examination.  History Keyoni has a past medical history of Scleroderma ([place]); Epistaxis; Fibroadenoma; Hemorrhoids, external, without mention of complication; Abdominal or pelvic swelling, mass, or lump, left lower quadrant; Umbilical hernia without mention of obstruction or gangrene; Infectious diarrhea(009.2); Other chest pain; Other bursitis disorders; and Sleep disturbance, unspecified.   She has past surgical history that includes Hysteroscopy (1995); Breast biopsy (Right, 1988); Breast biopsy (Right, 1995); Dilation and curettage of uterus (2001); and Colonoscopy (2001, 2011).   Her family history includes Breast cancer in her mother; Cancer in her brother and mother.She reports that she has never smoked. She has never used smokeless tobacco. She reports that she drinks alcohol. She reports that she does not use illicit drugs.  Outpatient Prescriptions Prior to Visit  Medication Sig Dispense Refill  . calcium-vitamin D 250-100 MG-UNIT per tablet Take 1 tablet by mouth 2 (two) times daily.    . cholecalciferol (VITAMIN D) 1000 UNITS tablet Take 2,000 Units by mouth daily.    Marland Kitchen docusate sodium (COLACE) 100 MG capsule Take 100 mg by mouth 2 (two)  times daily.    . fish oil-omega-3 fatty acids 1000 MG capsule Take 1,200 g by mouth 2 (two) times daily.      No facility-administered medications prior to visit.    Review of Systems  Patient denies headache, fevers, malaise, unintentional weight loss,  skin rash, eye pain, sinus congestion and sinus pain, sore throat, dysphagia,  hemoptysis , cough, dyspnea, wheezing, chest pain, palpitations, orthopnea, edema, abdominal pain, nausea, melena, diarrhea, constipation, flank pain, dysuria, hematuria, urinary  Frequency, nocturia, numbness, tingling, seizures,  Focal weakness, Loss of consciousness,  Tremor, insomnia, depression, anxiety, and suicidal ideation.     Objective:  BP 124/78 mmHg  Pulse 75  Temp(Src) 98.2 F (36.8 C) (Oral)  Ht 5\' 2"  (1.575 m)  Wt 115 lb 6.4 oz (52.345 kg)  BMI 21.10 kg/m2  SpO2 99%  Physical Exam  General appearance: alert, cooperative and appears stated age Head: Normocephalic, without obvious abnormality, atraumatic Eyes: conjunctivae/corneas clear. PERRL, EOM's intact. Fundi benign. Ears: normal TM's and external ear canals both ears Nose: Nares normal. Septum midline. Mucosa normal. No drainage or sinus tenderness. Throat: lips, mucosa, and tongue normal; teeth and gums normal Neck: no adenopathy, no carotid bruit, no JVD, supple, symmetrical, trachea midline and thyroid not enlarged, symmetric, no tenderness/mass/nodules Lungs: clear to auscultation bilaterally Breasts: normal appearance, no masses or tenderness Heart: regular rate and rhythm, S1, S2 normal, no murmur, click, rub or gallop Abdomen: soft, non-tender; bowel sounds normal; no masses,  no organomegaly Extremities: extremities normal, atraumatic, no cyanosis or edema Pulses: 2+ and symmetric Skin: Skin color, texture, turgor normal. No rashes or lesions Neurologic: Alert and oriented X 3, normal strength and tone. Normal symmetric reflexes. Normal coordination and gait.    Assessment & Plan:   Problem List Items Addressed This Visit    Encounter for preventive health examination - Primary    Annual comprehensive preventive exam was done as well as an evaluation and management of chronic conditions .  During the course of the visit the  patient was educated and counseled about appropriate screening and preventive services including :  diabetes screening, lipid analysis with projected  10 year  risk for CAD  , nutrition counseling, colorectal cancer screening, and recommended immunizations.  Printed recommendations for health maintenance screenings was given.          I am having Ms. Hanaway maintain her fish oil-omega-3 fatty acids, cholecalciferol, calcium-vitamin D, and docusate sodium.  No orders of the defined types were placed in this encounter.    There are no discontinued medications.  Follow-up: No Follow-up on file.   Crecencio Mc, MD

## 2015-09-23 NOTE — Patient Instructions (Signed)
Try the [employer]akes cereal  By [employer] path, available at [facility] on the bottom shelf  [employer] sells an organic almond mik, non refrigerated,  Called :"orgain"  That is fortified with protein and very low carb   Menopause is a normal process in which your reproductive ability comes to an end. This process happens gradually over a span of months to years, usually between the ages of 62 and 14. Menopause is complete when you have missed 12 consecutive menstrual periods. It is important to talk with your health care provider about some of the most common conditions that affect postmenopausal women, such as heart disease, cancer, and bone loss (osteoporosis). Adopting a healthy lifestyle and getting preventive care can help to promote your health and wellness. Those actions can also lower your chances of developing some of these common conditions. WHAT SHOULD I KNOW ABOUT MENOPAUSE? During menopause, you may experience a number of symptoms, such as:  Moderate-to-severe hot flashes.  Night sweats.  Decrease in sex drive.  Mood swings.  Headaches.  Tiredness.  Irritability.  Memory problems.  Insomnia. Choosing to treat or not to treat menopausal changes is an individual decision that you make with your health care provider. WHAT SHOULD I KNOW ABOUT HORMONE REPLACEMENT THERAPY AND SUPPLEMENTS? Hormone therapy products are effective for treating symptoms that are associated with menopause, such as hot flashes and night sweats. Hormone replacement carries certain risks, especially as you become older. If you are thinking about using estrogen or estrogen with progestin treatments, discuss the benefits and risks with your health care provider. WHAT SHOULD I KNOW ABOUT HEART DISEASE AND STROKE? Heart disease, heart attack, and stroke become more likely as you age. This may be due, in part, to the hormonal changes that your body experiences during menopause. These can affect how your body  processes dietary fats, triglycerides, and cholesterol. Heart attack and stroke are both medical emergencies. There are many things that you can do to help prevent heart disease and stroke:  Have your blood pressure checked at least every 1-2 years. High blood pressure causes heart disease and increases the risk of stroke.  If you are 75-44 years old, ask your health care provider if you should take aspirin to prevent a heart attack or a stroke.  Do not use any tobacco products, including cigarettes, chewing tobacco, or electronic cigarettes. If you need help quitting, ask your health care provider.  It is important to eat a healthy diet and maintain a healthy weight.  Be sure to include plenty of vegetables, fruits, low-fat dairy products, and lean protein.  Avoid eating foods that are high in solid fats, added sugars, or salt (sodium).  Get regular exercise. This is one of the most important things that you can do for your health.  Try to exercise for at least 150 minutes each week. The type of exercise that you do should increase your heart rate and make you sweat. This is known as moderate-intensity exercise.  Try to do strengthening exercises at least twice each week. Do these in addition to the moderate-intensity exercise.  Know your numbers.Ask your health care provider to check your cholesterol and your blood glucose. Continue to have your blood tested as directed by your health care provider. WHAT SHOULD I KNOW ABOUT CANCER SCREENING? There are several types of cancer. Take the following steps to reduce your risk and to catch any cancer development as early as possible. Breast Cancer  Practice breast self-awareness.  This means understanding  how your breasts normally appear and feel.  It also means doing regular breast self-exams. Let your health care provider know about any changes, no matter how small.  If you are 29 or older, have a clinician do a breast exam (clinical  breast exam or CBE) every year. Depending on your age, family history, and medical history, it may be recommended that you also have a yearly breast X-ray (mammogram).  If you have a family history of breast cancer, talk with your health care provider about genetic screening.  If you are at high risk for breast cancer, talk with your health care provider about having an MRI and a mammogram every year.  Breast cancer (BRCA) gene test is recommended for women who have family members with BRCA-related cancers. Results of the assessment will determine the need for genetic counseling and BRCA1 and for BRCA2 testing. BRCA-related cancers include these types:  Breast. This occurs in males or females.  Ovarian.  Tubal. This may also be called fallopian tube cancer.  Cancer of the abdominal or pelvic lining (peritoneal cancer).  Prostate.  Pancreatic. Cervical, Uterine, and Ovarian Cancer Your health care provider may recommend that you be screened regularly for cancer of the pelvic organs. These include your ovaries, uterus, and vagina. This screening involves a pelvic exam, which includes checking for microscopic changes to the surface of your cervix (Pap test).  For women ages 21-65, health care providers may recommend a pelvic exam and a Pap test every three years. For women ages 39-65, they may recommend the Pap test and pelvic exam, combined with testing for human papilloma virus (HPV), every five years. Some types of HPV increase your risk of cervical cancer. Testing for HPV may also be done on women of any age who have unclear Pap test results.  Other health care providers may not recommend any screening for nonpregnant women who are considered low risk for pelvic cancer and have no symptoms. Ask your health care provider if a screening pelvic exam is right for you.  If you have had past treatment for cervical cancer or a condition that could lead to cancer, you need Pap tests and screening  for cancer for at least 20 years after your treatment. If Pap tests have been discontinued for you, your risk factors (such as having a new sexual partner) need to be reassessed to determine if you should start having screenings again. Some women have medical problems that increase the chance of getting cervical cancer. In these cases, your health care provider may recommend that you have screening and Pap tests more often.  If you have a family history of uterine cancer or ovarian cancer, talk with your health care provider about genetic screening.  If you have vaginal bleeding after reaching menopause, tell your health care provider.  There are currently no reliable tests available to screen for ovarian cancer. Lung Cancer Lung cancer screening is recommended for adults 83-31 years old who are at high risk for lung cancer because of a history of smoking. A yearly low-dose CT scan of the lungs is recommended if you:  Currently smoke.  Have a history of at least 30 pack-years of smoking and you currently smoke or have quit within the past 15 years. A pack-year is smoking an average of one pack of cigarettes per day for one year. Yearly screening should:  Continue until it has been 15 years since you quit.  Stop if you develop a health problem that would prevent  you from having lung cancer treatment. Colorectal Cancer  This type of cancer can be detected and can often be prevented.  Routine colorectal cancer screening usually begins at age 29 and continues through age 26.  If you have risk factors for colon cancer, your health care provider may recommend that you be screened at an earlier age.  If you have a family history of colorectal cancer, talk with your health care provider about genetic screening.  Your health care provider may also recommend using home test kits to check for hidden blood in your stool.  A small camera at the end of a tube can be used to examine your colon  directly (sigmoidoscopy or colonoscopy). This is done to check for the earliest forms of colorectal cancer.  Direct examination of the colon should be repeated every 5-10 years until age 97. However, if early forms of precancerous polyps or small growths are found or if you have a family history or genetic risk for colorectal cancer, you may need to be screened more often. Skin Cancer  Check your skin from head to toe regularly.  Monitor any moles. Be sure to tell your health care provider:  About any new moles or changes in moles, especially if there is a change in a mole's shape or color.  If you have a mole that is larger than the size of a pencil eraser.  If any of your family members has a history of skin cancer, especially at a young age, talk with your health care provider about genetic screening.  Always use sunscreen. Apply sunscreen liberally and repeatedly throughout the day.  Whenever you are outside, protect yourself by wearing long sleeves, pants, a wide-brimmed hat, and sunglasses. WHAT SHOULD I KNOW ABOUT OSTEOPOROSIS? Osteoporosis is a condition in which bone destruction happens more quickly than new bone creation. After menopause, you may be at an increased risk for osteoporosis. To help prevent osteoporosis or the bone fractures that can happen because of osteoporosis, the following is recommended:  If you are 11-68 years old, get at least 1,000 mg of calcium and at least 600 mg of vitamin D per day.  If you are older than age 15 but younger than age 5, get at least 1,200 mg of calcium and at least 600 mg of vitamin D per day.  If you are older than age 11, get at least 1,200 mg of calcium and at least 800 mg of vitamin D per day. Smoking and excessive alcohol intake increase the risk of osteoporosis. Eat foods that are rich in calcium and vitamin D, and do weight-bearing exercises several times each week as directed by your health care provider. WHAT SHOULD I KNOW  ABOUT HOW MENOPAUSE AFFECTS [place]? Depression may occur at any age, but it is more common as you become older. Common symptoms of depression include:  Low or sad mood.  Changes in sleep patterns.  Changes in appetite or eating patterns.  Feeling an overall lack of motivation or enjoyment of activities that you previously enjoyed.  Frequent crying spells. Talk with your health care provider if you think that you are experiencing depression. WHAT SHOULD I KNOW ABOUT IMMUNIZATIONS? It is important that you get and maintain your immunizations. These include:  Tetanus, diphtheria, and pertussis (Tdap) booster vaccine.  Influenza every year before the flu season begins.  Pneumonia vaccine.  Shingles vaccine. Your health care provider may also recommend other immunizations.   This information is not intended to  replace advice given to you by your health care provider. Make sure you discuss any questions you have with your health care provider.   Document Released: 12/29/2005 Document Revised: 11/27/2014 Document Reviewed: 07/09/2014 Elsevier Interactive Patient Education [employer].

## 2015-09-23 NOTE — Progress Notes (Signed)
Pre visit review using our clinic review tool, if applicable. No additional management support is needed unless otherwise documented below in the visit note. 

## 2015-09-26 NOTE — Assessment & Plan Note (Signed)
Annual comprehensive preventive exam was done as well as an evaluation and management of chronic conditions .  During the course of the visit the patient was educated and counseled about appropriate screening and preventive services including :  diabetes screening, lipid analysis with projected  10 year  risk for CAD , nutrition counseling, colorectal cancer screening, and recommended immunizations.  Printed recommendations for health maintenance screenings was given.   

## 2015-09-27 ENCOUNTER — Telehealth: Payer: Self-pay

## 2015-09-27 NOTE — Telephone Encounter (Signed)
Notified patient as instructed, patient pleased. Discussed follow-up appointments, patient agrees  

## 2015-09-27 NOTE — Telephone Encounter (Signed)
-----   Message from Robert Bellow, MD sent at 09/27/2015  9:02 AM EST ----- Please notify [facility]ie path showed a viral lesion. Margins clear. F/U for suture removal as previously planned. ----- Message -----    From: Lab in [facility]    Sent: 09/24/2015   3:47 PM      To: Robert Bellow, MD

## 2015-09-29 ENCOUNTER — Ambulatory Visit (INDEPENDENT_AMBULATORY_CARE_PROVIDER_SITE_OTHER): Payer: PRIVATE HEALTH INSURANCE | Admitting: *Deleted

## 2015-09-29 DIAGNOSIS — L989 Disorder of the skin and subcutaneous tissue, unspecified: Secondary | ICD-10-CM

## 2015-09-29 NOTE — Progress Notes (Signed)
Patient came in today for a wound check.  The wound is clean, with no signs of infection noted. The sutures were removed and steri strips applied.  

## 2016-01-12 ENCOUNTER — Telehealth: Payer: Self-pay | Admitting: *Deleted

## 2016-01-12 ENCOUNTER — Other Ambulatory Visit (INDEPENDENT_AMBULATORY_CARE_PROVIDER_SITE_OTHER): Payer: Medicare Other

## 2016-01-12 DIAGNOSIS — E785 Hyperlipidemia, unspecified: Secondary | ICD-10-CM

## 2016-01-12 DIAGNOSIS — R5383 Other fatigue: Secondary | ICD-10-CM | POA: Diagnosis not present

## 2016-01-12 DIAGNOSIS — E559 Vitamin D deficiency, unspecified: Secondary | ICD-10-CM | POA: Diagnosis not present

## 2016-01-12 DIAGNOSIS — Z7289 Other problems related to lifestyle: Secondary | ICD-10-CM | POA: Diagnosis not present

## 2016-01-12 LAB — CBC WITH DIFFERENTIAL/PLATELET
BASOS ABS: 0 10*3/uL (ref 0.0–0.1)
Basophils Relative: 0.4 % (ref 0.0–3.0)
Eosinophils Absolute: 0 10*3/uL (ref 0.0–0.7)
Eosinophils Relative: 0.4 % (ref 0.0–5.0)
HEMATOCRIT: 41.6 % (ref 36.0–46.0)
Hemoglobin: 13.9 g/dL (ref 12.0–15.0)
LYMPHS PCT: 17.2 % (ref 12.0–46.0)
Lymphs Abs: 1.1 10*3/uL (ref 0.7–4.0)
MCHC: 33.3 g/dL (ref 30.0–36.0)
MCV: 89.4 fl (ref 78.0–100.0)
MONOS PCT: 5.9 % (ref 3.0–12.0)
Monocytes Absolute: 0.4 10*3/uL (ref 0.1–1.0)
NEUTROS ABS: 4.8 10*3/uL (ref 1.4–7.7)
NEUTROS PCT: 76.1 % (ref 43.0–77.0)
PLATELETS: 230 10*3/uL (ref 150.0–400.0)
RBC: 4.66 Mil/uL (ref 3.87–5.11)
RDW: 13.7 % (ref 11.5–15.5)
WBC: 6.3 10*3/uL (ref 4.0–10.5)

## 2016-01-12 LAB — COMPREHENSIVE METABOLIC PANEL
ALBUMIN: 4.5 g/dL (ref 3.5–5.2)
ALT: 15 U/L (ref 0–35)
AST: 21 U/L (ref 0–37)
Alkaline Phosphatase: 57 U/L (ref 39–117)
BUN: 16 mg/dL (ref 6–23)
CALCIUM: 9.4 mg/dL (ref 8.4–10.5)
CHLORIDE: 104 meq/L (ref 96–112)
CO2: 32 meq/L (ref 19–32)
Creatinine, Ser: 0.76 mg/dL (ref 0.40–1.20)
GFR: 81.13 mL/min (ref >60.00)
Glucose, Bld: 94 mg/dL (ref 70–99)
POTASSIUM: 3.9 meq/L (ref 3.5–5.1)
Sodium: 142 mEq/L (ref 135–145)
Total Bilirubin: 0.6 mg/dL (ref 0.2–1.2)
Total Protein: 6.8 g/dL (ref 6.0–8.3)

## 2016-01-12 LAB — VITAMIN D 25 HYDROXY (VIT D DEFICIENCY, FRACTURES): VITD: 42.99 ng/mL (ref 30.00–100.00)

## 2016-01-12 LAB — LIPID PANEL
Cholesterol: 207 mg/dL — ABNORMAL HIGH (ref 0–200)
HDL: 74.9 mg/dL (ref >39.00)
LDL Cholesterol: 117 mg/dL — ABNORMAL HIGH (ref 0–99)
NONHDL: 132.26
TRIGLYCERIDES: 77 mg/dL (ref 0.0–149.0)
Total CHOL/HDL Ratio: 3
VLDL: 15.4 mg/dL (ref 0.0–40.0)

## 2016-01-12 NOTE — Telephone Encounter (Signed)
Pt on lab schedule today for fasting orders. Needs orders placed.

## 2016-01-13 LAB — HEPATITIS C ANTIBODY: HCV AB: NEGATIVE

## 2016-01-13 LAB — TSH: TSH: 1.82 u[IU]/mL (ref 0.35–4.50)

## 2016-01-16 ENCOUNTER — Encounter: Payer: Self-pay | Admitting: Internal Medicine

## 2016-02-24 DIAGNOSIS — D1801 Hemangioma of skin and subcutaneous tissue: Secondary | ICD-10-CM | POA: Diagnosis not present

## 2016-07-18 ENCOUNTER — Telehealth: Payer: Self-pay | Admitting: Internal Medicine

## 2016-07-18 NOTE — Telephone Encounter (Signed)
Yes you are so we can order at visit or after November first.

## 2016-07-18 NOTE — Telephone Encounter (Signed)
Am I correct, needs to be done oneyear post mammogram last year? Which would be in November, thanks

## 2016-07-18 NOTE — Telephone Encounter (Signed)
Spoke with the patient, she will be out of town prior to the appt in November, she would like the mammogram to be on the 7th in the afternoon so that they can discuss at her appt on the 8th.,.

## 2016-07-18 NOTE — Telephone Encounter (Signed)
Pt called and made an appt. For a physical on 09/27/16 with Dr. Derrel Nip. Pt would like a referral for her yearly mammogram.

## 2016-07-21 NOTE — Telephone Encounter (Signed)
Can you schedule this mammogram for me I can't get anyone to pick up the phone 09/26/16 is when patient prefers. Erica Maldonado.

## 2016-07-21 NOTE — Telephone Encounter (Signed)
That's fine

## 2016-07-21 NOTE — Telephone Encounter (Signed)
I'm sorry I just saw this. Yes I will but will have to wait till Monday b/c the schedulers leave at 3 on Friday's.

## 2016-07-24 ENCOUNTER — Encounter: Payer: Self-pay | Admitting: Internal Medicine

## 2016-07-24 DIAGNOSIS — Z1239 Encounter for other screening for malignant neoplasm of breast: Secondary | ICD-10-CM

## 2016-07-25 ENCOUNTER — Other Ambulatory Visit: Payer: Self-pay | Admitting: Internal Medicine

## 2016-07-25 DIAGNOSIS — Z1231 Encounter for screening mammogram for malignant neoplasm of breast: Secondary | ICD-10-CM

## 2016-09-26 ENCOUNTER — Ambulatory Visit
Admission: RE | Admit: 2016-09-26 | Discharge: 2016-09-26 | Disposition: A | Discharge status: Home / Self Care | Payer: Medicare Other | Source: Ambulatory Visit | Attending: Internal Medicine | Admitting: Internal Medicine

## 2016-09-26 DIAGNOSIS — Z1231 Encounter for screening mammogram for malignant neoplasm of breast: Secondary | ICD-10-CM

## 2016-09-26 IMAGING — MG 2D DIGITAL SCREENING BILATERAL MAMMOGRAM WITH CAD AND ADJUNCT TO
9 of 12 series · 9 of 28 positions shown · non-contrast
Comparison: Previous exam(s).

CLINICAL DATA: Screening.

EXAM:
2D DIGITAL SCREENING BILATERAL MAMMOGRAM WITH CAD AND ADJUNCT TOMO

[L CC synth-2D]
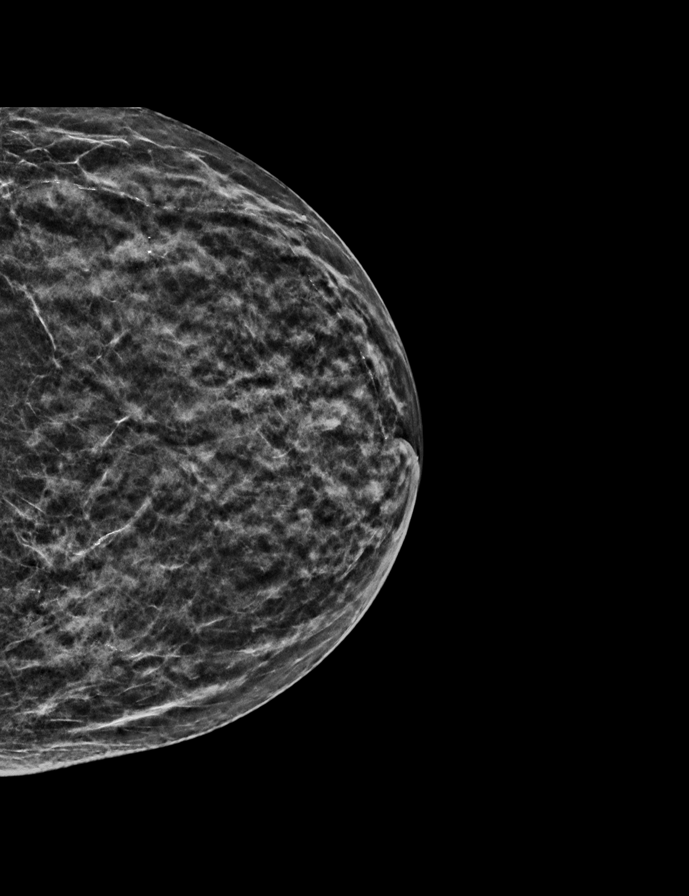

[R MLO]
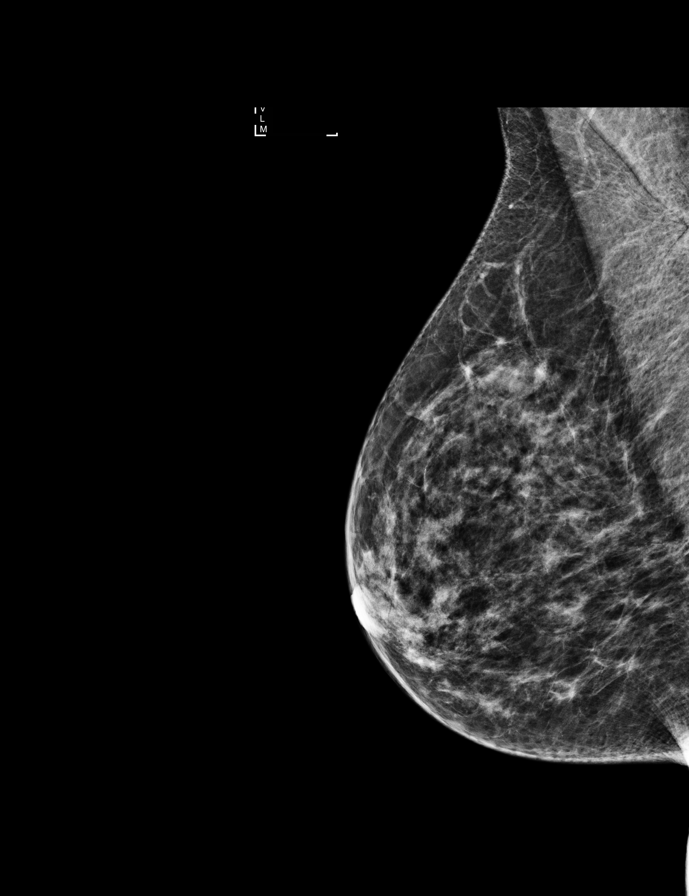

[R CC synth-2D]
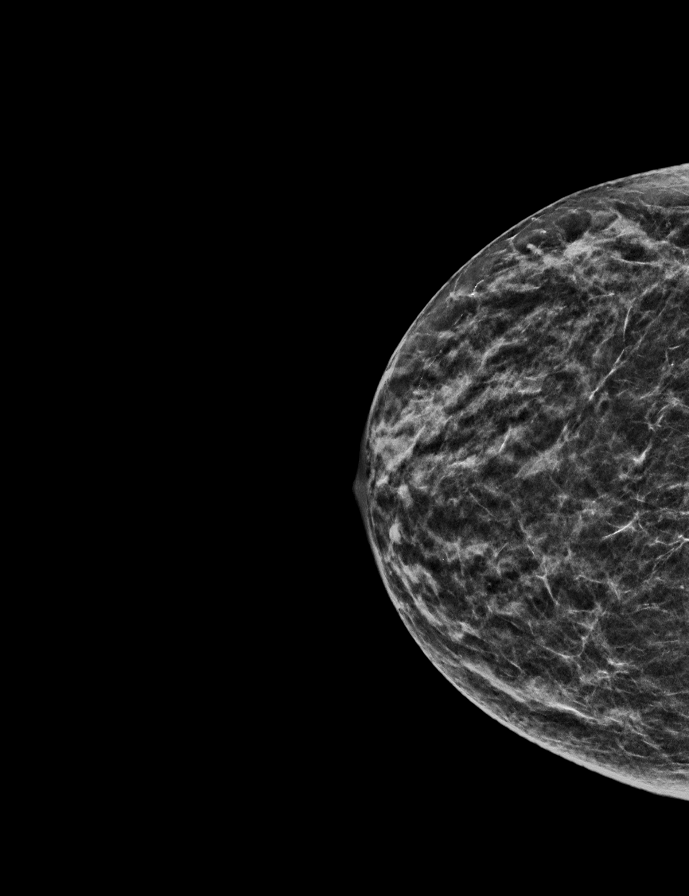

[R CC]
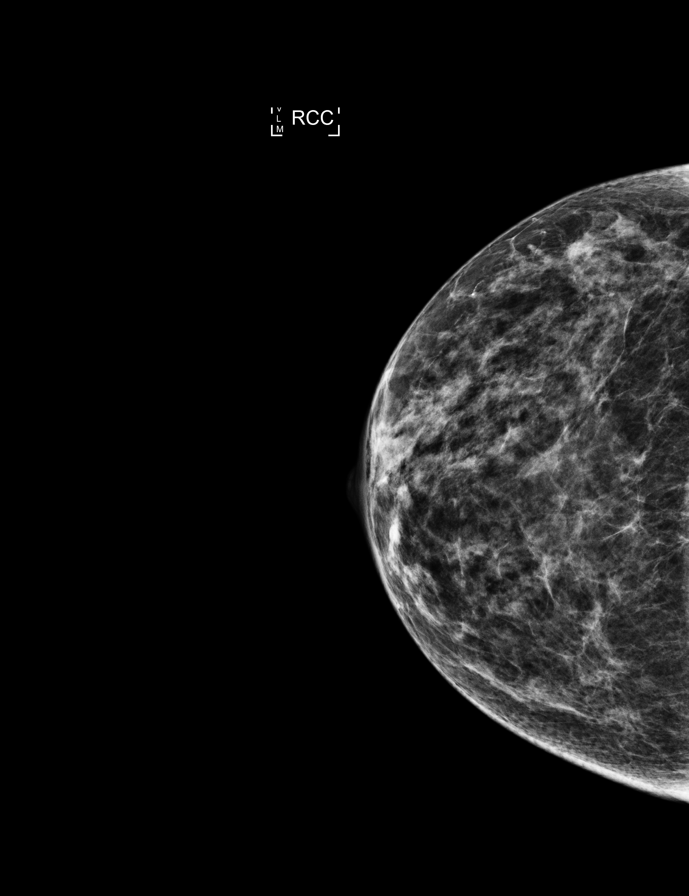

[L CC]
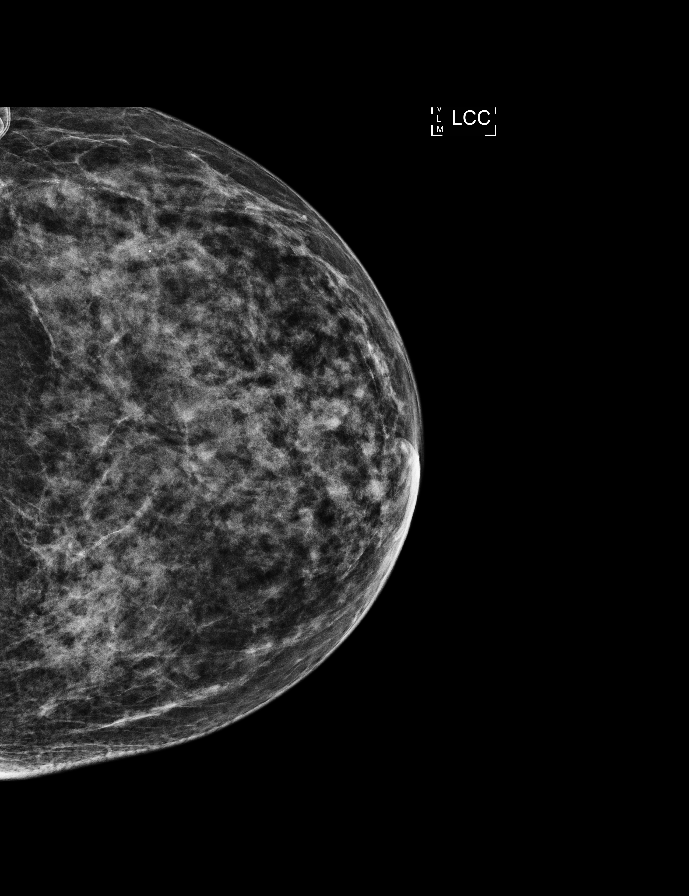

[L MLO synth-2D]
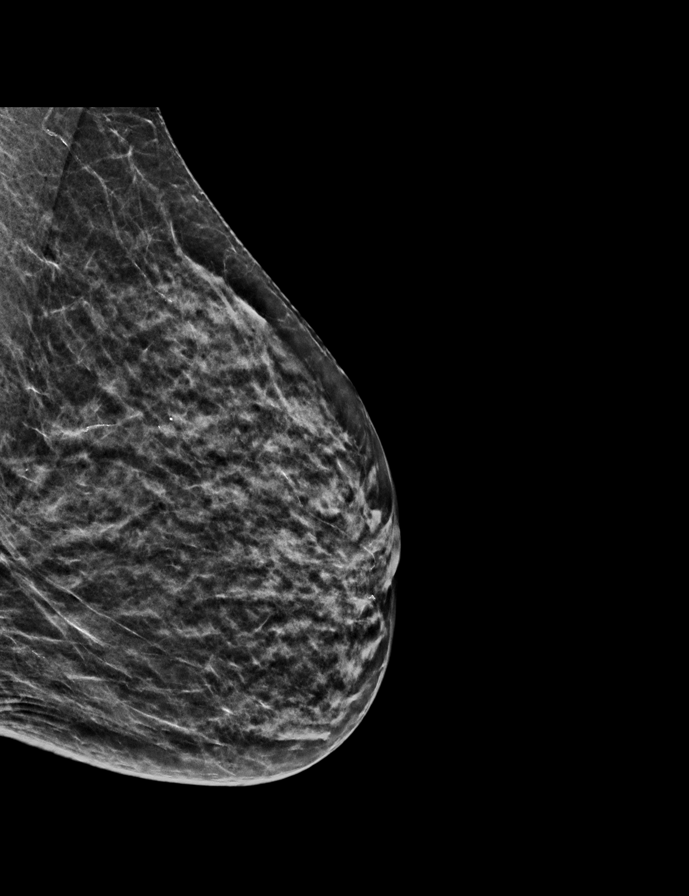

[L MLO]
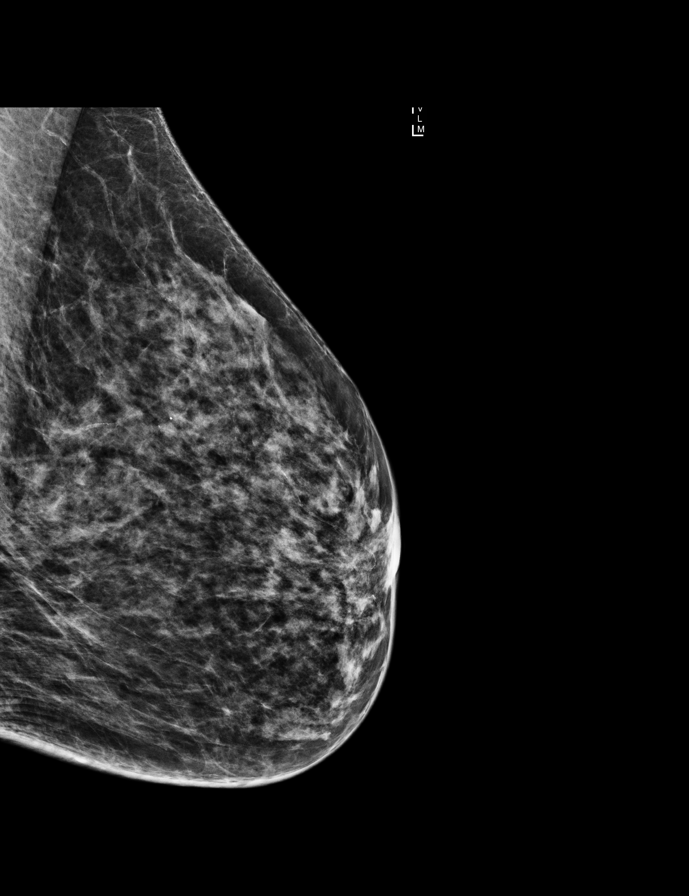

[R MLO synth-2D]
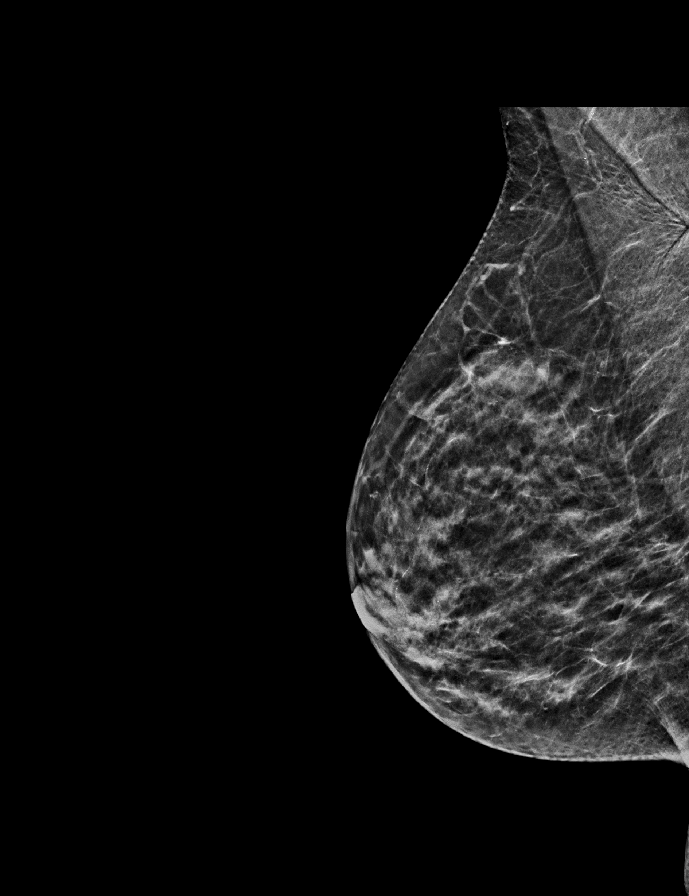

[R CC tomo · tomo slice 20/39.0]
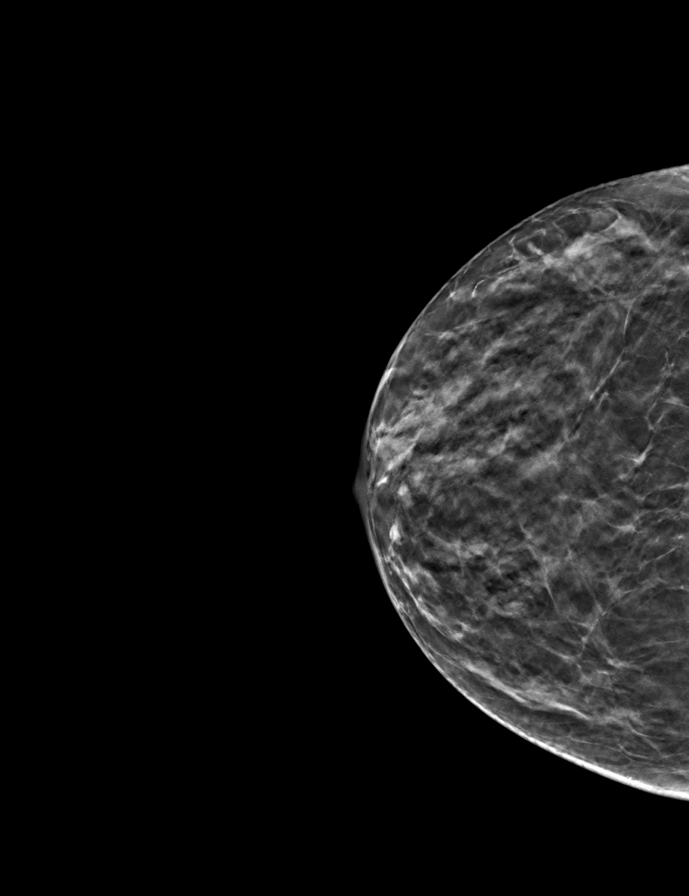

[9 of 28 positions shown; findings below may reference images not displayed]

ACR Breast Density Category c: The breast tissue is heterogeneously
dense, which may obscure small masses.
FINDINGS: There are no findings suspicious for malignancy. Images were
processed with CAD.
IMPRESSION: No mammographic evidence of malignancy. A result letter of this
screening mammogram will be mailed directly to the patient.

RECOMMENDATION:
Screening mammogram in one year. (Code:[TA])

BI-RADS CATEGORY  1: Negative.

## 2016-09-27 ENCOUNTER — Encounter: Payer: Self-pay | Admitting: Internal Medicine

## 2016-09-27 ENCOUNTER — Other Ambulatory Visit (HOSPITAL_COMMUNITY)
Admission: RE | Admit: 2016-09-27 | Discharge: 2016-09-27 | Disposition: A | Discharge status: Home / Self Care | Payer: Medicare Other | Source: Ambulatory Visit | Attending: Internal Medicine | Admitting: Internal Medicine

## 2016-09-27 ENCOUNTER — Ambulatory Visit (INDEPENDENT_AMBULATORY_CARE_PROVIDER_SITE_OTHER): Payer: Medicare Other | Admitting: Internal Medicine

## 2016-09-27 VITALS — BP 136/72 | HR 68 | Temp 97.8°F | Resp 12 | Ht 62.0 in | Wt 119.2 lb

## 2016-09-27 DIAGNOSIS — Z1151 Encounter for screening for human papillomavirus (HPV): Secondary | ICD-10-CM | POA: Diagnosis not present

## 2016-09-27 DIAGNOSIS — M8588 Other specified disorders of bone density and structure, other site: Secondary | ICD-10-CM

## 2016-09-27 DIAGNOSIS — Z01419 Encounter for gynecological examination (general) (routine) without abnormal findings: Secondary | ICD-10-CM | POA: Insufficient documentation

## 2016-09-27 DIAGNOSIS — Z0000 Encounter for general adult medical examination without abnormal findings: Secondary | ICD-10-CM | POA: Diagnosis not present

## 2016-09-27 DIAGNOSIS — Z124 Encounter for screening for malignant neoplasm of cervix: Secondary | ICD-10-CM

## 2016-09-27 MED ORDER — ESTRADIOL 0.1 MG/GM VA CREA: 1.0000 | TOPICAL_CREAM | VAGINAL | 12 refills | Status: DC

## 2016-09-27 NOTE — Patient Instructions (Addendum)
You are due for the Prevnar vaccine today, but you have declined.  If you change you mind come back!  PAP smear done today  No labs needed.  Everything was fine in February   Menopause is a normal process in which your reproductive ability comes to an end. This process happens gradually over a span of months to years, usually between the ages of 48 and 55. Menopause is complete when you have missed 12 consecutive menstrual periods. It is important to talk with your health care provider about some of the most common conditions that affect postmenopausal women, such as heart disease, cancer, and bone loss (osteoporosis). Adopting a healthy lifestyle and getting preventive care can help to promote your health and wellness. Those actions can also lower your chances of developing some of these common conditions. WHAT SHOULD I KNOW ABOUT MENOPAUSE? During menopause, you may experience a number of symptoms, such as:  Moderate-to-severe hot flashes.  Night sweats.  Decrease in sex drive.  Mood swings.  Headaches.  Tiredness.  Irritability.  Memory problems.  Insomnia. Choosing to treat or not to treat menopausal changes is an individual decision that you make with your health care provider. WHAT SHOULD I KNOW ABOUT HORMONE REPLACEMENT THERAPY AND SUPPLEMENTS? Hormone therapy products are effective for treating symptoms that are associated with menopause, such as hot flashes and night sweats. Hormone replacement carries certain risks, especially as you become older. If you are thinking about using estrogen or estrogen with progestin treatments, discuss the benefits and risks with your health care provider. WHAT SHOULD I KNOW ABOUT HEART DISEASE AND STROKE? Heart disease, heart attack, and stroke become more likely as you age. This may be due, in part, to the hormonal changes that your body experiences during menopause. These can affect how your body processes dietary fats, triglycerides, and  cholesterol. Heart attack and stroke are both medical emergencies. There are many things that you can do to help prevent heart disease and stroke:  Have your blood pressure checked at least every 1-2 years. High blood pressure causes heart disease and increases the risk of stroke.  If you are 55-79 years old, ask your health care provider if you should take aspirin to prevent a heart attack or a stroke.  Do not use any tobacco products, including cigarettes, chewing tobacco, or electronic cigarettes. If you need help quitting, ask your health care provider.  It is important to eat a healthy diet and maintain a healthy weight.  Be sure to include plenty of vegetables, fruits, low-fat dairy products, and lean protein.  Avoid eating foods that are high in solid fats, added sugars, or salt (sodium).  Get regular exercise. This is one of the most important things that you can do for your health.  Try to exercise for at least 150 minutes each week. The type of exercise that you do should increase your heart rate and make you sweat. This is known as moderate-intensity exercise.  Try to do strengthening exercises at least twice each week. Do these in addition to the moderate-intensity exercise.  Know your numbers.Ask your health care provider to check your cholesterol and your blood glucose. Continue to have your blood tested as directed by your health care provider. WHAT SHOULD I KNOW ABOUT CANCER SCREENING? There are several types of cancer. Take the following steps to reduce your risk and to catch any cancer development as early as possible. Breast Cancer  Practice breast self-awareness.  This means understanding how your breasts   normally appear and feel.  It also means doing regular breast self-exams. Let your health care provider know about any changes, no matter how small.  If you are 40 or older, have a clinician do a breast exam (clinical breast exam or CBE) every year. Depending on  your age, family history, and medical history, it may be recommended that you also have a yearly breast X-ray (mammogram).  If you have a family history of breast cancer, talk with your health care provider about genetic screening.  If you are at high risk for breast cancer, talk with your health care provider about having an MRI and a mammogram every year.  Breast cancer (BRCA) gene test is recommended for women who have family members with BRCA-related cancers. Results of the assessment will determine the need for genetic counseling and BRCA1 and for BRCA2 testing. BRCA-related cancers include these types:  Breast. This occurs in males or females.  Ovarian.  Tubal. This may also be called fallopian tube cancer.  Cancer of the abdominal or pelvic lining (peritoneal cancer).  Prostate.  Pancreatic. Cervical, Uterine, and Ovarian Cancer Your health care provider may recommend that you be screened regularly for cancer of the pelvic organs. These include your ovaries, uterus, and vagina. This screening involves a pelvic exam, which includes checking for microscopic changes to the surface of your cervix (Pap test).  For women ages 21-65, health care providers may recommend a pelvic exam and a Pap test every three years. For women ages 30-65, they may recommend the Pap test and pelvic exam, combined with testing for human papilloma virus (HPV), every five years. Some types of HPV increase your risk of cervical cancer. Testing for HPV may also be done on women of any age who have unclear Pap test results.  Other health care providers may not recommend any screening for nonpregnant women who are considered low risk for pelvic cancer and have no symptoms. Ask your health care provider if a screening pelvic exam is right for you.  If you have had past treatment for cervical cancer or a condition that could lead to cancer, you need Pap tests and screening for cancer for at least 20 years after your  treatment. If Pap tests have been discontinued for you, your risk factors (such as having a new sexual partner) need to be reassessed to determine if you should start having screenings again. Some women have medical problems that increase the chance of getting cervical cancer. In these cases, your health care provider may recommend that you have screening and Pap tests more often.  If you have a family history of uterine cancer or ovarian cancer, talk with your health care provider about genetic screening.  If you have vaginal bleeding after reaching menopause, tell your health care provider.  There are currently no reliable tests available to screen for ovarian cancer. Lung Cancer Lung cancer screening is recommended for adults 55-80 years old who are at high risk for lung cancer because of a history of smoking. A yearly low-dose CT scan of the lungs is recommended if you:  Currently smoke.  Have a history of at least 30 pack-years of smoking and you currently smoke or have quit within the past 15 years. A pack-year is smoking an average of one pack of cigarettes per day for one year. Yearly screening should:  Continue until it has been 15 years since you quit.  Stop if you develop a health problem that would prevent you from having   lung cancer treatment. Colorectal Cancer  This type of cancer can be detected and can often be prevented.  Routine colorectal cancer screening usually begins at age 50 and continues through age 75.  If you have risk factors for colon cancer, your health care provider may recommend that you be screened at an earlier age.  If you have a family history of colorectal cancer, talk with your health care provider about genetic screening.  Your health care provider may also recommend using home test kits to check for hidden blood in your stool.  A small camera at the end of a tube can be used to examine your colon directly (sigmoidoscopy or colonoscopy). This is  done to check for the earliest forms of colorectal cancer.  Direct examination of the colon should be repeated every 5-10 years until age 75. However, if early forms of precancerous polyps or small growths are found or if you have a family history or genetic risk for colorectal cancer, you may need to be screened more often. Skin Cancer  Check your skin from head to toe regularly.  Monitor any moles. Be sure to tell your health care provider:  About any new moles or changes in moles, especially if there is a change in a mole's shape or color.  If you have a mole that is larger than the size of a pencil eraser.  If any of your family members has a history of skin cancer, especially at a young age, talk with your health care provider about genetic screening.  Always use sunscreen. Apply sunscreen liberally and repeatedly throughout the day.  Whenever you are outside, protect yourself by wearing long sleeves, pants, a wide-brimmed hat, and sunglasses. WHAT SHOULD I KNOW ABOUT OSTEOPOROSIS? Osteoporosis is a condition in which bone destruction happens more quickly than new bone creation. After menopause, you may be at an increased risk for osteoporosis. To help prevent osteoporosis or the bone fractures that can happen because of osteoporosis, the following is recommended:  If you are 19-50 years old, get at least 1,000 mg of calcium and at least 600 mg of vitamin D per day.  If you are older than age 50 but younger than age 70, get at least 1,200 mg of calcium and at least 600 mg of vitamin D per day.  If you are older than age 70, get at least 1,200 mg of calcium and at least 800 mg of vitamin D per day. Smoking and excessive alcohol intake increase the risk of osteoporosis. Eat foods that are rich in calcium and vitamin D, and do weight-bearing exercises several times each week as directed by your health care provider. WHAT SHOULD I KNOW ABOUT HOW MENOPAUSE AFFECTS MY MENTAL  HEALTH? Depression may occur at any age, but it is more common as you become older. Common symptoms of depression include:  Low or sad mood.  Changes in sleep patterns.  Changes in appetite or eating patterns.  Feeling an overall lack of motivation or enjoyment of activities that you previously enjoyed.  Frequent crying spells. Talk with your health care provider if you think that you are experiencing depression. WHAT SHOULD I KNOW ABOUT IMMUNIZATIONS? It is important that you get and maintain your immunizations. These include:  Tetanus, diphtheria, and pertussis (Tdap) booster vaccine.  Influenza every year before the flu season begins.  Pneumonia vaccine.  Shingles vaccine. Your health care provider may also recommend other immunizations.   This information is not intended to replace advice given   to you by your health care provider. Make sure you discuss any questions you have with your health care provider.   Document Released: 12/29/2005 Document Revised: 11/27/2014 Document Reviewed: 07/09/2014 Elsevier Interactive Patient Education [employer].

## 2016-09-27 NOTE — Progress Notes (Signed)
Pre-visit discussion using our clinic review tool. No additional management support is needed unless otherwise documented below in the visit note.  

## 2016-09-27 NOTE — Progress Notes (Signed)
Patient ID: Erica Maldonado, female    DOB: [birth year]  Age: 65 y.o. MRN: [id number]  The patient is here for her Welcome to Medicare  wellness examination and management of other chronic and acute problems.   Health Maintenance /screenings reviewed:  PAP smear is due  Annual mammogram was done yesterday Colonoscopy  Was done in 2016 2016 DEXA scan results discussed   The risk factors are reflected in the social history.  The roster of all physicians providing medical care to patient - is listed in the Snapshot section of the chart.  Activities of daily living:  The patient is 100% independent in all ADLs: dressing, toileting, feeding as well as independent mobility   Home safety : The patient has smoke detectors in the home. They wear seatbelts.  There are secured firearms at home. Her husband is a retired Economist. There is no violence in the home.   There is no risks for hepatitis, STDs or HIV. There is no   history of blood transfusion. They have no travel history to infectious disease endemic areas of the world.  The patient has seen their dentist in the last six month. They have seen their eye doctor in the last year. They have no  hearing difficulty with regard to whispered voices television programs.  They have deferred audiologic testing in the last year.    They do not  have excessive sun exposure. Discussed the need for sun protection: hats, long sleeves and use of sunscreen if there is significant sun exposure.   Diet: the importance of a healthy diet is discussed. They do have a healthy diet.  The benefits of regular aerobic exercise were discussed. She exercises 5 times per week ,  20 minutes.   Depression screen: there are no signs or vegative symptoms of depression- irritability, change in appetite, anhedonia, sadness/tearfullness.  Cognitive assessment: the patient manages all their financial and personal affairs and is actively engaged. They could relate  day,date,year and events; recalled 2/3 objects at 3 minutes; performed clock-face test normally.  The following portions of the patient's history were reviewed and updated as appropriate: allergies, current medications, past family history, past medical history,  past surgical history, past social history  and problem list.  Visual acuity was not assessed per patient preference since she has regular follow up with her ophthalmologist. Hearing and body mass index were assessed and reviewed.   During the course of the visit the patient was educated and counseled about appropriate screening and preventive services including : fall prevention , diabetes screening, nutrition counseling, colorectal cancer screening, and recommended immunizations.    .During the course of the visit , End of Life objectives were discussed at length,  Patient has  a living will in place and a healthcare power of attorney. Her daughter Erica Maldonado has been appointed her healthcare POA.   She was given printed information about advance directives and encouraged to return after discussing with her family,   CC: The primary encounter diagnosis was Cervical cancer screening. Diagnoses of Osteopenia of spine and Encounter for preventive health examination were also pertinent to this visit.  No issues except chronic constipation.   Did not tolerate amitiza and linzess trials . Using prn dulcolax, daily prune juice.  System was thrown off by recent travel'  History Erica Maldonado has a past medical history of Abdominal or pelvic swelling, mass, or lump, left lower quadrant; Epistaxis; Fibroadenoma; Hemorrhoids, external, without mention of complication; Infectious diarrhea(009.2); Other bursitis disorders; Other  chest pain; Scleroderma ([place]); Sleep disturbance, unspecified; and Umbilical hernia without mention of obstruction or gangrene.   She has a past surgical history that includes Hysteroscopy (1995); Breast biopsy (Right, 1988); Breast  biopsy (Right, 1995); Dilation and curettage of uterus (2001); and Colonoscopy (2001, 2011).   Her family history includes Breast cancer in her mother; Cancer in her brother and mother.She reports that she has never smoked. She has never used smokeless tobacco. She reports that she drinks alcohol. She reports that she does not use drugs.  Outpatient Medications Prior to Visit  Medication Sig Dispense Refill  . calcium-vitamin D 250-100 MG-UNIT per tablet Take 1 tablet by mouth 2 (two) times daily.    . cholecalciferol (VITAMIN D) 1000 UNITS tablet Take 2,000 Units by mouth daily.    . fish oil-omega-3 fatty acids 1000 MG capsule Take 1,200 g by mouth 2 (two) times daily.     Marland Kitchen docusate sodium (COLACE) 100 MG capsule Take 100 mg by mouth 2 (two) times daily.     No facility-administered medications prior to visit.     Review of Systems   Patient denies headache, fevers, malaise, unintentional weight loss, skin rash, eye pain, sinus congestion and sinus pain, sore throat, dysphagia,  hemoptysis , cough, dyspnea, wheezing, chest pain, palpitations, orthopnea, edema, abdominal pain, nausea, melena, diarrhea, flank pain, dysuria, hematuria, urinary  Frequency, nocturia, numbness, tingling, seizures,  Focal weakness, Loss of consciousness,  Tremor, insomnia, depression, anxiety, and suicidal ideation.      Objective:  BP 136/72   Pulse 68   Temp 97.8 F (36.6 C) (Oral)   Resp 12   Ht 5\' 2"  (1.575 m)   Wt 119 lb 4 oz (54.1 kg)   SpO2 99%   BMI 21.81 kg/m   Physical Exam   General Appearance:    Alert, cooperative, no distress, appears stated age  Head:    Normocephalic, without obvious abnormality, atraumatic  Eyes:    PERRL, conjunctiva/corneas clear, EOM's intact, fundi    benign, both eyes  Ears:    Normal TM's and external ear canals, both ears  Nose:   Nares normal, septum midline, mucosa normal, no drainage    or sinus tenderness  Throat:   Lips, mucosa, and tongue normal;  teeth and gums normal  Neck:   Supple, symmetrical, trachea midline, no adenopathy;    thyroid:  no enlargement/tenderness/nodules; no carotid   bruit or JVD  Back:     Symmetric, no curvature, ROM normal, no CVA tenderness  Lungs:     Clear to auscultation bilaterally, respirations unlabored  Chest Wall:    No tenderness or deformity   Heart:    Regular rate and rhythm, S1 and S2 normal, no murmur, rub   or gallop  Breast Exam:    No tenderness, masses, or nipple abnormality  Abdomen:     Soft, non-tender, bowel sounds active all four quadrants,    no masses, no organomegaly  Genitalia:    Pelvic: cervix normal in appearance, external genitalia normal, no adnexal masses or tenderness, no cervical motion tenderness, rectovaginal septum normal, uterus normal size, shape, and consistency and vagina normal without discharge  Extremities:   Extremities normal, atraumatic, no cyanosis or edema  Pulses:   2+ and symmetric all extremities  Skin:   Skin color, texture, turgor normal, no rashes or lesions  Lymph nodes:   Cervical, supraclavicular, and axillary nodes normal  Neurologic:   CNII-XII intact, normal strength, sensation and reflexes  throughout    Assessment & Plan:   Problem List Items Addressed This Visit    Welcome to Medicare preventive visit    Annual comprehensive preventive exam was done as well as an evaluation and management of chronic conditions .  During the course of the visit the patient 's  appropriate screeninings were brought up to date  and preventive services including :  diabetes screening, lipid analysis with projected  10 year  risk for CAD , nutrition counseling, breast, cervical and colorectal cancer screening, and recommended immunizations.  Printed recommendations for health maintenance screenings was given.  Lab Results  Component Value Date   CHOL 207 (H) 01/12/2016   HDL 74.90 01/12/2016   LDLCALC 117 (H) 01/12/2016   TRIG 77.0 01/12/2016   CHOLHDL 3  01/12/2016        Osteopenia    She is not interested in pharmacotherapy at this time. Discussed the current controversies surrounding the risks and benefits of calcium supplementation.  Encouraged her to increase dietary calcium through natural foods including almond/coconut milk       Other Visit Diagnoses    Cervical cancer screening    -  Primary   Relevant Orders   Cytology - PAP (Completed)      I am having Ms. Waldren start on estradiol. I am also having her maintain her fish oil-omega-3 fatty acids, cholecalciferol, calcium-vitamin D, and docusate sodium.  Meds ordered this encounter  Medications  . estradiol (ESTRACE VAGINAL) 0.1 MG/GM vaginal cream    Sig: Place 1 Applicatorful vaginally 3 (three) times a week.    Dispense:  42.5 g    Refill:  12    There are no discontinued medications.  Follow-up: No Follow-up on file.   Crecencio Mc, MD

## 2016-09-28 ENCOUNTER — Encounter: Payer: Self-pay | Admitting: Internal Medicine

## 2016-09-28 LAB — CYTOLOGY - PAP
ADEQUACY: ABSENT
DIAGNOSIS: NEGATIVE
HPV: NOT DETECTED

## 2016-09-29 ENCOUNTER — Encounter: Payer: Self-pay | Admitting: Internal Medicine

## 2016-09-30 NOTE — Assessment & Plan Note (Signed)
She is not interested in pharmacotherapy at this time. Discussed the current controversies surrounding the risks and benefits of calcium supplementation.  Encouraged her to increase dietary calcium through natural foods including almond/coconut milk 

## 2016-09-30 NOTE — Assessment & Plan Note (Signed)
Annual comprehensive preventive exam was done as well as an evaluation and management of chronic conditions .  During the course of the visit the patient 's  appropriate screeninings were brought up to date  and preventive services including :  diabetes screening, lipid analysis with projected  10 year  risk for CAD , nutrition counseling, breast, cervical and colorectal cancer screening, and recommended immunizations.  Printed recommendations for health maintenance screenings was given.  Lab Results  Component Value Date   CHOL 207 (H) 01/12/2016   HDL 74.90 01/12/2016   LDLCALC 117 (H) 01/12/2016   TRIG 77.0 01/12/2016   CHOLHDL 3 01/12/2016

## 2017-02-23 DIAGNOSIS — D225 Melanocytic nevi of trunk: Secondary | ICD-10-CM | POA: Diagnosis not present

## 2017-02-23 DIAGNOSIS — D2262 Melanocytic nevi of left upper limb, including shoulder: Secondary | ICD-10-CM | POA: Diagnosis not present

## 2017-02-23 DIAGNOSIS — L728 Other follicular cysts of the skin and subcutaneous tissue: Secondary | ICD-10-CM | POA: Diagnosis not present

## 2017-02-23 DIAGNOSIS — D2261 Melanocytic nevi of right upper limb, including shoulder: Secondary | ICD-10-CM | POA: Diagnosis not present

## 2017-08-16 NOTE — Telephone Encounter (Signed)
error 

## 2017-08-26 ENCOUNTER — Encounter: Payer: Self-pay | Admitting: Internal Medicine

## 2017-08-26 DIAGNOSIS — Z1239 Encounter for other screening for malignant neoplasm of breast: Secondary | ICD-10-CM

## 2017-09-28 ENCOUNTER — Encounter: Payer: Self-pay | Admitting: Internal Medicine

## 2017-09-28 ENCOUNTER — Ambulatory Visit (INDEPENDENT_AMBULATORY_CARE_PROVIDER_SITE_OTHER): Payer: Medicare Other | Admitting: Internal Medicine

## 2017-09-28 ENCOUNTER — Other Ambulatory Visit: Payer: Self-pay | Admitting: Internal Medicine

## 2017-09-28 ENCOUNTER — Other Ambulatory Visit (HOSPITAL_COMMUNITY)
Admission: RE | Admit: 2017-09-28 | Discharge: 2017-09-28 | Disposition: A | Discharge status: Home / Self Care | Payer: Medicare Other | Source: Ambulatory Visit | Attending: Internal Medicine | Admitting: Internal Medicine

## 2017-09-28 ENCOUNTER — Ambulatory Visit
Admission: RE | Admit: 2017-09-28 | Discharge: 2017-09-28 | Disposition: A | Discharge status: Home / Self Care | Payer: Medicare Other | Source: Ambulatory Visit | Attending: Internal Medicine | Admitting: Internal Medicine

## 2017-09-28 VITALS — BP 118/78 | HR 64 | Temp 97.2°F | Resp 16 | Ht 62.0 in | Wt 113.8 lb

## 2017-09-28 DIAGNOSIS — R928 Other abnormal and inconclusive findings on diagnostic imaging of breast: Secondary | ICD-10-CM | POA: Diagnosis not present

## 2017-09-28 DIAGNOSIS — R634 Abnormal weight loss: Secondary | ICD-10-CM

## 2017-09-28 DIAGNOSIS — Z1231 Encounter for screening mammogram for malignant neoplasm of breast: Secondary | ICD-10-CM | POA: Diagnosis not present

## 2017-09-28 DIAGNOSIS — Z1211 Encounter for screening for malignant neoplasm of colon: Secondary | ICD-10-CM

## 2017-09-28 DIAGNOSIS — Z124 Encounter for screening for malignant neoplasm of cervix: Secondary | ICD-10-CM | POA: Diagnosis not present

## 2017-09-28 DIAGNOSIS — E782 Mixed hyperlipidemia: Secondary | ICD-10-CM

## 2017-09-28 DIAGNOSIS — Z1239 Encounter for other screening for malignant neoplasm of breast: Secondary | ICD-10-CM

## 2017-09-28 DIAGNOSIS — N6310 Unspecified lump in the right breast, unspecified quadrant: Secondary | ICD-10-CM

## 2017-09-28 DIAGNOSIS — E559 Vitamin D deficiency, unspecified: Secondary | ICD-10-CM

## 2017-09-28 IMAGING — MG 2D DIGITAL SCREENING BILATERAL MAMMOGRAM WITH CAD AND ADJUNCT TO
8 of 14 series · 8 of 30 positions shown · non-contrast
Comparison: Previous exam(s).

CLINICAL DATA: Screening.

EXAM:
2D DIGITAL SCREENING BILATERAL MAMMOGRAM WITH CAD AND ADJUNCT TOMO

[R CC]
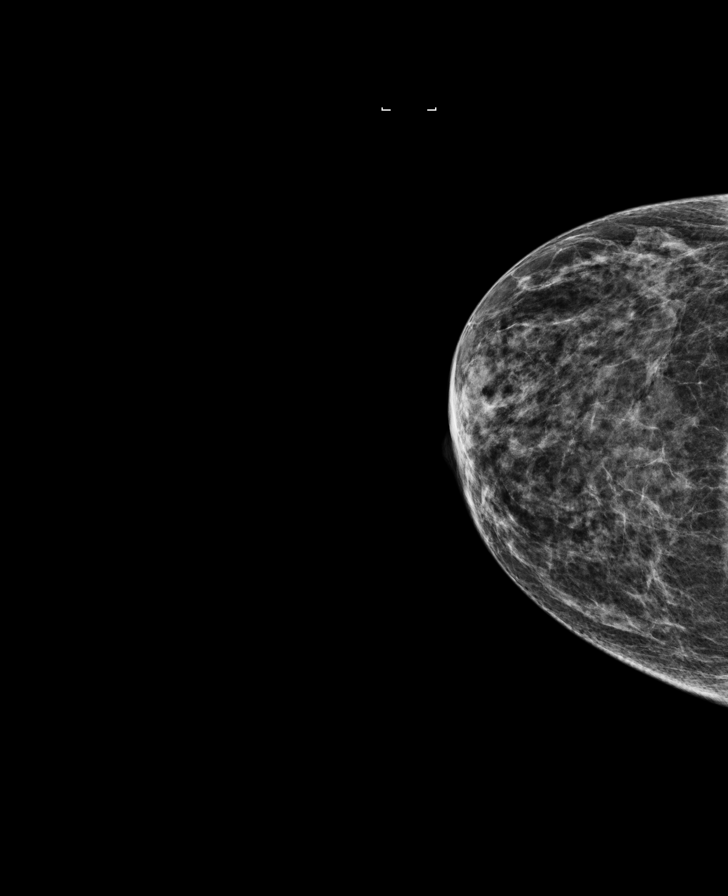

[L CC]
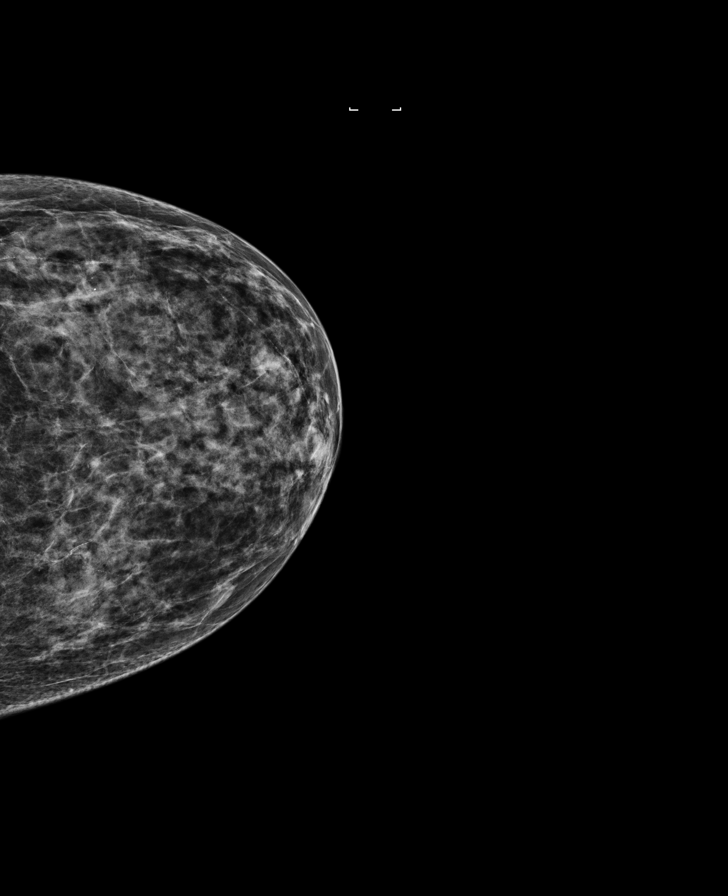

[R MLO]
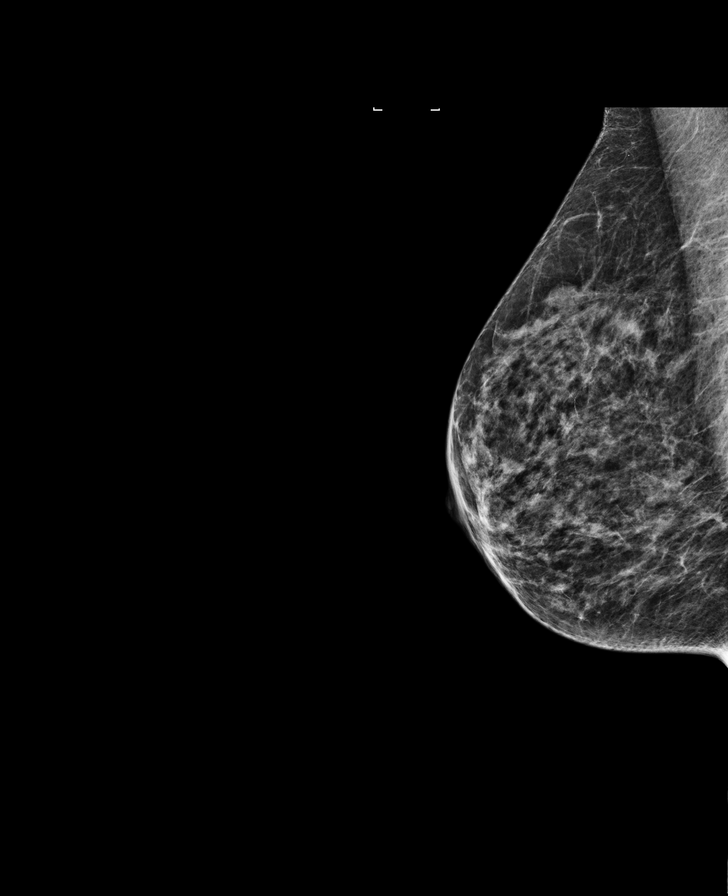

[R CC synth-2D]
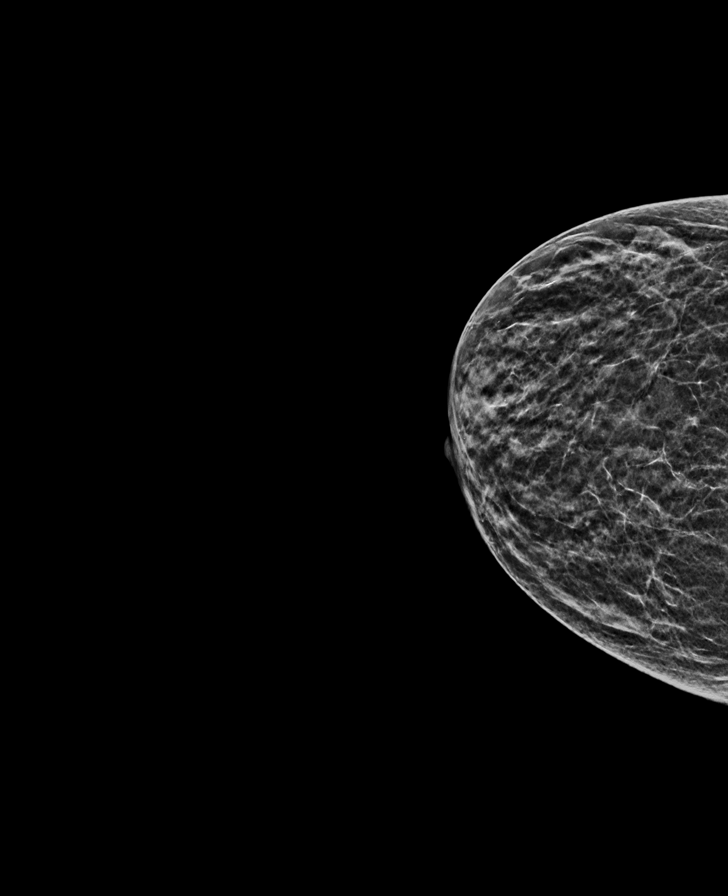

[R MLO synth-2D]
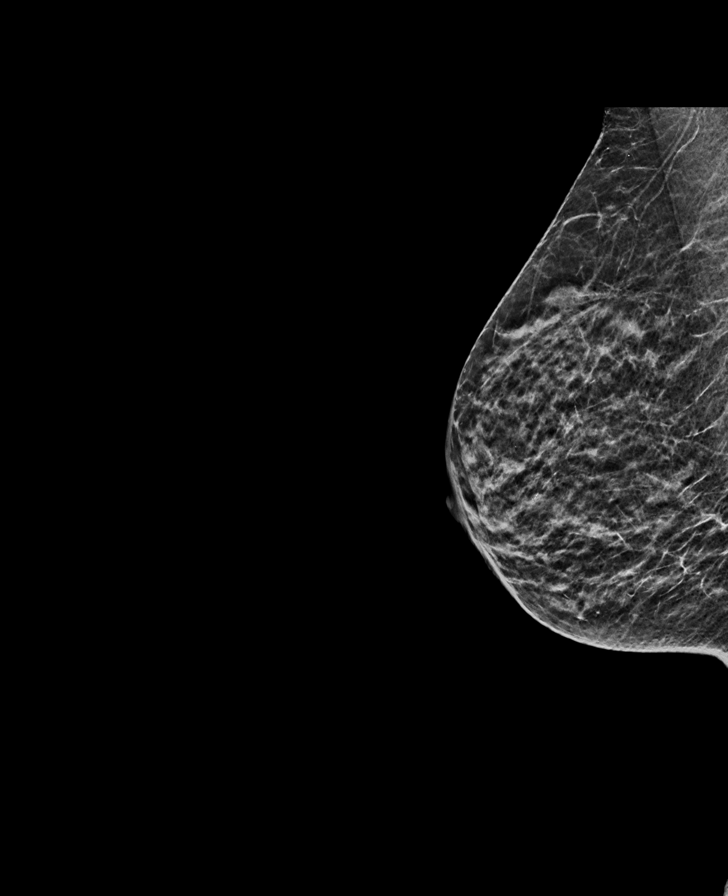

[L MLO]
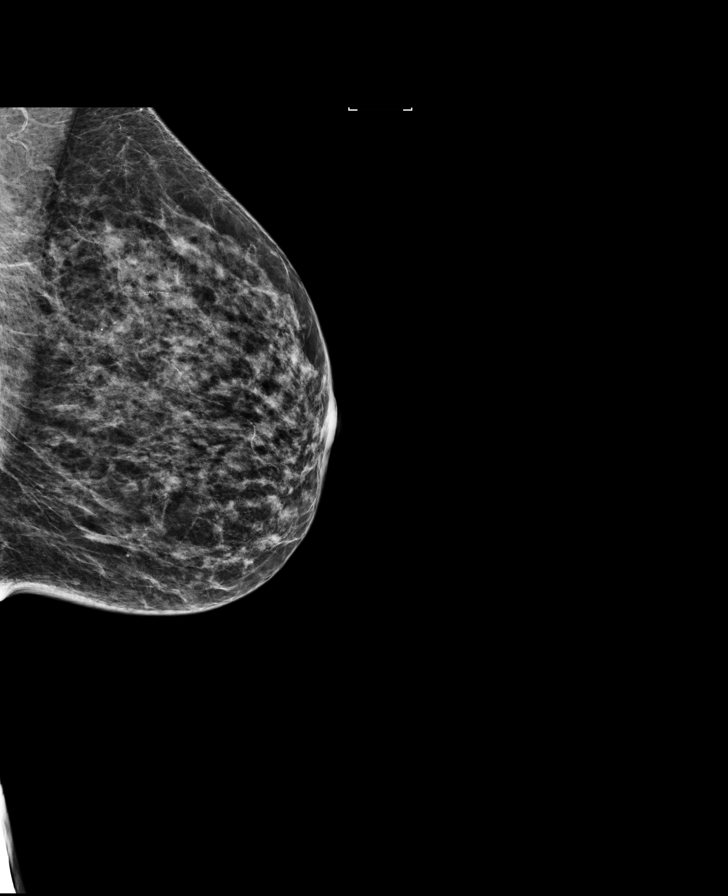

[L CC synth-2D]
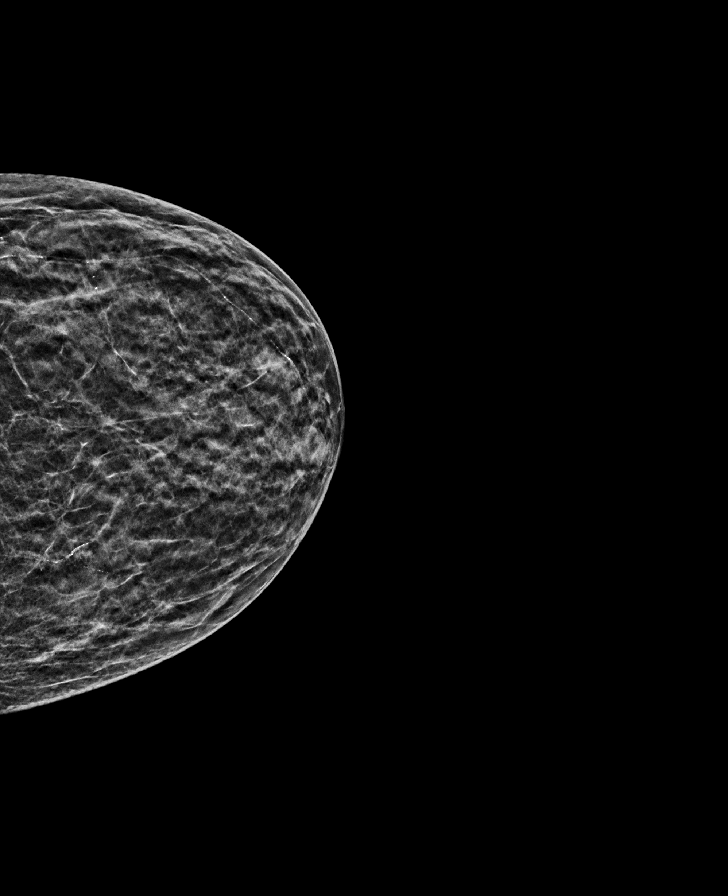

[L MLO synth-2D]
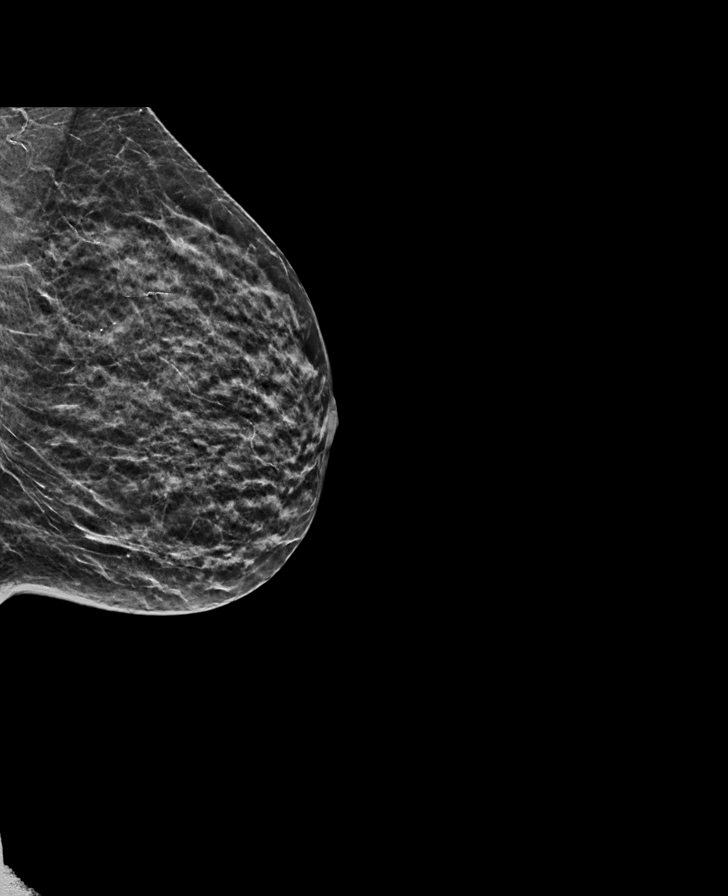

[8 of 30 positions shown; findings below may reference images not displayed]

ACR Breast Density Category c: The breast tissue is heterogeneously
dense, which may obscure small masses.
FINDINGS: In the right breast, a possible mass warrants further evaluation. In
the left breast, no findings suspicious for malignancy. Images were
processed with CAD.
IMPRESSION: Further evaluation is suggested for possible mass in the right
breast.

RECOMMENDATION:
Diagnostic mammogram and possibly ultrasound of the right breast.
(Code:[DQ])

The patient will be contacted regarding the findings, and additional
imaging will be scheduled.

BI-RADS CATEGORY  0: Incomplete. Need additional imaging evaluation
and/or prior mammograms for comparison.

## 2017-09-28 MED ORDER — ZOSTER VAC RECOMB ADJUVANTED 50 MCG/0.5ML IM SUSR: 0.5000 mL | Freq: Once | INTRAMUSCULAR | 1 refills | Status: AC

## 2017-09-28 MED ORDER — TETANUS-DIPHTH-ACELL PERTUSSIS 5-2.5-18.5 LF-MCG/0.5 IM SUSP: 0.5000 mL | Freq: Once | INTRAMUSCULAR | 0 refills | Status: AC

## 2017-09-28 NOTE — Progress Notes (Signed)
Patient ID: Erica Maldonado, female    DOB: Nov 08, 1951  Age: 66 y.o. MRN: [id number]  The patient is here for annual examination and management of other chronic and acute problems.  Pap smear due Mammogram done today  dexa 2016  DEFERRED TREATMENT    The risk factors are reflected in the social history.  The roster of all physicians providing medical care to patient - is listed in the Snapshot section of the chart.  Activities of daily living:  The patient is 100% independent in all ADLs: dressing, toileting, feeding as well as independent mobility  Home safety : The patient has smoke detectors in the home. They wear seatbelts.  There are no firearms at home. There is no violence in the home.   There is no risks for hepatitis, STDs or HIV. There is no   history of blood transfusion. They have no travel history to infectious disease endemic areas of the world.  The patient has seen their dentist in the last six month. They have seen their eye doctor in the last year. They deny hearing difficulty with regard to whispered voices and some television programs.  They have deferred audiologic testing in the last year.  They do not  have excessive sun exposure. Discussed the need for sun protection: hats, long sleeves and use of sunscreen if there is significant sun exposure.   Diet: the importance of a healthy diet is discussed. They do have a healthy diet.  'WHOLE 30" ELIMINATION DIET: NO DAIRY, ALCOHOL, SUGAR, LEGUMESOR GLUTEN ,    UNINTENTIONAL WEIGHT OSS OF 6 LBS.  Bloating has improved since she gave up cheese and legumes.   TAKING PREBIOTIC CAPSULES AND FIBER SUPPLEMENT  FROM  [facility] .  BOWELS MOVING MORE REGULARLY AND LESS HARD .  The benefits of regular aerobic exercise were discussed. She walks 4 times per week ,  20 minutes.   Depression screen: there are no signs or vegative symptoms of depression- irritability, change in appetite, anhedonia, sadness/tearfullness.  Cognitive  assessment: the patient manages all their financial and personal affairs and is actively engaged. They could relate day,date,year and events; recalled 2/3 objects at 3 minutes; performed clock-face test normally.  The following portions of the patient's history were reviewed and updated as appropriate: allergies, current medications, past family history, past medical history,  past surgical history, past social history  and problem list.  Visual acuity was not assessed per patient preference since she has regular follow up with her ophthalmologist. Hearing and body mass index were assessed and reviewed.   During the course of the visit the patient was educated and counseled about appropriate screening and preventive services including : fall prevention , diabetes screening, nutrition counseling, colorectal cancer screening, and recommended immunizations.    CC: The primary encounter diagnosis was Cervical cancer screening. Diagnoses of Weight loss, Mixed hyperlipidemia, Vitamin D deficiency, Screening for cervical cancer, Breast cancer screening, and Colon cancer screening were also pertinent to this visit.  History Erica Maldonado has a past medical history of Abdominal or pelvic swelling, mass, or lump, left lower quadrant, Epistaxis, Fibroadenoma, Hemorrhoids, external, without mention of complication, Infectious diarrhea(009.2), Other bursitis disorders, Other chest pain, Scleroderma ([place]), Sleep disturbance, unspecified, and Umbilical hernia without mention of obstruction or gangrene.   She has a past surgical history that includes Hysteroscopy (1995); Dilation and curettage of uterus (2001); Colonoscopy (2001, 2011); Breast biopsy (Right, 1988); and Breast biopsy (Right, 1995).   Her family history includes Breast cancer  in her mother; Cancer in her brother and mother.She reports that  has never smoked. she has never used smokeless tobacco. She reports that she drinks alcohol. She reports that she does  not use drugs.  Outpatient Medications Prior to Visit  Medication Sig Dispense Refill  . CALCIUM CITRATE PO Take 300 mg daily by mouth.    . cholecalciferol (VITAMIN D) 1000 UNITS tablet Take 2,000 Units by mouth daily.    Marland Kitchen estradiol (ESTRACE VAGINAL) 0.1 MG/GM vaginal cream Place 1 Applicatorful vaginally 3 (three) times a week. 42.5 g 12  . Magnesium Ascorbate POWD 125 mg daily by Does not apply route.    . calcium-vitamin D 250-100 MG-UNIT per tablet Take 1 tablet by mouth 2 (two) times daily.    Marland Kitchen docusate sodium (COLACE) 100 MG capsule Take 100 mg by mouth 2 (two) times daily.    . fish oil-omega-3 fatty acids 1000 MG capsule Take 1,200 g by mouth 2 (two) times daily.      No facility-administered medications prior to visit.     Review of Systems   Patient denies headache, fevers, malaise, unintentional weight loss, skin rash, eye pain, sinus congestion and sinus pain, sore throat, dysphagia,  hemoptysis , cough, dyspnea, wheezing, chest pain, palpitations, orthopnea, edema, abdominal pain, nausea, melena, diarrhea, constipation, flank pain, dysuria, hematuria, urinary  Frequency, nocturia, numbness, tingling, seizures,  Focal weakness, Loss of consciousness,  Tremor, insomnia, depression, anxiety, and suicidal ideation.      Objective:  BP 118/78 (BP Location: Left Arm, Patient Position: Sitting, Cuff Size: Normal)   Pulse 64   Temp (!) 97.2 F (36.2 C) (Oral)   Resp 16   Ht 5\' 2"  (1.575 m)   Wt 113 lb 12.8 oz (51.6 kg)   SpO2 98%   BMI 20.81 kg/m   Physical Exam  General Appearance:    Alert, cooperative, no distress, appears stated age  Head:    Normocephalic, without obvious abnormality, atraumatic  Eyes:    PERRL, conjunctiva/corneas clear, EOM's intact, fundi    benign, both eyes  Ears:    Normal TM's and external ear canals, both ears  Nose:   Nares normal, septum midline, mucosa normal, no drainage    or sinus tenderness  Throat:   Lips, mucosa, and tongue  normal; teeth and gums normal  Neck:   Supple, symmetrical, trachea midline, no adenopathy;    thyroid:  no enlargement/tenderness/nodules; no carotid   bruit or JVD  Back:     Symmetric, no curvature, ROM normal, no CVA tenderness  Lungs:     Clear to auscultation bilaterally, respirations unlabored  Chest Wall:    No tenderness or deformity   Heart:    Regular rate and rhythm, S1 and S2 normal, no murmur, rub   or gallop  Breast Exam:    No tenderness, masses, or nipple abnormality  Abdomen:     Soft, non-tender, bowel sounds active all four quadrants,    no masses, no organomegaly  Genitalia:    Pelvic: cervix normal in appearance, external genitalia normal, no adnexal masses or tenderness, no cervical motion tenderness, rectovaginal septum normal, uterus normal size, shape, and consistency and vagina normal without discharge  Extremities:   Extremities normal, atraumatic, no cyanosis or edema  Pulses:   2+ and symmetric all extremities  Skin:   Skin color, texture, turgor normal, no rashes or lesions  Lymph nodes:   Cervical, supraclavicular, and axillary nodes normal  Neurologic:   CNII-XII intact,  normal strength, sensation and reflexes    throughout    Assessment & Plan:   Problem List Items Addressed This Visit    Breast cancer screening    Breast cancer screening was done today.  Breast exam normal . Mammogram this morning noted an asymmetry  On the  Right and additional images are needed.       Colon cancer screening    Normal colonoscopy in 2013,  10 yr follow up advised.       Screening for cervical cancer    PAP smear was done today.      RESOLVED: Vitamin D deficiency   Relevant Orders   VITAMIN D 25 Hydroxy (Vit-D Deficiency, Fractures) (Completed)    Other Visit Diagnoses    Cervical cancer screening    -  Primary   Relevant Orders   Cytology - PAP   Weight loss       Relevant Orders   CBC with Differential/Platelet (Completed)   Comprehensive metabolic  panel (Completed)   TSH (Completed)   Mixed hyperlipidemia       Relevant Orders   Lipid panel (Completed)      I have discontinued Vaughan Basta Deruiter's fish oil-omega-3 fatty acids, calcium-vitamin D, and docusate sodium. I am also having her start on Tdap and Zoster Vaccine Adjuvanted. Additionally, I am having her maintain her cholecalciferol, estradiol, Magnesium Ascorbate, and CALCIUM CITRATE PO.  Meds ordered this encounter  Medications  . Magnesium Ascorbate POWD    Sig: 125 mg daily by Does not apply route.  Marland Kitchen CALCIUM CITRATE PO    Sig: Take 300 mg daily by mouth.  . Tdap (BOOSTRIX) 5-2.5-18.5 LF-MCG/0.5 injection    Sig: Inject 0.5 mLs once for 1 dose into the muscle.    Dispense:  0.5 mL    Refill:  0  . Zoster Vaccine Adjuvanted [facility]) injection    Sig: Inject 0.5 mLs once for 1 dose into the muscle.    Dispense:  1 each    Refill:  1    Medications Discontinued During This Encounter  Medication Reason  . calcium-vitamin D 250-100 MG-UNIT per tablet Patient has not taken in last 30 days  . docusate sodium (COLACE) 100 MG capsule Patient has not taken in last 30 days  . fish oil-omega-3 fatty acids 1000 MG capsule Patient has not taken in last 30 days    Follow-up: No Follow-up on file.   Crecencio Mc, MD

## 2017-09-28 NOTE — Patient Instructions (Addendum)
The ShingRx vaccine is now available in local pharmacies and is much more protective thant Zostavaxs,  It is therefore ADVISED for all interested adults over 75 to prevent shingles   Health Maintenance for Postmenopausal Women Menopause is a normal process in which your reproductive ability comes to an end. This process happens gradually over a span of months to years, usually between the ages of 42 and 39. Menopause is complete when you have missed 12 consecutive menstrual periods. It is important to talk with your health care provider about some of the most common conditions that affect postmenopausal women, such as heart disease, cancer, and bone loss (osteoporosis). Adopting a healthy lifestyle and getting preventive care can help to promote your health and wellness. Those actions can also lower your chances of developing some of these common conditions. What should I know about menopause? During menopause, you may experience a number of symptoms, such as:  Moderate-to-severe hot flashes.  Night sweats.  Decrease in sex drive.  Mood swings.  Headaches.  Tiredness.  Irritability.  Memory problems.  Insomnia.  Choosing to treat or not to treat menopausal changes is an individual decision that you make with your health care provider. What should I know about hormone replacement therapy and supplements? Hormone therapy products are effective for treating symptoms that are associated with menopause, such as hot flashes and night sweats. Hormone replacement carries certain risks, especially as you become older. If you are thinking about using estrogen or estrogen with progestin treatments, discuss the benefits and risks with your health care provider. What should I know about heart disease and stroke? Heart disease, heart attack, and stroke become more likely as you age. This may be due, in part, to the hormonal changes that your body experiences during menopause. These can affect how  your body processes dietary fats, triglycerides, and cholesterol. Heart attack and stroke are both medical emergencies. There are many things that you can do to help prevent heart disease and stroke:  Have your blood pressure checked at least every 1-2 years. High blood pressure causes heart disease and increases the risk of stroke.  If you are 93-31 years old, ask your health care provider if you should take aspirin to prevent a heart attack or a stroke.  Do not use any tobacco products, including cigarettes, chewing tobacco, or electronic cigarettes. If you need help quitting, ask your health care provider.  It is important to eat a healthy diet and maintain a healthy weight. ? Be sure to include plenty of vegetables, fruits, low-fat dairy products, and lean protein. ? Avoid eating foods that are high in solid fats, added sugars, or salt (sodium).  Get regular exercise. This is one of the most important things that you can do for your health. ? Try to exercise for at least 150 minutes each week. The type of exercise that you do should increase your heart rate and make you sweat. This is known as moderate-intensity exercise. ? Try to do strengthening exercises at least twice each week. Do these in addition to the moderate-intensity exercise.  Know your numbers.Ask your health care provider to check your cholesterol and your blood glucose. Continue to have your blood tested as directed by your health care provider.  What should I know about cancer screening? There are several types of cancer. Take the following steps to reduce your risk and to catch any cancer development as early as possible. Breast Cancer  Practice breast self-awareness. ? This means understanding how  your breasts normally appear and feel. ? It also means doing regular breast self-exams. Let your health care provider know about any changes, no matter how small.  If you are 41 or older, have a clinician do a breast exam  (clinical breast exam or CBE) every year. Depending on your age, family history, and medical history, it may be recommended that you also have a yearly breast X-ray (mammogram).  If you have a family history of breast cancer, talk with your health care provider about genetic screening.  If you are at high risk for breast cancer, talk with your health care provider about having an MRI and a mammogram every year.  Breast cancer (BRCA) gene test is recommended for women who have family members with BRCA-related cancers. Results of the assessment will determine the need for genetic counseling and BRCA1 and for BRCA2 testing. BRCA-related cancers include these types: ? Breast. This occurs in males or females. ? Ovarian. ? Tubal. This may also be called fallopian tube cancer. ? Cancer of the abdominal or pelvic lining (peritoneal cancer). ? Prostate. ? Pancreatic.  Cervical, Uterine, and Ovarian Cancer Your health care provider may recommend that you be screened regularly for cancer of the pelvic organs. These include your ovaries, uterus, and vagina. This screening involves a pelvic exam, which includes checking for microscopic changes to the surface of your cervix (Pap test).  For women ages 21-65, health care providers may recommend a pelvic exam and a Pap test every three years. For women ages 44-65, they may recommend the Pap test and pelvic exam, combined with testing for human papilloma virus (HPV), every five years. Some types of HPV increase your risk of cervical cancer. Testing for HPV may also be done on women of any age who have unclear Pap test results.  Other health care providers may not recommend any screening for nonpregnant women who are considered low risk for pelvic cancer and have no symptoms. Ask your health care provider if a screening pelvic exam is right for you.  If you have had past treatment for cervical cancer or a condition that could lead to cancer, you need Pap tests  and screening for cancer for at least 20 years after your treatment. If Pap tests have been discontinued for you, your risk factors (such as having a new sexual partner) need to be reassessed to determine if you should start having screenings again. Some women have medical problems that increase the chance of getting cervical cancer. In these cases, your health care provider may recommend that you have screening and Pap tests more often.  If you have a family history of uterine cancer or ovarian cancer, talk with your health care provider about genetic screening.  If you have vaginal bleeding after reaching menopause, tell your health care provider.  There are currently no reliable tests available to screen for ovarian cancer.  Lung Cancer Lung cancer screening is recommended for adults 1-38 years old who are at high risk for lung cancer because of a history of smoking. A yearly low-dose CT scan of the lungs is recommended if you:  Currently smoke.  Have a history of at least 30 pack-years of smoking and you currently smoke or have quit within the past 15 years. A pack-year is smoking an average of one pack of cigarettes per day for one year.  Yearly screening should:  Continue until it has been 15 years since you quit.  Stop if you develop a health problem that  would prevent you from having lung cancer treatment.  Colorectal Cancer  This type of cancer can be detected and can often be prevented.  Routine colorectal cancer screening usually begins at age 79 and continues through age 2.  If you have risk factors for colon cancer, your health care provider may recommend that you be screened at an earlier age.  If you have a family history of colorectal cancer, talk with your health care provider about genetic screening.  Your health care provider may also recommend using home test kits to check for hidden blood in your stool.  A small camera at the end of a tube can be used to  examine your colon directly (sigmoidoscopy or colonoscopy). This is done to check for the earliest forms of colorectal cancer.  Direct examination of the colon should be repeated every 5-10 years until age 61. However, if early forms of precancerous polyps or small growths are found or if you have a family history or genetic risk for colorectal cancer, you may need to be screened more often.  Skin Cancer  Check your skin from head to toe regularly.  Monitor any moles. Be sure to tell your health care provider: ? About any new moles or changes in moles, especially if there is a change in a mole's shape or color. ? If you have a mole that is larger than the size of a pencil eraser.  If any of your family members has a history of skin cancer, especially at a young age, talk with your health care provider about genetic screening.  Always use sunscreen. Apply sunscreen liberally and repeatedly throughout the day.  Whenever you are outside, protect yourself by wearing long sleeves, pants, a wide-brimmed hat, and sunglasses.  What should I know about osteoporosis? Osteoporosis is a condition in which bone destruction happens more quickly than new bone creation. After menopause, you may be at an increased risk for osteoporosis. To help prevent osteoporosis or the bone fractures that can happen because of osteoporosis, the following is recommended:  If you are 47-71 years old, get at least 1,000 mg of calcium and at least 600 mg of vitamin D per day.  If you are older than age 74 but younger than age 44, get at least 1,200 mg of calcium and at least 600 mg of vitamin D per day.  If you are older than age 53, get at least 1,200 mg of calcium and at least 800 mg of vitamin D per day.  Smoking and excessive alcohol intake increase the risk of osteoporosis. Eat foods that are rich in calcium and vitamin D, and do weight-bearing exercises several times each week as directed by your health care  provider. What should I know about how menopause affects my mental health? Depression may occur at any age, but it is more common as you become older. Common symptoms of depression include:  Low or sad mood.  Changes in sleep patterns.  Changes in appetite or eating patterns.  Feeling an overall lack of motivation or enjoyment of activities that you previously enjoyed.  Frequent crying spells.  Talk with your health care provider if you think that you are experiencing depression. What should I know about immunizations? It is important that you get and maintain your immunizations. These include:  Tetanus, diphtheria, and pertussis (Tdap) booster vaccine.  Influenza every year before the flu season begins.  Pneumonia vaccine.  Shingles vaccine.  Your health care provider may also recommend other immunizations.  This information is not intended to replace advice given to you by your health care provider. Make sure you discuss any questions you have with your health care provider. Document Released: 12/29/2005 Document Revised: 05/26/2016 Document Reviewed: 08/10/2015 Elsevier Interactive Patient Education  2018 [employer].

## 2017-09-29 LAB — CBC WITH DIFFERENTIAL/PLATELET
BASOS ABS: 31 {cells}/uL (ref 0–200)
Basophils Relative: 0.6 %
EOS ABS: 31 {cells}/uL (ref 15–500)
Eosinophils Relative: 0.6 %
HCT: 43.2 % (ref 35.0–45.0)
Hemoglobin: 14.4 g/dL (ref 11.7–15.5)
Lymphs Abs: 1301 cells/uL (ref 850–3900)
MCH: 29.4 pg (ref 27.0–33.0)
MCHC: 33.3 g/dL (ref 32.0–36.0)
MCV: 88.3 fL (ref 80.0–100.0)
MONOS PCT: 7.3 %
MPV: 11.4 fL (ref 7.5–12.5)
NEUTROS PCT: 66 %
Neutro Abs: 3366 cells/uL (ref 1500–7800)
PLATELETS: 232 10*3/uL (ref 140–400)
RBC: 4.89 10*6/uL (ref 3.80–5.10)
RDW: 12.7 % (ref 11.0–15.0)
TOTAL LYMPHOCYTE: 25.5 %
WBC mixed population: 372 cells/uL (ref 200–950)
WBC: 5.1 10*3/uL (ref 3.8–10.8)

## 2017-09-29 LAB — LIPID PANEL
Cholesterol: 217 mg/dL — ABNORMAL HIGH (ref <200)
HDL: 86 mg/dL (ref >50)
LDL CHOLESTEROL (CALC): 115 mg/dL — AB
Non-HDL Cholesterol (Calc): 131 mg/dL (calc) — ABNORMAL HIGH (ref <130)
TRIGLYCERIDES: 65 mg/dL (ref <150)
Total CHOL/HDL Ratio: 2.5 (calc) (ref <5.0)

## 2017-09-29 LAB — COMPREHENSIVE METABOLIC PANEL
AG Ratio: 1.9 (calc) (ref 1.0–2.5)
ALT: 13 U/L (ref 6–29)
AST: 20 U/L (ref 10–35)
Albumin: 4.6 g/dL (ref 3.6–5.1)
Alkaline phosphatase (APISO): 62 U/L (ref 33–130)
BILIRUBIN TOTAL: 0.5 mg/dL (ref 0.2–1.2)
BUN: 13 mg/dL (ref 7–25)
CALCIUM: 10 mg/dL (ref 8.6–10.4)
CO2: 30 mmol/L (ref 20–32)
Chloride: 100 mmol/L (ref 98–110)
Creat: 0.9 mg/dL (ref 0.50–0.99)
GLUCOSE: 88 mg/dL (ref 65–99)
Globulin: 2.4 g/dL (calc) (ref 1.9–3.7)
Potassium: 3.6 mmol/L (ref 3.5–5.3)
SODIUM: 140 mmol/L (ref 135–146)
TOTAL PROTEIN: 7 g/dL (ref 6.1–8.1)

## 2017-09-29 LAB — TSH: TSH: 2.66 m[IU]/L (ref 0.40–4.50)

## 2017-09-29 LAB — VITAMIN D 25 HYDROXY (VIT D DEFICIENCY, FRACTURES): VIT D 25 HYDROXY: 37 ng/mL (ref 30–100)

## 2017-09-30 DIAGNOSIS — Z1211 Encounter for screening for malignant neoplasm of colon: Secondary | ICD-10-CM | POA: Insufficient documentation

## 2017-09-30 DIAGNOSIS — Z124 Encounter for screening for malignant neoplasm of cervix: Secondary | ICD-10-CM | POA: Insufficient documentation

## 2017-09-30 NOTE — Assessment & Plan Note (Signed)
Normal colonoscopy in 2013,  10 yr follow up advised.

## 2017-09-30 NOTE — Assessment & Plan Note (Addendum)
Breast cancer screening was done today.  Breast exam normal . Mammogram this morning noted an asymmetry  On the  Right and additional images are needed.

## 2017-09-30 NOTE — Assessment & Plan Note (Signed)
PAP smear was done today 

## 2017-10-01 LAB — CYTOLOGY - PAP: DIAGNOSIS: NEGATIVE

## 2017-10-02 ENCOUNTER — Encounter: Payer: Self-pay | Admitting: Internal Medicine

## 2017-10-08 ENCOUNTER — Ambulatory Visit
Admission: RE | Admit: 2017-10-08 | Discharge: 2017-10-08 | Disposition: A | Discharge status: Home / Self Care | Payer: Medicare Other | Source: Ambulatory Visit | Attending: Internal Medicine | Admitting: Internal Medicine

## 2017-10-08 DIAGNOSIS — N6310 Unspecified lump in the right breast, unspecified quadrant: Secondary | ICD-10-CM

## 2017-10-08 DIAGNOSIS — R928 Other abnormal and inconclusive findings on diagnostic imaging of breast: Secondary | ICD-10-CM

## 2017-10-08 DIAGNOSIS — R922 Inconclusive mammogram: Secondary | ICD-10-CM | POA: Diagnosis not present

## 2017-10-08 DIAGNOSIS — D241 Benign neoplasm of right breast: Secondary | ICD-10-CM | POA: Insufficient documentation

## 2017-10-08 DIAGNOSIS — N6489 Other specified disorders of breast: Secondary | ICD-10-CM | POA: Diagnosis not present

## 2017-10-08 IMAGING — MG 2D DIGITAL DIAGNOSTIC UNILATERAL RIGHT MAMMOGRAM WITH CAD AND AD
6 of 9 series · 6 of 21 positions shown · non-contrast
Comparison: Previous exam(s).

CLINICAL DATA: Screening recall for a possible right breast mass.

EXAM:
2D DIGITAL DIAGNOSTIC UNILATERAL RIGHT MAMMOGRAM WITH CAD AND
ADJUNCT TOMO
RIGHT BREAST ULTRASOUND

[R XCCL synth-2D]
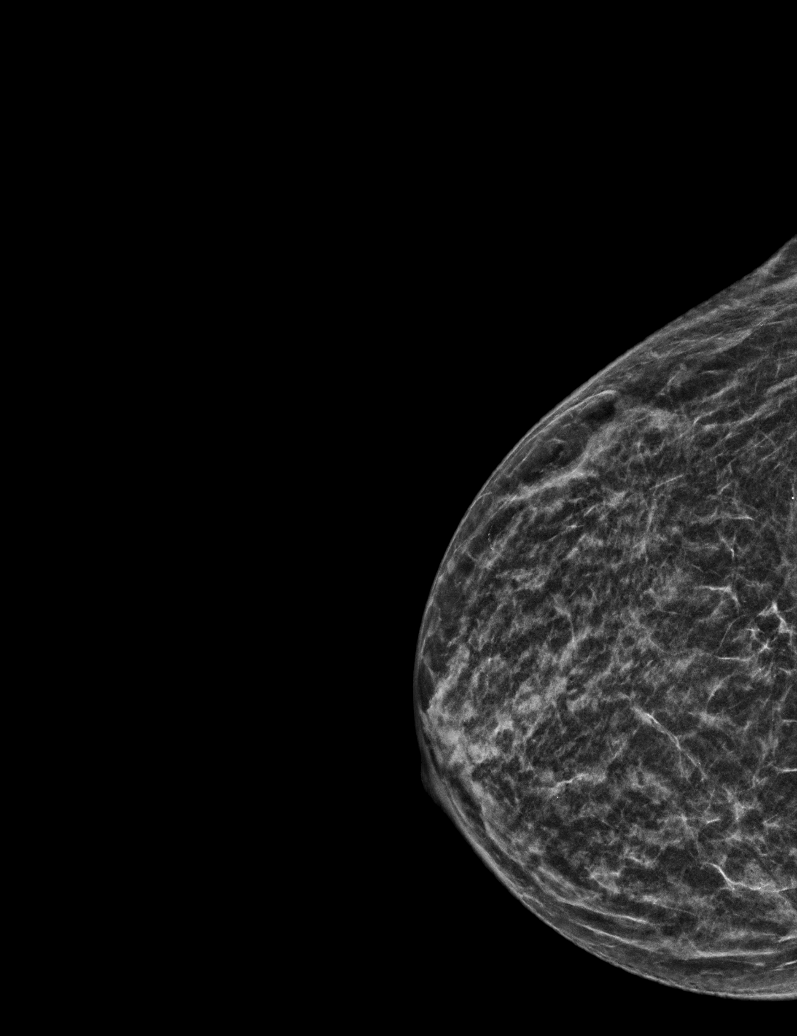

[R MLO synth-2D]
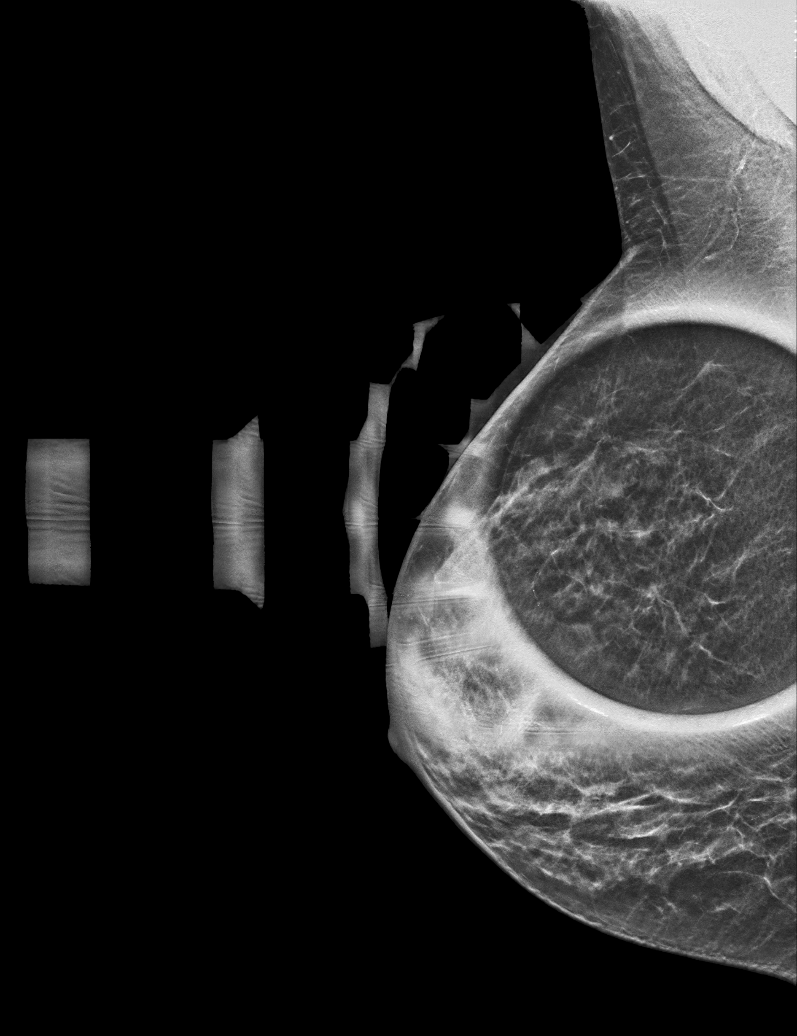

[R ML synth-2D]
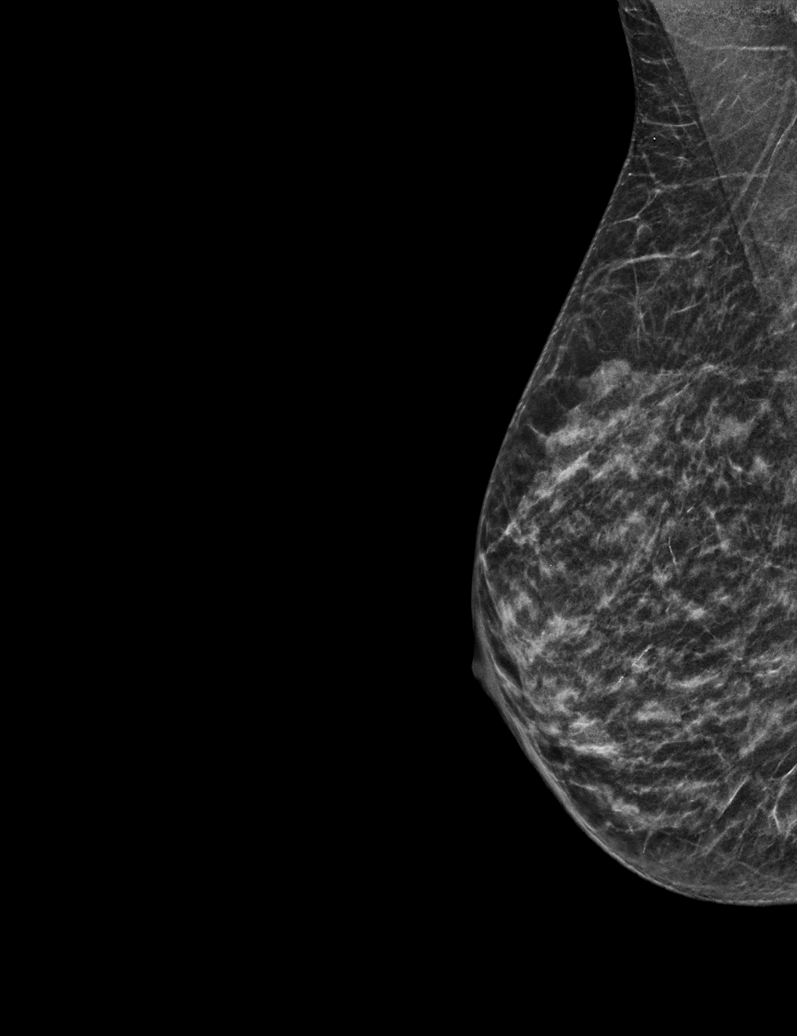

[R XCCL]
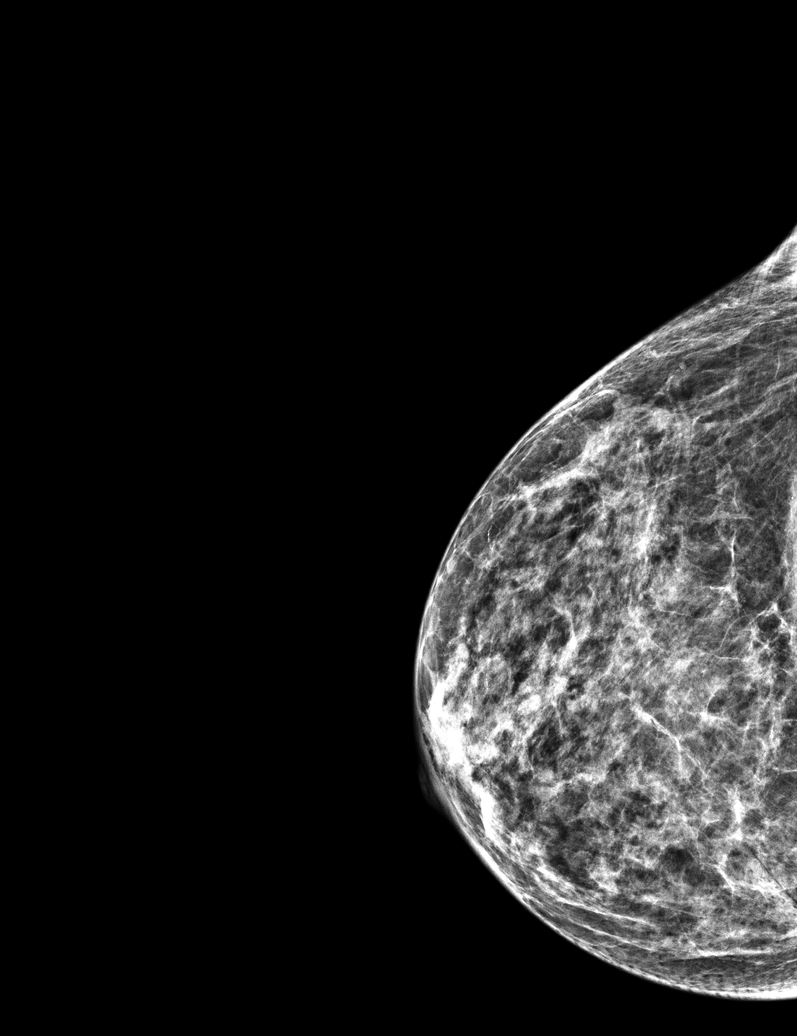

[R ML]
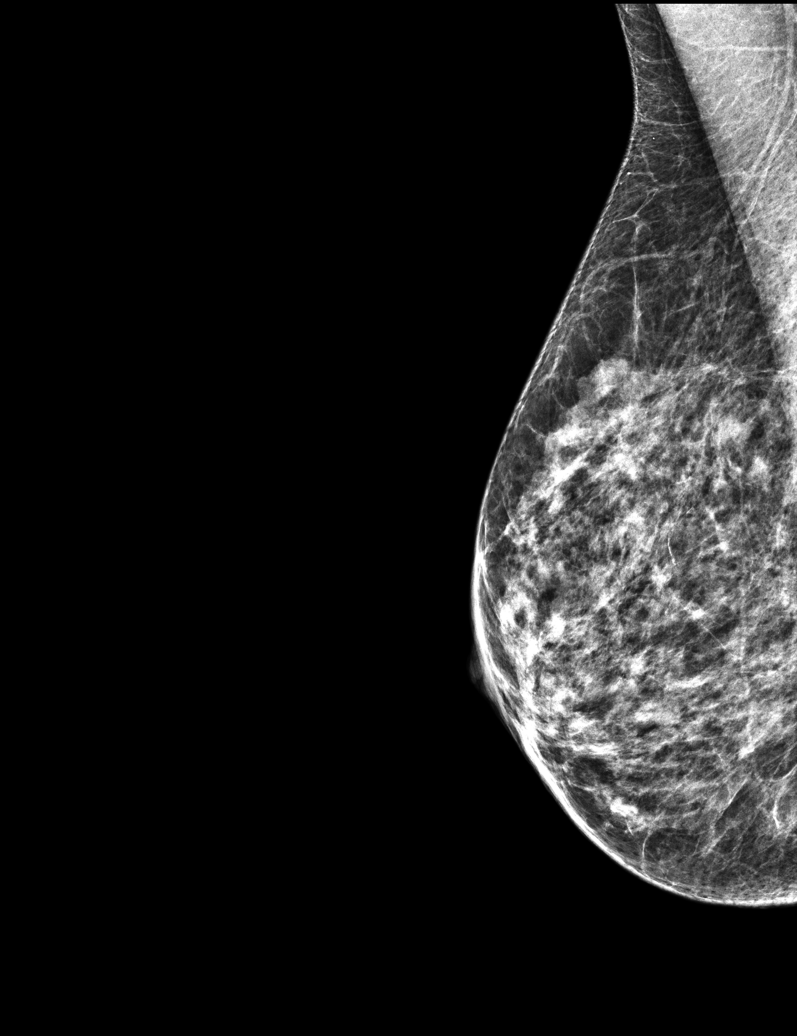

[R MLO]
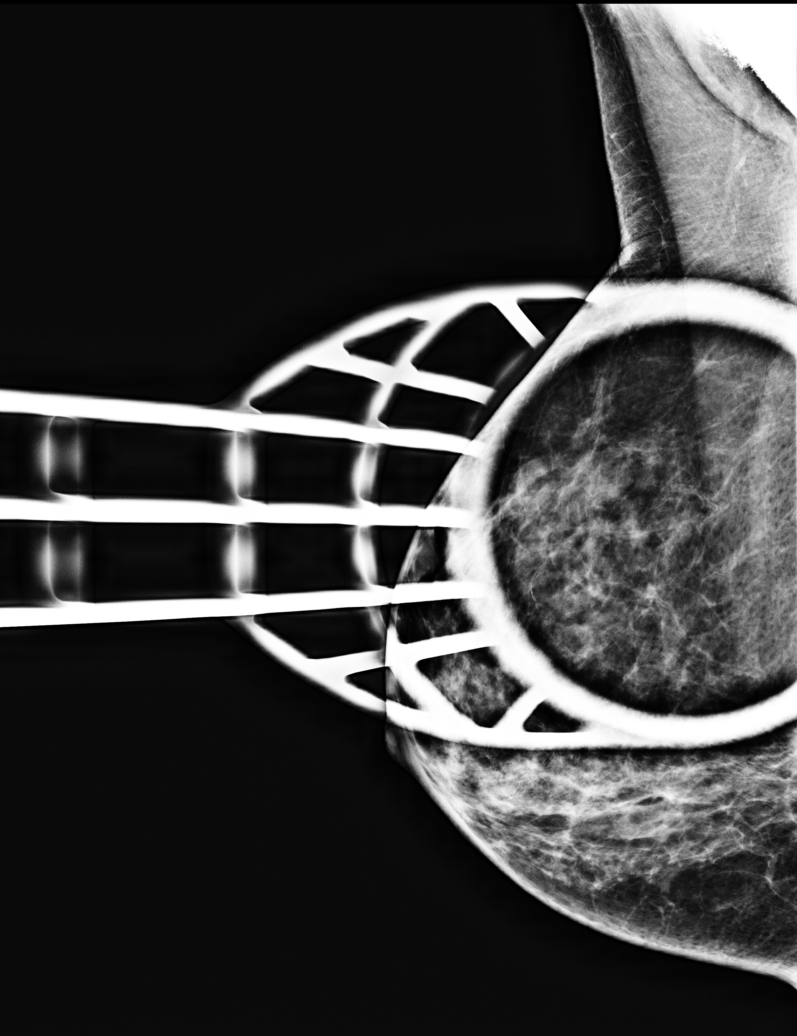

[6 of 21 positions shown; findings below may reference images not displayed]

ACR Breast Density Category c: The breast tissue is heterogeneously
dense, which may obscure small masses.
FINDINGS: On the MLO view, there is a persistent asymmetry in the superior
right breast, just anterior to the patient's surgical scar. There is
no corresponding mass on the CC view. On the MLO view, there does
appear to be fat within the mass suggesting that this is the
patient's normal fibroglandular tissue. Also, it has a similar
appearance to the patient's [YX] mammogram.

Mammographic images were processed with CAD.

Ultrasound targeted to the upper-outer quadrant of the right breast
demonstrates normal fibroglandular tissue. No masses or suspicious
areas of shadowing are identified.
IMPRESSION: The area of concern in the upper-outer quadrant of the patient's
right breast corresponds with normal benign breast tissue.

RECOMMENDATION:
Screening mammogram in one year.(Code:[YX])

I have discussed the findings and recommendations with the patient.
Results were also provided in writing at the conclusion of the
visit. If applicable, a reminder letter will be sent to the patient
regarding the next appointment.

BI-RADS CATEGORY  2: Benign.

## 2017-10-08 IMAGING — US US BREAST*R* LIMITED INC AXILLA
1 series · 2 of 2 positions shown · non-contrast
Comparison: Previous exam(s).

CLINICAL DATA: Screening recall for a possible right breast mass.

EXAM:
2D DIGITAL DIAGNOSTIC UNILATERAL RIGHT MAMMOGRAM WITH CAD AND
ADJUNCT TOMO
RIGHT BREAST ULTRASOUND

[Series 1: us breast*right* limited inc axilla · 0.04mm/px · 2 of 2 slices shown]
[im 1/2]
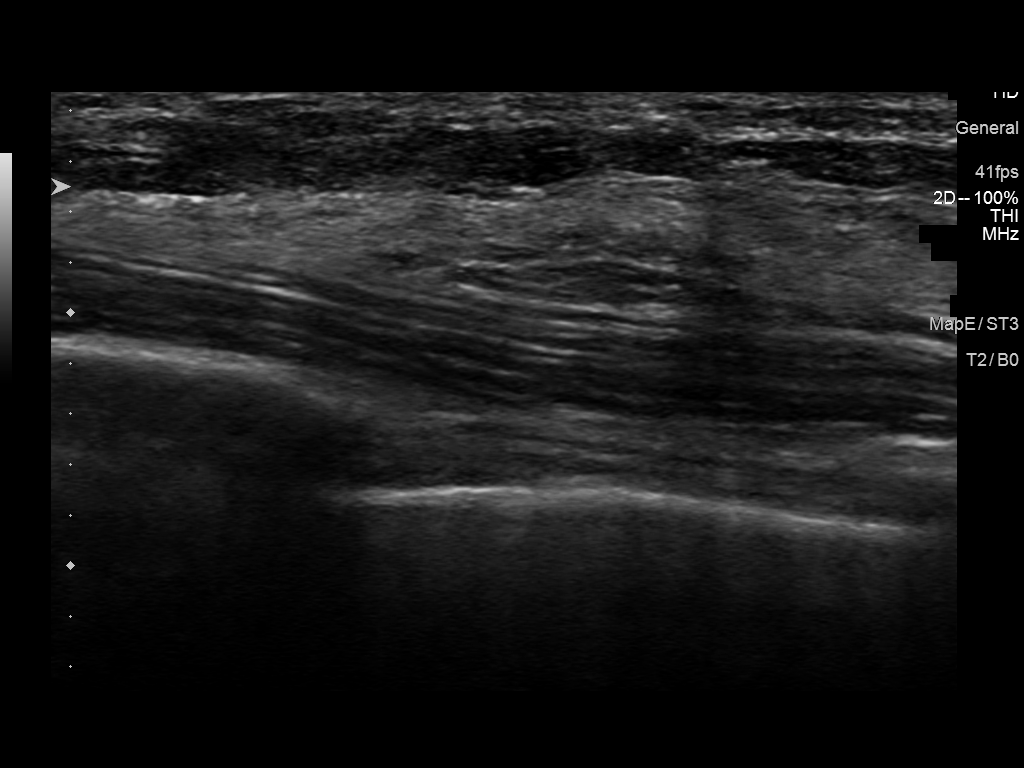
[im 2/2]
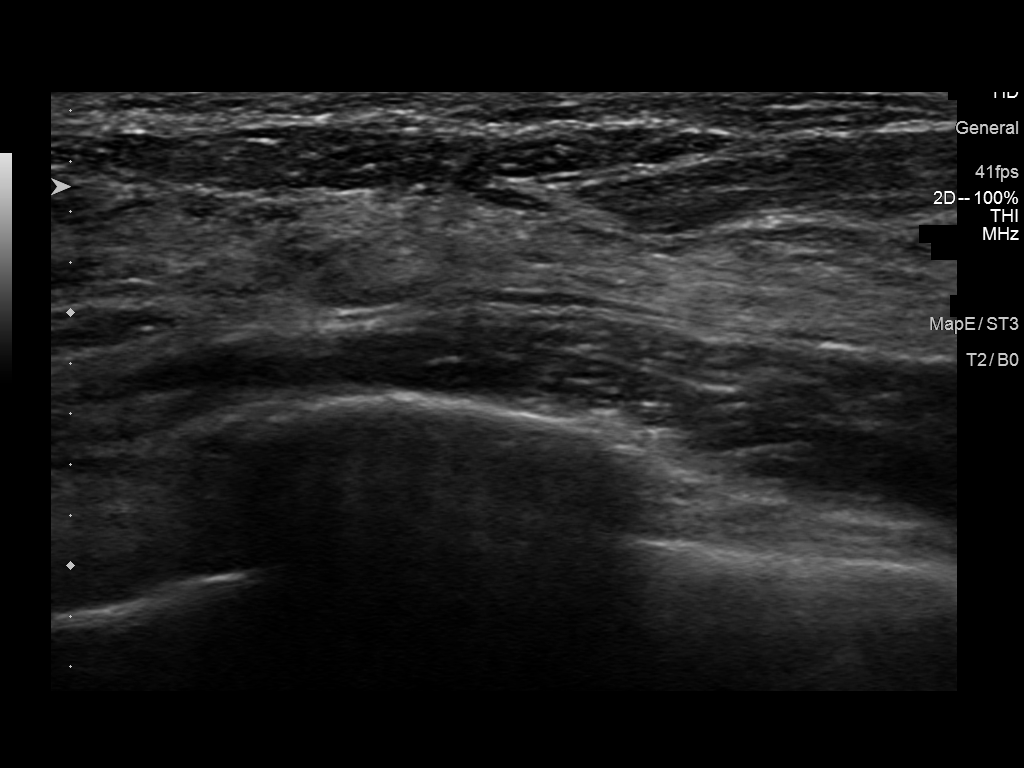

[2 of 2 positions shown; findings below may reference images not displayed]

ACR Breast Density Category c: The breast tissue is heterogeneously
dense, which may obscure small masses.
FINDINGS: On the MLO view, there is a persistent asymmetry in the superior
right breast, just anterior to the patient's surgical scar. There is
no corresponding mass on the CC view. On the MLO view, there does
appear to be fat within the mass suggesting that this is the
patient's normal fibroglandular tissue. Also, it has a similar
appearance to the patient's [YX] mammogram.

Mammographic images were processed with CAD.

Ultrasound targeted to the upper-outer quadrant of the right breast
demonstrates normal fibroglandular tissue. No masses or suspicious
areas of shadowing are identified.
IMPRESSION: The area of concern in the upper-outer quadrant of the patient's
right breast corresponds with normal benign breast tissue.

RECOMMENDATION:
Screening mammogram in one year.(Code:[YX])

I have discussed the findings and recommendations with the patient.
Results were also provided in writing at the conclusion of the
visit. If applicable, a reminder letter will be sent to the patient
regarding the next appointment.

BI-RADS CATEGORY  2: Benign.

## 2018-01-23 DIAGNOSIS — H35363 Drusen (degenerative) of macula, bilateral: Secondary | ICD-10-CM | POA: Diagnosis not present

## 2018-01-23 DIAGNOSIS — H43391 Other vitreous opacities, right eye: Secondary | ICD-10-CM | POA: Diagnosis not present

## 2018-02-14 ENCOUNTER — Ambulatory Visit (INDEPENDENT_AMBULATORY_CARE_PROVIDER_SITE_OTHER): Payer: Medicare Other | Admitting: General Surgery

## 2018-02-14 ENCOUNTER — Inpatient Hospital Stay (INDEPENDENT_AMBULATORY_CARE_PROVIDER_SITE_OTHER): Payer: Medicare Other

## 2018-02-14 ENCOUNTER — Encounter: Payer: Self-pay | Admitting: General Surgery

## 2018-02-14 DIAGNOSIS — E041 Nontoxic single thyroid nodule: Secondary | ICD-10-CM | POA: Diagnosis not present

## 2018-02-14 DIAGNOSIS — E079 Disorder of thyroid, unspecified: Secondary | ICD-10-CM | POA: Diagnosis not present

## 2018-02-14 NOTE — Patient Instructions (Addendum)
The patient is aware to call back for any questions or concerns. Follow up appointment to be announced. 

## 2018-02-14 NOTE — Progress Notes (Signed)
Patient ID: Erica Maldonado, female   DOB: [birth year], 67 y.o.   MRN: [id number]  Chief Complaint  Patient presents with  . Other    HPI Erica Maldonado is a 67 y.o. female here today for a evaluation of a lump in her anterior neck identified while she was stroking her skin.. She noticed this area on 02/09/2018. No pain or trouble swallowing.  Husband, Erica Maldonado is present at visit.   HPI  Past Medical History:  Diagnosis Date  . Abdominal or pelvic swelling, mass, or lump, left lower quadrant   . Epistaxis    s/p cauterization  . Fibroadenoma    right breast  . Hemorrhoids, external, without mention of complication   . Infectious diarrhea(009.2)    history of  . Other bursitis disorders   . Other chest pain   . Scleroderma ([place])   . Sleep disturbance, unspecified   . Umbilical hernia without mention of obstruction or gangrene     Past Surgical History:  Procedure Laterality Date  . BREAST BIOPSY Right 1988   fibroadenoma removed,neg  . BREAST BIOPSY Right 1995   Dr Bary Castilla, neg  . COLONOSCOPY  2001, 2011   Dr Vira Agar  . DILATION AND CURETTAGE OF UTERUS  2001   Dr Laurey Morale  . HYSTEROSCOPY  1995   normal/ Dr Laurey Morale    Family History  Problem Relation Age of Onset  . Breast cancer Mother   . Cancer Mother        breast, developed uterine cancer while on tamoxifen  . Cancer Brother        melanoma/scalp  . Goiter Sister        Twin    Social History Social History   Tobacco Use  . Smoking status: Never Smoker  . Smokeless tobacco: Never Used  . Tobacco comment: Remote history  Substance Use Topics  . Alcohol use: Yes    Comment: daily red wine  . Drug use: No    Allergies  Allergen Reactions  . Naprosyn [Naproxen] Swelling    Throat felt like it was closing  . Biaxin [Clarithromycin] Other (See Comments)    Tongue blisters  . Clarithromycin Rash    Blisters on tongue    Current Outpatient Medications  Medication Sig Dispense Refill  . CALCIUM CITRATE PO  Take 300 mg daily by mouth.    . cholecalciferol (VITAMIN D) 1000 UNITS tablet Take 2,000 Units by mouth daily.    Marland Kitchen estradiol (ESTRACE VAGINAL) 0.1 MG/GM vaginal cream Place 1 Applicatorful vaginally 3 (three) times a week. 42.5 g 12  . Magnesium Ascorbate POWD 125 mg daily by Does not apply route.     No current facility-administered medications for this visit.     Review of Systems Review of Systems  Constitutional: Negative.   Respiratory: Negative.   Cardiovascular: Negative.     Blood pressure 130/80, pulse (!) 51, resp. rate 12, height 5\' 1"  (1.549 m), weight 112 lb (50.8 kg).  Physical Exam Physical Exam  Constitutional: She is oriented to person, place, and time. She appears well-developed and well-nourished.  Neck:    No palpable abnormality or nodularity of the thyroid gland itself is noted.  Cardiovascular: Normal rate, regular rhythm and normal heart sounds.  Pulmonary/Chest: Effort normal and breath sounds normal.  Neurological: She is alert and oriented to person, place, and time.  Skin: Skin is warm and dry.    Data Reviewed Ultrasound examination of the neck was completed.  In the area  of palpable thickening along the isthmus of the gland a 0.5 x 1.0 x 1.28 cm smoothly originated nodule is identified.  No increased vascular flow.  The right lobe of the thyroid measures 0.7 x 0.98 x 2.48 cm there is a cystic lesion in the mid body measuring up to 0.36 cm.  In the right upper pole there is a heterogeneous nodule measuring 0.86 x 0.90 x 0.98 with significant vascular flow, especially on the periphery.  The right lobe of the thyroid measures 0.7 x 1.2 x 3.15 cm.  A few small cystic areas are identified measuring up to the 0.31 cm.  The patient was amenable to FNA sampling of the dominant nodule in the right upper pole.  1 cc of 1% plain Xylocaine was utilized.  2 separate passes with a 22-gauge needle were completed.  Slides x4 were prepared for cytology.  The  procedure was well tolerated with no bleeding noted.  Will defer aspiration of the midline lesion pending cytology report on the dominant nodule in the right upper pole.  Cytology report February 14, 2018:  CONSISTENT WITH BENIGN FOLLICULAR NODULE (BETHESDA CATEGORY II).  Assessment    New neck nodule, dominant right upper pole nodule.    Plan   The patient was contacted the day after the procedure with the cytology report showing a benign thyroid nodule, Bethesda II.  We will plan at this time for a one-month period of observation of the midline nodule with the understanding that she should not manipulate the area.  She will call early if the area becomes uncomfortable or changes in size.  If still present at her next visit we will plan for a FNA of that lesion.   HPI, Physical Exam, Assessment and Plan have been scribed under the direction and in the presence of Erica Ard, MD.  Erica Maldonado, CMA  I have completed the exam and reviewed the above documentation for accuracy and completeness.  I agree with the above.  Haematologist has been used and any errors in dictation or transcription are unintentional.  Erica Maldonado, M.D., F.A.C.S.  Erica Maldonado 02/15/2018, 5:33 PM

## 2018-02-15 ENCOUNTER — Encounter: Payer: Self-pay | Admitting: General Surgery

## 2018-02-15 DIAGNOSIS — E041 Nontoxic single thyroid nodule: Secondary | ICD-10-CM | POA: Insufficient documentation

## 2018-02-22 DIAGNOSIS — D2262 Melanocytic nevi of left upper limb, including shoulder: Secondary | ICD-10-CM | POA: Diagnosis not present

## 2018-02-22 DIAGNOSIS — D224 Melanocytic nevi of scalp and neck: Secondary | ICD-10-CM | POA: Diagnosis not present

## 2018-02-22 DIAGNOSIS — D2271 Melanocytic nevi of right lower limb, including hip: Secondary | ICD-10-CM | POA: Diagnosis not present

## 2018-02-22 DIAGNOSIS — D2272 Melanocytic nevi of left lower limb, including hip: Secondary | ICD-10-CM | POA: Diagnosis not present

## 2018-02-22 DIAGNOSIS — D2261 Melanocytic nevi of right upper limb, including shoulder: Secondary | ICD-10-CM | POA: Diagnosis not present

## 2018-02-22 DIAGNOSIS — D225 Melanocytic nevi of trunk: Secondary | ICD-10-CM | POA: Diagnosis not present

## 2018-03-19 ENCOUNTER — Encounter: Payer: Self-pay | Admitting: General Surgery

## 2018-03-19 ENCOUNTER — Ambulatory Visit: Payer: Medicare Other | Admitting: General Surgery

## 2018-03-19 ENCOUNTER — Ambulatory Visit (INDEPENDENT_AMBULATORY_CARE_PROVIDER_SITE_OTHER): Payer: Medicare Other

## 2018-03-19 VITALS — BP 124/72 | HR 67 | Ht 62.0 in | Wt 113.0 lb

## 2018-03-19 DIAGNOSIS — E041 Nontoxic single thyroid nodule: Secondary | ICD-10-CM

## 2018-03-19 DIAGNOSIS — E0789 Other specified disorders of thyroid: Secondary | ICD-10-CM | POA: Diagnosis not present

## 2018-03-19 NOTE — Progress Notes (Signed)
Patient ID: Erica Maldonado, female   DOB: 03/12/1951, 67 y.o.   MRN: [id number]  Chief Complaint  Patient presents with  . Follow-up    HPI Erica Maldonado is a 67 y.o. female here today for her one month follow up thyroid nodule  She is here with her husband, Erica Maldonado.  HPI ultrasound examination of the lesion in the isthmus was completed.  Past Medical History:  Diagnosis Date  . Abdominal or pelvic swelling, mass, or lump, left lower quadrant   . Epistaxis    s/p cauterization  . Fibroadenoma    right breast  . Hemorrhoids, external, without mention of complication   . Infectious diarrhea(009.2)    history of  . Other bursitis disorders   . Other chest pain   . Scleroderma ([place])   . Sleep disturbance, unspecified   . Umbilical hernia without mention of obstruction or gangrene     Past Surgical History:  Procedure Laterality Date  . BREAST BIOPSY Right 1988   fibroadenoma removed,neg  . BREAST BIOPSY Right 1995   Dr Bary Castilla, neg  . COLONOSCOPY  2001, 2011   Dr Vira Agar  . DILATION AND CURETTAGE OF UTERUS  2001   Dr Laurey Morale  . HYSTEROSCOPY  1995   normal/ Dr Laurey Morale    Family History  Problem Relation Age of Onset  . Breast cancer Mother   . Cancer Mother        breast, developed uterine cancer while on tamoxifen  . Cancer Brother        melanoma/scalp  . Goiter Sister        Twin    Social History Social History   Tobacco Use  . Smoking status: Never Smoker  . Smokeless tobacco: Never Used  . Tobacco comment: Remote history  Substance Use Topics  . Alcohol use: Yes    Comment: daily red wine  . Drug use: No    Allergies  Allergen Reactions  . Naprosyn [Naproxen] Swelling    Throat felt like it was closing  . Biaxin [Clarithromycin] Other (See Comments)    Tongue blisters  . Clarithromycin Rash    Blisters on tongue    Current Outpatient Medications  Medication Sig Dispense Refill  . CALCIUM CITRATE PO Take 300 mg daily by mouth.    .  cholecalciferol (VITAMIN D) 1000 UNITS tablet Take 2,000 Units by mouth daily.    Marland Kitchen estradiol (ESTRACE VAGINAL) 0.1 MG/GM vaginal cream Place 1 Applicatorful vaginally 3 (three) times a week. 42.5 g 12  . Magnesium Ascorbate POWD 125 mg daily by Does not apply route.     No current facility-administered medications for this visit.     Review of Systems Review of Systems  Constitutional: Negative.   Respiratory: Negative.   Cardiovascular: Negative.     Blood pressure 124/72, pulse 67, height 5\' 2"  (1.575 m), weight 113 lb (51.3 kg), SpO2 99 %.  Physical Exam Physical Exam  Constitutional: She is oriented to person, place, and time. She appears well-developed and well-nourished.  Neck:    Neurological: She is alert and oriented to person, place, and time.  Skin: Skin is warm and dry.  Psychiatric: She has a normal mood and affect. Her behavior is normal.    Data Reviewed The lesion in the isthmus measures 0.5 x 1.1 x 1.4 cm, previously measuring 0.5 x 1.0 x 1.28 cm.  The patient was amenable to FNA sampling.  This was completed using 1 cc of 1% plain Xylocaine.  2 sets of slides were prepared after sampling with a 22-gauge needle.  No bleeding noted.  Procedure well-tolerated.  Right upper pole FNA of February 14, 2018:   CONSISTENT WITH BENIGN FOLLICULAR NODULE (BETHESDA CATEGORY II).  Assessment    Isthmus nodule, cytology pending.     Plan    Follow-up pending cytology.     HPI, Physical Exam, Assessment and Plan have been scribed under the direction and in the presence of Robert Bellow, MD. Karie Fetch, RN  I have completed the exam and reviewed the above documentation for accuracy and completeness.  I agree with the above.  Haematologist has been used and any errors in dictation or transcription are unintentional.  Hervey Ard, M.D., F.A.C.S.  Erica Maldonado 03/20/2018, 6:03 AM

## 2018-03-19 NOTE — Patient Instructions (Signed)
The patient is aware to call back for any questions or concerns.  

## 2018-03-20 ENCOUNTER — Telehealth: Payer: Self-pay | Admitting: General Surgery

## 2018-03-20 NOTE — Telephone Encounter (Signed)
Patient notified of benign result.  Bethesda.  II.  We will plan for a follow-up ultrasound in 1 year, earlier if she appreciates any changes.

## 2018-05-03 ENCOUNTER — Encounter: Payer: Self-pay | Admitting: Family

## 2018-05-03 ENCOUNTER — Ambulatory Visit (INDEPENDENT_AMBULATORY_CARE_PROVIDER_SITE_OTHER): Payer: Medicare Other | Admitting: Family

## 2018-05-03 VITALS — BP 118/84 | HR 72 | Temp 98.7°F | Resp 16 | Wt 111.0 lb

## 2018-05-03 DIAGNOSIS — M79672 Pain in left foot: Secondary | ICD-10-CM

## 2018-05-03 NOTE — Patient Instructions (Signed)
Today we discussed referrals, orders. PODIATRY   I have placed these orders in the system for you.  Please be sure to give [place] a call if you have not heard from our office regarding scheduling a test or regarding referral in a timely manner.  It is very important that you let me know as soon as possible.   Pleasure meeting you!

## 2018-05-03 NOTE — Progress Notes (Signed)
Subjective:    Patient ID: Erica Maldonado, female    DOB: Aug 27, 1951, 67 y.o.   MRN: [id number]  CC: Erica Maldonado is a 66 y.o. female who presents today for an acute visit.    HPI: Left foot pain, midfoot for 3 weeks, unchanged. Walking barefoot and walked outside when thinks it happened. Suspects she stepped on something Doesn't recall anything stuck in there. Notes there was round redness at the time, has since resolved.  Tried antbiotic ointment, warm compresses.   Hurts to walk on on her left foot. Some shoes more comfortable than others.   No numbness , tingling, fever, chills, purulent discharge from wound     h/o osteopenia  HISTORY:  Past Medical History:  Diagnosis Date  . Abdominal or pelvic swelling, mass, or lump, left lower quadrant   . Epistaxis    s/p cauterization  . Fibroadenoma    right breast  . Hemorrhoids, external, without mention of complication   . Infectious diarrhea(009.2)    history of  . Other bursitis disorders   . Other chest pain   . Scleroderma ([place])   . Sleep disturbance, unspecified   . Umbilical hernia without mention of obstruction or gangrene    Past Surgical History:  Procedure Laterality Date  . BREAST BIOPSY Right 1988   fibroadenoma removed,neg  . BREAST BIOPSY Right 1995   Dr Bary Castilla, neg  . COLONOSCOPY  2001, 2011   Dr Vira Agar  . DILATION AND CURETTAGE OF UTERUS  2001   Dr Laurey Morale  . HYSTEROSCOPY  1995   normal/ Dr Laurey Morale   Family History  Problem Relation Age of Onset  . Breast cancer Mother   . Cancer Mother        breast, developed uterine cancer while on tamoxifen  . Cancer Brother        melanoma/scalp  . Goiter Sister        Twin    Allergies: Naprosyn [naproxen]; Biaxin [clarithromycin]; and Clarithromycin Current Outpatient Medications on File Prior to Visit  Medication Sig Dispense Refill  . CALCIUM CITRATE PO Take 300 mg daily by mouth.    . cholecalciferol (VITAMIN D) 1000 UNITS tablet Take 2,000  Units by mouth daily.    Marland Kitchen estradiol (ESTRACE VAGINAL) 0.1 MG/GM vaginal cream Place 1 Applicatorful vaginally 3 (three) times a week. 42.5 g 12  . Magnesium Ascorbate POWD 125 mg daily by Does not apply route.     No current facility-administered medications on file prior to visit.     Social History   Tobacco Use  . Smoking status: Never Smoker  . Smokeless tobacco: Never Used  . Tobacco comment: Remote history  Substance Use Topics  . Alcohol use: Yes    Comment: daily red wine  . Drug use: No    Review of Systems  Constitutional: Negative for chills and fever.  Respiratory: Negative for cough.   Cardiovascular: Negative for chest pain and palpitations.  Gastrointestinal: Negative for nausea and vomiting.  Skin: Negative for color change, rash and wound.  Neurological: Negative for numbness.      Objective:    BP 118/84 (BP Location: Left Arm, Patient Position: Sitting, Cuff Size: Normal)   Pulse 72   Temp 98.7 F (37.1 C) (Oral)   Resp 16   Wt 111 lb (50.3 kg)   SpO2 98%   BMI 20.30 kg/m    Physical Exam  Constitutional: She appears well-developed and well-nourished.  Eyes: Conjunctivae are normal.  Cardiovascular:  Normal rate, regular rhythm, normal heart sounds and normal pulses.  Pulmonary/Chest: Effort normal and breath sounds normal. She has no wheezes. She has no rhonchi. She has no rales.  Musculoskeletal:       Feet:  Hardened area of slightly tender skin, approx 1 cm. Not indurated. No erythema, increased warmth. No foreign object seen.   Neurological: She is alert.  Skin: Skin is warm and dry.  Psychiatric: She has a normal mood and affect. Her speech is normal and behavior is normal. Thought content normal.  Vitals reviewed.      Assessment & Plan:   1. Left foot pain Unable to appreciate foreign body. Low suspicion for mortons neuroma. Consult with podiatry.  - Ambulatory referral to Podiatry    I am having Erica Maldonado maintain her  cholecalciferol, estradiol, Magnesium Ascorbate, and CALCIUM CITRATE PO.   No orders of the defined types were placed in this encounter.   Return precautions given.   Risks, benefits, and alternatives of the medications and treatment plan prescribed today were discussed, and patient expressed understanding.   Education regarding symptom management and diagnosis given to patient on AVS.  Continue to follow with Crecencio Mc, MD for routine health maintenance.   Erica Maldonado and I agreed with plan.   Mable Paris, FNP

## 2018-05-28 ENCOUNTER — Ambulatory Visit (INDEPENDENT_AMBULATORY_CARE_PROVIDER_SITE_OTHER): Payer: Medicare Other | Admitting: Podiatry

## 2018-05-28 ENCOUNTER — Encounter: Payer: Self-pay | Admitting: Podiatry

## 2018-05-28 ENCOUNTER — Encounter

## 2018-05-28 DIAGNOSIS — B070 Plantar wart: Secondary | ICD-10-CM

## 2018-05-30 NOTE — Progress Notes (Signed)
   Subjective: 67 year old female presenting today as a new patient with a chief complaint of tenderness to the left plantar forefoot that began 2.5 months ago. Walking increases the pain. She has been resting the foot and staying off of it for treatment. She has also been wearing shoes with padding. Patient is here for further evaluation and treatment.   Past Medical History:  Diagnosis Date  . Abdominal or pelvic swelling, mass, or lump, left lower quadrant   . Epistaxis    s/p cauterization  . Fibroadenoma    right breast  . Hemorrhoids, external, without mention of complication   . Infectious diarrhea(009.2)    history of  . Other bursitis disorders   . Other chest pain   . Scleroderma ([place])   . Sleep disturbance, unspecified   . Umbilical hernia without mention of obstruction or gangrene     Objective: Physical Exam General: The patient is alert and oriented x3 in no acute distress.  Dermatology: Hyperkeratotic skin lesion noted to the plantar aspect of the left foot approximately 1 cm in diameter. Pinpoint bleeding noted upon debridement. Skin is warm, dry and supple bilateral lower extremities. Negative for open lesions or macerations.  Vascular: Palpable pedal pulses bilaterally. No edema or erythema noted. Capillary refill within normal limits.  Neurological: Epicritic and protective threshold grossly intact bilaterally.   Musculoskeletal Exam: Pain on palpation to the note skin lesion.  Range of motion within normal limits to all pedal and ankle joints bilateral. Muscle strength 5/5 in all groups bilateral.   Assessment: #1 plantar wart left forefoot #2 pain in left foot   Plan of Care:  #1 Patient was evaluated. #2 Excisional debridement of the plantar wart lesion was performed using a chisel blade. Cantharone was applied and the lesion was dressed with a dry sterile dressing. #3 patient is to return to clinic in 2 weeks  Edrick Kins, DPM [facility]  Dr. Edrick Kins, [address]                                        [place], [place] [address]                Office [phone]  Fax [phone]

## 2018-06-18 ENCOUNTER — Ambulatory Visit (INDEPENDENT_AMBULATORY_CARE_PROVIDER_SITE_OTHER): Payer: Medicare Other | Admitting: Podiatry

## 2018-06-18 ENCOUNTER — Encounter: Payer: Self-pay | Admitting: Podiatry

## 2018-06-18 DIAGNOSIS — B070 Plantar wart: Secondary | ICD-10-CM | POA: Diagnosis not present

## 2018-06-23 NOTE — Progress Notes (Signed)
   Subjective: 66 year old female presenting today for follow up evaluation of a plantar wart of the left forefoot. She reports some continued tenderness but states it has improved significantly. She denies any new complaints or modifying factors. Patient is here for further evaluation and treatment.   Past Medical History:  Diagnosis Date  . Abdominal or pelvic swelling, mass, or lump, left lower quadrant   . Epistaxis    s/p cauterization  . Fibroadenoma    right breast  . Hemorrhoids, external, without mention of complication   . Infectious diarrhea(009.2)    history of  . Other bursitis disorders   . Other chest pain   . Scleroderma ([place])   . Sleep disturbance, unspecified   . Umbilical hernia without mention of obstruction or gangrene     Objective: Physical Exam General: The patient is alert and oriented x3 in no acute distress.  Dermatology: Hyperkeratotic skin lesion noted to the plantar aspect of the left foot approximately 1 cm in diameter. Pinpoint bleeding noted upon debridement. Skin is warm, dry and supple bilateral lower extremities. Negative for open lesions or macerations.  Vascular: Palpable pedal pulses bilaterally. No edema or erythema noted. Capillary refill within normal limits.  Neurological: Epicritic and protective threshold grossly intact bilaterally.   Musculoskeletal Exam: Pain on palpation to the note skin lesion.  Range of motion within normal limits to all pedal and ankle joints bilateral. Muscle strength 5/5 in all groups bilateral.   Assessment: #1 plantar wart left forefoot - resolved    Plan of Care:  #1 Patient was evaluated. #2 Light debridement of the plantar wart lesion was performed using a tissue nipper. Salinocaine was applied and the lesion was dressed with a dry sterile dressing. #3 recommended good shoe gear.  #4 patient is to return to clinic as needed.   Edrick Kins, DPM [facility]  Dr. Edrick Kins, [address]                                        [place],  [address]                Office [phone]  Fax [phone]

## 2018-06-24 DIAGNOSIS — H43811 Vitreous degeneration, right eye: Secondary | ICD-10-CM | POA: Diagnosis not present

## 2018-06-25 ENCOUNTER — Encounter: Payer: Self-pay | Admitting: Internal Medicine

## 2018-08-09 ENCOUNTER — Telehealth: Payer: Self-pay | Admitting: Internal Medicine

## 2018-08-09 DIAGNOSIS — E782 Mixed hyperlipidemia: Secondary | ICD-10-CM

## 2018-08-09 DIAGNOSIS — E041 Nontoxic single thyroid nodule: Secondary | ICD-10-CM

## 2018-08-09 DIAGNOSIS — E559 Vitamin D deficiency, unspecified: Secondary | ICD-10-CM

## 2018-08-09 DIAGNOSIS — R5383 Other fatigue: Secondary | ICD-10-CM

## 2018-08-09 DIAGNOSIS — R7301 Impaired fasting glucose: Secondary | ICD-10-CM

## 2018-08-09 NOTE — Telephone Encounter (Signed)
Perfect thanks

## 2018-08-09 NOTE — Telephone Encounter (Signed)
Pt is requesting labs before her physical in November. If she needs labs could Dr. Derrel Nip order the labs so a lab can be schedule before her appt with [place].

## 2018-08-09 NOTE — Telephone Encounter (Signed)
Ordered CBC, CMP, TSH, A1C, lipid, and vitamin D. Is there anything else that needs to be ordered before pt's appt on 10/04/2018?

## 2018-08-12 ENCOUNTER — Other Ambulatory Visit: Payer: Self-pay | Admitting: Internal Medicine

## 2018-08-12 ENCOUNTER — Telehealth: Payer: Self-pay | Admitting: Internal Medicine

## 2018-08-12 DIAGNOSIS — Z1231 Encounter for screening mammogram for malignant neoplasm of breast: Secondary | ICD-10-CM

## 2018-08-12 NOTE — Telephone Encounter (Signed)
Lab appt is scheduled

## 2018-09-27 ENCOUNTER — Other Ambulatory Visit (INDEPENDENT_AMBULATORY_CARE_PROVIDER_SITE_OTHER): Payer: Medicare Other

## 2018-09-27 DIAGNOSIS — E782 Mixed hyperlipidemia: Secondary | ICD-10-CM | POA: Diagnosis not present

## 2018-09-27 DIAGNOSIS — R5383 Other fatigue: Secondary | ICD-10-CM

## 2018-09-27 DIAGNOSIS — R7301 Impaired fasting glucose: Secondary | ICD-10-CM

## 2018-09-27 DIAGNOSIS — E559 Vitamin D deficiency, unspecified: Secondary | ICD-10-CM | POA: Diagnosis not present

## 2018-09-27 DIAGNOSIS — E041 Nontoxic single thyroid nodule: Secondary | ICD-10-CM

## 2018-09-27 NOTE — Addendum Note (Signed)
Addended by: Arby Barrette on: 09/27/2018 08:05 AM   Modules accepted: Orders

## 2018-09-28 LAB — CBC WITH DIFFERENTIAL/PLATELET
BASOS ABS: 20 {cells}/uL (ref 0–200)
Basophils Relative: 0.4 %
EOS PCT: 0.4 %
Eosinophils Absolute: 20 cells/uL (ref 15–500)
HCT: 40.7 % (ref 35.0–45.0)
HEMOGLOBIN: 13.6 g/dL (ref 11.7–15.5)
LYMPHS ABS: 1093 {cells}/uL (ref 850–3900)
MCH: 30.3 pg (ref 27.0–33.0)
MCHC: 33.4 g/dL (ref 32.0–36.0)
MCV: 90.6 fL (ref 80.0–100.0)
MONOS PCT: 9.3 %
MPV: 10.7 fL (ref 7.5–12.5)
NEUTROS ABS: 3312 {cells}/uL (ref 1500–7800)
Neutrophils Relative %: 67.6 %
Platelets: 204 10*3/uL (ref 140–400)
RBC: 4.49 10*6/uL (ref 3.80–5.10)
RDW: 12.7 % (ref 11.0–15.0)
Total Lymphocyte: 22.3 %
WBC mixed population: 456 cells/uL (ref 200–950)
WBC: 4.9 10*3/uL (ref 3.8–10.8)

## 2018-09-28 LAB — COMPREHENSIVE METABOLIC PANEL
AG Ratio: 2.1 (calc) (ref 1.0–2.5)
ALBUMIN MSPROF: 4.5 g/dL (ref 3.6–5.1)
ALKALINE PHOSPHATASE (APISO): 57 U/L (ref 33–130)
ALT: 13 U/L (ref 6–29)
AST: 21 U/L (ref 10–35)
BUN: 18 mg/dL (ref 7–25)
CHLORIDE: 104 mmol/L (ref 98–110)
CO2: 25 mmol/L (ref 20–32)
CREATININE: 0.65 mg/dL (ref 0.50–0.99)
Calcium: 9.5 mg/dL (ref 8.6–10.4)
GLOBULIN: 2.1 g/dL (ref 1.9–3.7)
Glucose, Bld: 83 mg/dL (ref 65–99)
POTASSIUM: 4.1 mmol/L (ref 3.5–5.3)
Sodium: 143 mmol/L (ref 135–146)
Total Bilirubin: 0.6 mg/dL (ref 0.2–1.2)
Total Protein: 6.6 g/dL (ref 6.1–8.1)

## 2018-09-28 LAB — LIPID PANEL
CHOL/HDL RATIO: 2.7 (calc) (ref <5.0)
Cholesterol: 226 mg/dL — ABNORMAL HIGH (ref <200)
HDL: 83 mg/dL (ref >50)
LDL Cholesterol (Calc): 128 mg/dL (calc) — ABNORMAL HIGH
NON-HDL CHOLESTEROL (CALC): 143 mg/dL — AB (ref <130)
TRIGLYCERIDES: 63 mg/dL (ref <150)

## 2018-09-28 LAB — TSH: TSH: 2.83 m[IU]/L (ref 0.40–4.50)

## 2018-09-28 LAB — HEMOGLOBIN A1C
Hgb A1c MFr Bld: 5.2 % of total Hgb (ref <5.7)
Mean Plasma Glucose: 103 (calc)
eAG (mmol/L): 5.7 (calc)

## 2018-09-28 LAB — VITAMIN D 25 HYDROXY (VIT D DEFICIENCY, FRACTURES): Vit D, 25-Hydroxy: 37 ng/mL (ref 30–100)

## 2018-09-30 ENCOUNTER — Ambulatory Visit
Admission: RE | Admit: 2018-09-30 | Discharge: 2018-09-30 | Disposition: A | Discharge status: Home / Self Care | Payer: Medicare Other | Source: Ambulatory Visit | Attending: Internal Medicine | Admitting: Internal Medicine

## 2018-09-30 DIAGNOSIS — Z1231 Encounter for screening mammogram for malignant neoplasm of breast: Secondary | ICD-10-CM | POA: Insufficient documentation

## 2018-09-30 IMAGING — MG DIGITAL SCREENING BILATERAL MAMMOGRAM WITH TOMO AND CAD
8 series · 9 of 24 positions shown · non-contrast
Comparison: Previous exam(s).

CLINICAL DATA: Screening.

EXAM:
DIGITAL SCREENING BILATERAL MAMMOGRAM WITH TOMO AND CAD

[R CC synth-2D]
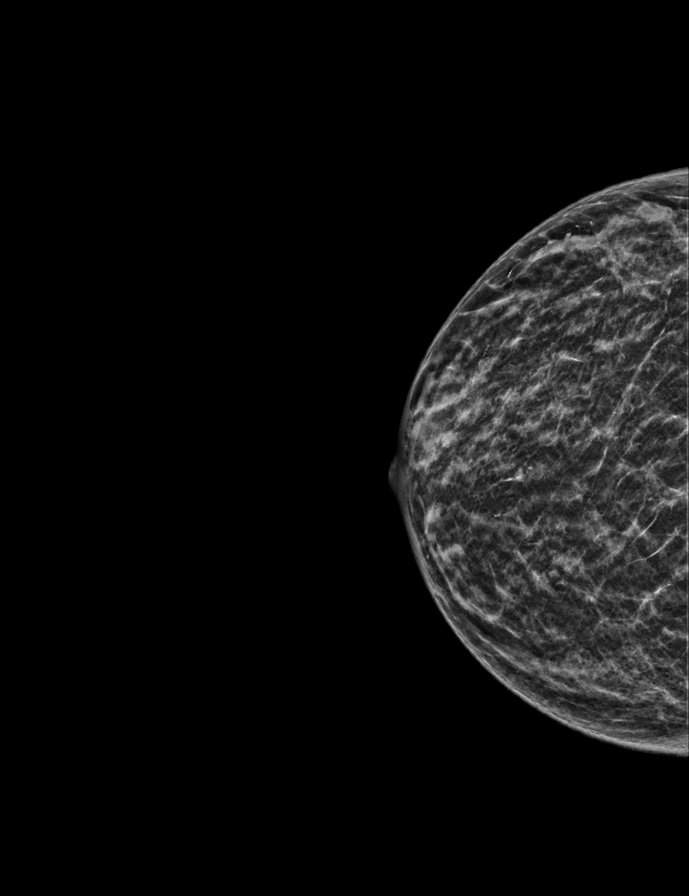

[L CC synth-2D]
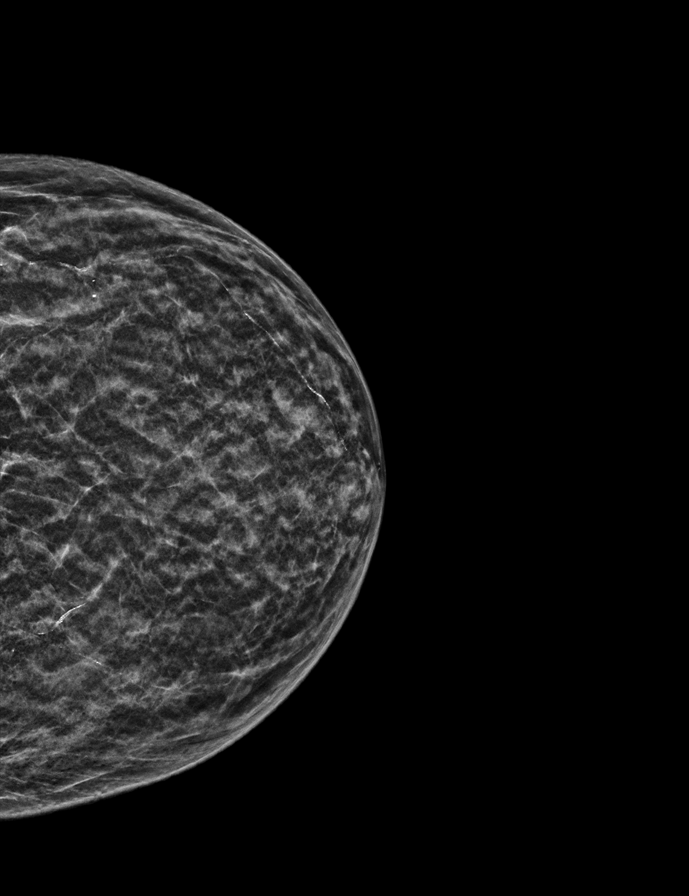

[L MLO synth-2D]
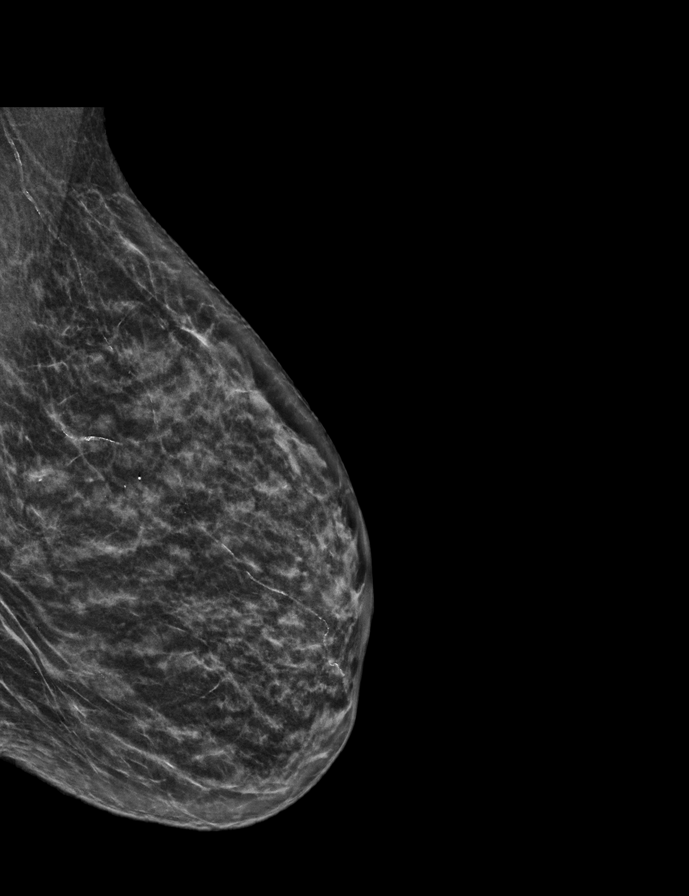

[R MLO synth-2D]
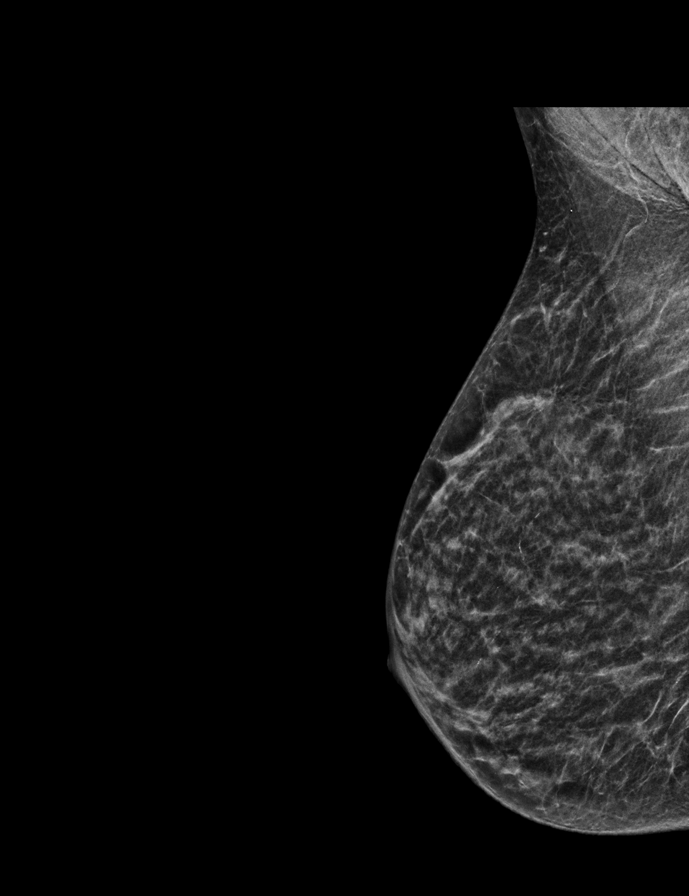

[L CC tomo · 2 of 29 frames shown]
[frame 10/29]
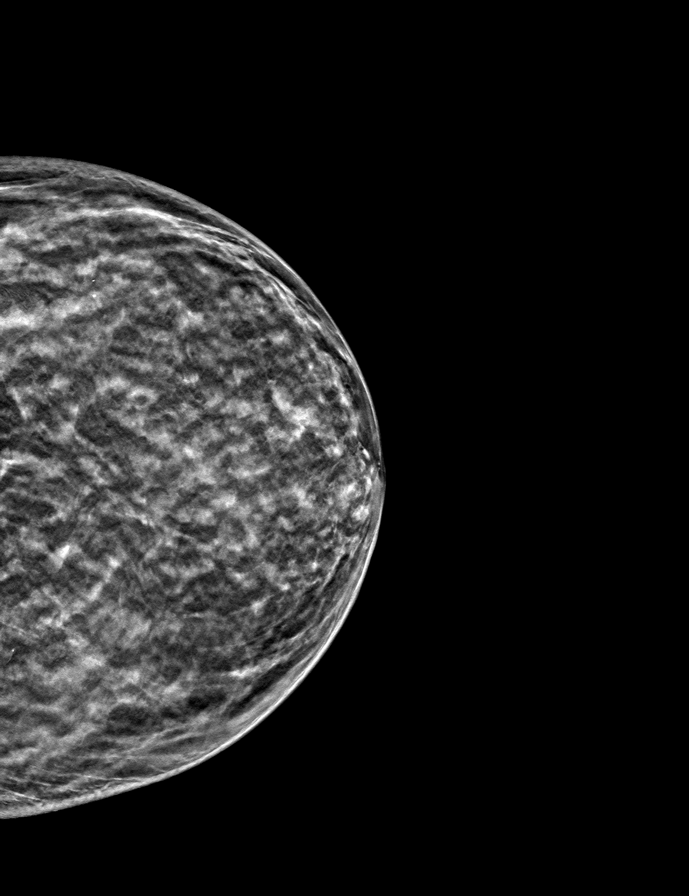
[frame 15/29]
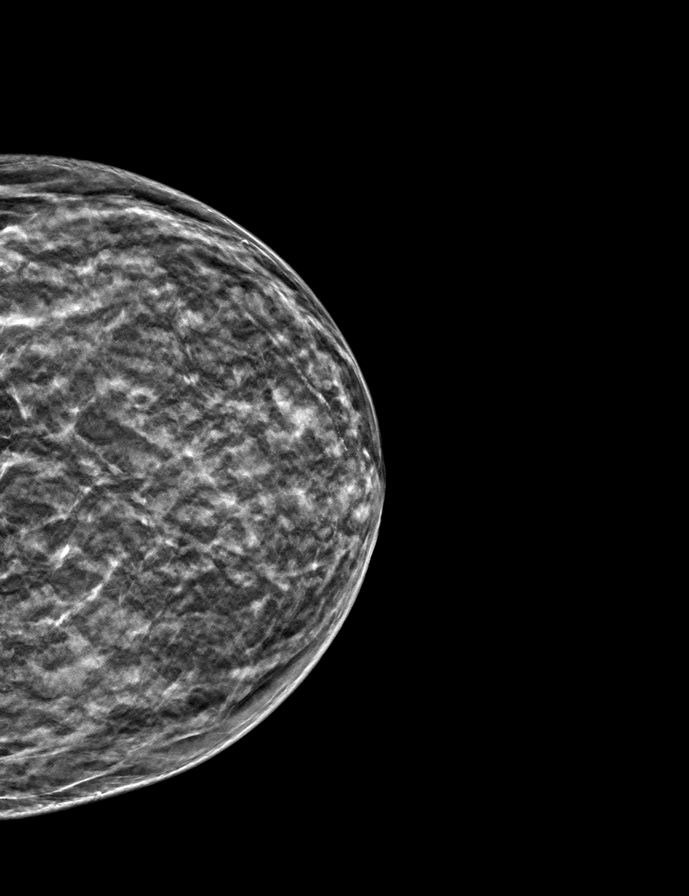

[R MLO tomo · tomo slice 19/36.0]
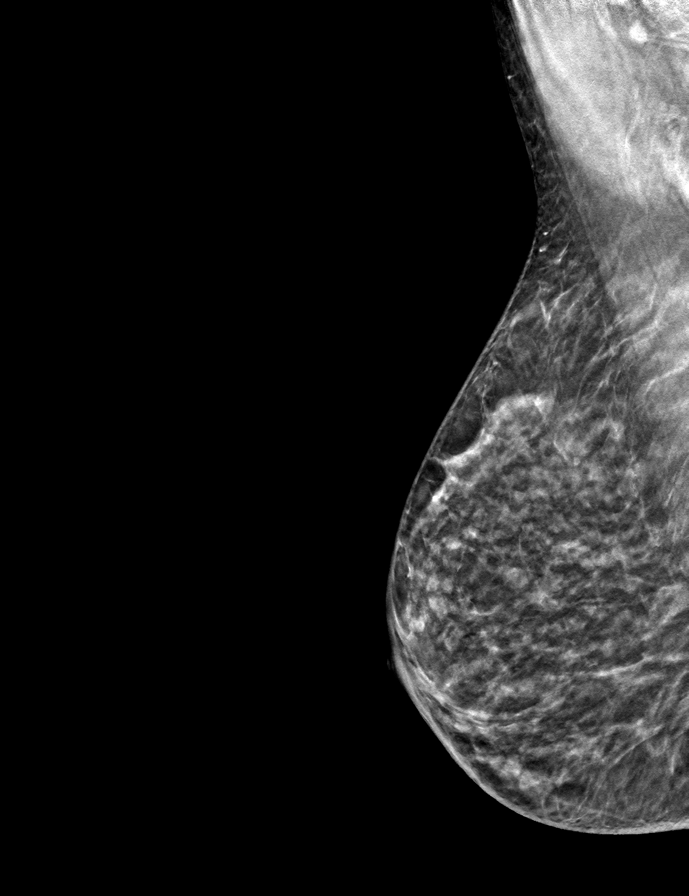

[R CC tomo · tomo slice 15/28.0]
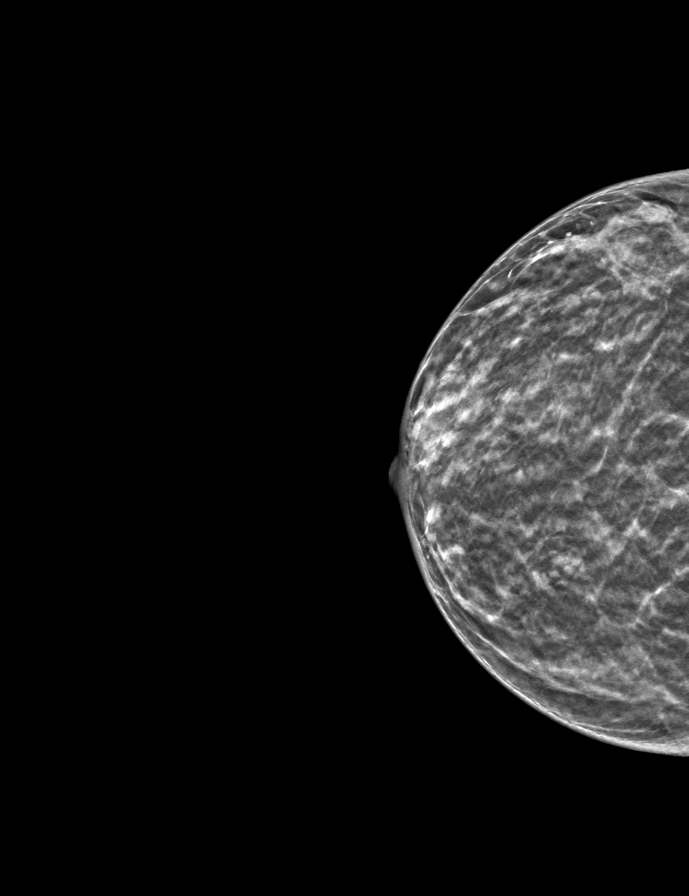

[L MLO tomo · tomo slice 25/48.0]
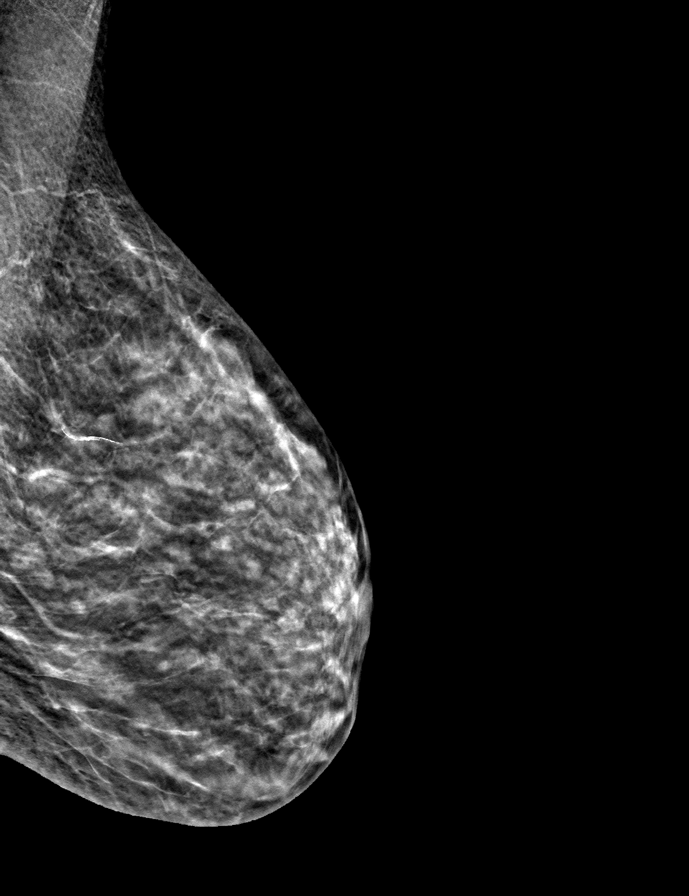

[9 of 24 positions shown; findings below may reference images not displayed]

ACR Breast Density Category c: The breast tissue is heterogeneously
dense, which may obscure small masses.
FINDINGS: There are no findings suspicious for malignancy. Images were
processed with CAD.
IMPRESSION: No mammographic evidence of malignancy. A result letter of this
screening mammogram will be mailed directly to the patient.

RECOMMENDATION:
Screening mammogram in one year. (Code:[5V])

BI-RADS CATEGORY  1: Negative.

## 2018-10-04 ENCOUNTER — Encounter: Payer: Medicare Other | Admitting: Internal Medicine

## 2018-10-04 ENCOUNTER — Ambulatory Visit: Payer: Medicare Other

## 2018-10-08 ENCOUNTER — Encounter: Payer: Self-pay | Admitting: Internal Medicine

## 2018-10-08 ENCOUNTER — Ambulatory Visit (INDEPENDENT_AMBULATORY_CARE_PROVIDER_SITE_OTHER): Payer: Medicare Other

## 2018-10-08 ENCOUNTER — Ambulatory Visit (INDEPENDENT_AMBULATORY_CARE_PROVIDER_SITE_OTHER): Payer: Medicare Other | Admitting: Internal Medicine

## 2018-10-08 VITALS — BP 120/78 | HR 78 | Temp 98.4°F | Resp 14 | Ht 63.0 in | Wt 110.4 lb

## 2018-10-08 DIAGNOSIS — M8588 Other specified disorders of bone density and structure, other site: Secondary | ICD-10-CM | POA: Diagnosis not present

## 2018-10-08 DIAGNOSIS — Z124 Encounter for screening for malignant neoplasm of cervix: Secondary | ICD-10-CM | POA: Diagnosis not present

## 2018-10-08 DIAGNOSIS — Z0000 Encounter for general adult medical examination without abnormal findings: Secondary | ICD-10-CM

## 2018-10-08 DIAGNOSIS — K5904 Chronic idiopathic constipation: Secondary | ICD-10-CM | POA: Diagnosis not present

## 2018-10-08 DIAGNOSIS — Z8049 Family history of malignant neoplasm of other genital organs: Secondary | ICD-10-CM | POA: Insufficient documentation

## 2018-10-08 MED ORDER — ZOSTER VAC RECOMB ADJUVANTED 50 MCG/0.5ML IM SUSR: 0.5000 mL | Freq: Once | INTRAMUSCULAR | 1 refills | Status: AC

## 2018-10-08 MED ORDER — ESTRADIOL 0.1 MG/GM VA CREA: 1.0000 | TOPICAL_CREAM | VAGINAL | 12 refills | Status: DC

## 2018-10-08 NOTE — Progress Notes (Addendum)
Subjective:   Erica Maldonado is a 67 y.o. female who presents for Medicare Annual (Subsequent) preventive examination.  Review of Systems:  No ROS.  Medicare Wellness Visit. Additional risk factors are reflected in the social history. Cardiac Risk Factors include: advanced age (>75men, >77 women)     Objective:     Vitals: BP 120/78 (BP Location: Left Arm, Patient Position: Sitting, Cuff Size: Normal)   Pulse 78   Temp 98.4 F (36.9 C) (Oral)   Resp 14   Ht 5\' 3"  (1.6 m)   Wt 110 lb 6.4 oz (50.1 kg)   SpO2 98%   BMI 19.56 kg/m   Body mass index is 19.56 kg/m.  Advanced Directives 10/08/2018  Does Patient Have a Medical Advance Directive? Yes  Type of Paramedic of [place];Living will  Does patient want to make changes to medical advance directive? No - Patient declined  Copy of [place] in Chart? Yes - validated most recent copy scanned in chart (See row information)    Tobacco Social History   Tobacco Use  Smoking Status Never Smoker  Smokeless Tobacco Never Used  Tobacco Comment   Remote history     Counseling given: Not Answered Comment: Remote history   Clinical Intake:  Pre-visit preparation completed: Yes  Pain : No/denies pain     Nutritional Status: BMI of 19-24  Normal Diabetes: No  How often do you need to have someone help you when you read instructions, pamphlets, or other written materials from your doctor or pharmacy?: 1 - Never  Interpreter Needed?: No     Past Medical History:  Diagnosis Date  . Abdominal or pelvic swelling, mass, or lump, left lower quadrant   . Epistaxis    s/p cauterization  . Fibroadenoma    right breast  . Hemorrhoids, external, without mention of complication   . Infectious diarrhea(009.2)    history of  . Other bursitis disorders   . Other chest pain   . Scleroderma ([place])   . Sleep disturbance, unspecified   . Umbilical hernia without mention of obstruction  or gangrene    Past Surgical History:  Procedure Laterality Date  . BREAST BIOPSY Right 1988   fibroadenoma removed,neg  . BREAST BIOPSY Right 1995   Dr Bary Castilla, neg  . COLONOSCOPY  2001, 2011   Dr Vira Agar  . DILATION AND CURETTAGE OF UTERUS  2001   Dr Laurey Morale  . HYSTEROSCOPY  1995   normal/ Dr Laurey Morale   Family History  Problem Relation Age of Onset  . Breast cancer Mother 67  . Cancer Mother        breast, developed uterine cancer while on tamoxifen  . Cancer Brother        melanoma/scalp  . Goiter Sister        Twin   Social History   Socioeconomic History  . Marital status: Married    Spouse name: Not on file  . Number of children: Not on file  . Years of education: Not on file  . Highest education level: Not on file  Occupational History  . Occupation: Marine scientist- full time  Social Needs  . Financial resource strain: Not hard at all  . Food insecurity:    Worry: Never true    Inability: Never true  . Transportation needs:    Medical: No    Non-medical: No  Tobacco Use  . Smoking status: Never Smoker  . Smokeless tobacco: Never Used  .  Tobacco comment: Remote history  Substance and Sexual Activity  . Alcohol use: Yes    Comment: daily red wine  . Drug use: No  . Sexual activity: Not on file  Lifestyle  . Physical activity:    Days per week: 5 days    Minutes per session: 60 min  . Stress: Not at all  Relationships  . Social connections:    Talks on phone: Not on file    Gets together: Not on file    Attends religious service: Not on file    Active member of club or organization: Not on file    Attends meetings of clubs or organizations: Not on file    Relationship status: Not on file  Other Topics Concern  . Not on file  Social History Narrative   Lives with spouse, daughter.    Has a cat.   Always uses seat belts.   Heavy exercise.   Gets annual pap smears by gyn, has annual mammograms at [facility].    Outpatient Encounter Medications as of  10/08/2018  Medication Sig  . CALCIUM-MAGNESIUM-VITAMIN D PO Take 1 capsule by mouth 2 (two) times daily.  . cholecalciferol (VITAMIN D) 1000 UNITS tablet Take 2,000 Units by mouth daily.  Marland Kitchen estradiol (ESTRACE VAGINAL) 0.1 MG/GM vaginal cream Place 1 Applicatorful vaginally 3 (three) times a week.  . Magnesium Ascorbate POWD 125 mg daily by Does not apply route.  . Nutritional Supplements (NUTRITIONAL SUPPLEMENT PO) Take 1 Dose by mouth daily as needed.  . Omega-3 Fatty Acids (FISH OIL) 1000 MG CAPS Take 1 capsule by mouth.  Marland Kitchen PREBIOTIC PRODUCT PO Take 750 mg by mouth daily.  . [DISCONTINUED] CALCIUM CITRATE PO Take 300 mg daily by mouth.   No facility-administered encounter medications on file as of 10/08/2018.     Activities of Daily Living In your present state of health, do you have any difficulty performing the following activities: 10/08/2018  Hearing? N  Vision? N  Difficulty concentrating or making decisions? N  Walking or climbing stairs? N  Dressing or bathing? N  Doing errands, shopping? N  Preparing Food and eating ? N  Using the Toilet? N  In the past six months, have you accidently leaked urine? N  Do you have problems with loss of bowel control? N  Managing your Medications? N  Managing your Finances? N  Housekeeping or managing your Housekeeping? N  Some recent data might be hidden    Patient Care Team: Crecencio Mc, MD as PCP - General (Internal Medicine) Bary Castilla Forest Gleason, MD as Consulting Physician (General Surgery) Requested, Self    Assessment:   This is a routine wellness examination for [place].  The goal of the wellness visit is to assist the patient how to close the gaps in care and create a preventative care plan for the patient.   The roster of all physicians providing medical care to patient is listed in the Snapshot section of the chart.  No acute issues to be addressed by her doctor today.  She plans to discuss continued issues with  constipation with pcp in same day appointment. .   Osteopenia. Taking calcium VIT D as appropriate/Osteoporosis risk reviewed.    Safety issues reviewed; Smoke and carbon monoxide detectors in the home. No firearms in the home. Wears seatbelts when driving or riding with others. No violence in the home.  They do not have excessive sun exposure.  Discussed the need for sun protection: hats, long sleeves  and the use of sunscreen if there is significant sun exposure.  Patient is alert, normal appearance, oriented to person/place/and time. Correctly identified the president of the [place] and recalls of 3/3 words. Performs simple calculations and can read correct time from watch face. Displays appropriate judgement.  No new identified risk were noted.  No failures at ADL's or IADL's.   BMI- discussed the importance of a healthy diet, water intake and the benefits of aerobic exercise. She has a healthy diet, adequate water intake and exercises with spin, pilates, weights and strength training exercises 5 days a week, 60 minutes.    Dental- every 4 months.  Eye- Visual acuity not assessed per patient preference since they have regular follow up with the ophthalmologist.  Wears corrective lenses.  Pneumococcal vaccine discussed; deferred for follow up with pcp.   Exercise Activities and Dietary recommendations Current Exercise Habits: Structured exercise class, Type of exercise: calisthenics;strength training/weights(pilates, bicycling), Time (Minutes): 60, Frequency (Times/Week): 5, Weekly Exercise (Minutes/Week): 300, Intensity: Intense  Goals    . Maintain healthy lifestyle       Fall Risk Fall Risk  10/08/2018 09/28/2017 09/27/2016  Falls in the past year? 0 No Yes  Injury with Fall? - - No  Risk for fall due to : - - Other (Comment)   Depression Screen PHQ 2/9 Scores 10/08/2018 09/28/2017 09/27/2016  PHQ - 2 Score 0 0 0  PHQ- 9 Score - 0 -     Cognitive Function MMSE - Mini Mental  State Exam 10/08/2018  Orientation to time 5  Orientation to Place 5  Registration 3  Attention/ Calculation 5  Recall 3  Language- name 2 objects 2  Language- repeat 1  Language- follow 3 step command 3  Language- read & follow direction 1  Write a sentence 1  Copy design 1  Total score 30        Immunization History  Administered Date(s) Administered  . Influenza-Unspecified 08/27/2014, 09/17/2015, 08/08/2016, 07/31/2017, 08/01/2018  . Td 08/26/2007  . Tdap 10/04/2017   Screening Tests Health Maintenance  Topic Date Due  . PNA vac Low Risk Adult (1 of 2 - PCV13) 11/11/2015  . MAMMOGRAM  09/30/2020  . COLONOSCOPY  12/04/2021  . TETANUS/TDAP  10/05/2027  . INFLUENZA VACCINE  Completed  . DEXA SCAN  Completed  . Hepatitis C Screening  Completed      Plan:    End of life planning; Advance aging; Advanced directives discussed. Copy of current HCPOA/Living Will on file.    I have personally reviewed and noted the following in the patient's chart:   . Medical and social history . Use of alcohol, tobacco or illicit drugs  . Current medications and supplements . Functional ability and status . Nutritional status . Physical activity . Advanced directives . List of other physicians . Hospitalizations, surgeries, and ER visits in previous 12 months . Vitals . Screenings to include cognitive, depression, and falls . Referrals and appointments  In addition, I have reviewed and discussed with patient certain preventive protocols, quality metrics, and best practice recommendations. A written personalized care plan for preventive services as well as general preventive health recommendations were provided to patient.     OBrien-Blaney, Tyrin Herbers L, LPN  [id number]    I have reviewed the above information and agree with above.   Deborra Medina, MD

## 2018-10-08 NOTE — Progress Notes (Signed)
Patient ID: Erica Maldonado, female    DOB: [birth year]  Age: 67 y.o. MRN: [id number]  The patient is here for  examination and management of other chronic and acute problems.   The risk factors are reflected in the social history.  The roster of all physicians providing medical care to patient - is listed in the Snapshot section of the chart.  Activities of daily living:  The patient is 100% independent in all ADLs: dressing, toileting, feeding as well as independent mobility  Home safety : The patient has smoke detectors in the home. They wear seatbelts.  There are no firearms at home. There is no violence in the home.   There is no risks for hepatitis, STDs or HIV. There is no   history of blood transfusion. They have no travel history to infectious disease endemic areas of the world.  The patient has seen their dentist in the last six month. They have seen their eye doctor in the last year. They admit to slight hearing difficulty with regard to whispered voices and some television programs.  They have deferred audiologic testing in the last year.  They do not  have excessive sun exposure. Discussed the need for sun protection: hats, long sleeves and use of sunscreen if there is significant sun exposure.   Diet: the importance of a healthy diet is discussed. They do have a healthy diet.  The benefits of regular aerobic exercise were discussed. She exercises 5 days per week ,  60 minutes.   Depression screen: there are no signs or vegative symptoms of depression- irritability, change in appetite, anhedonia, sadness/tearfullness.  Cognitive assessment: the patient manages all their financial and personal affairs and is actively engaged. They could relate day,date,year and events; recalled 2/3 objects at 3 minutes; performed clock-face test normally.  The following portions of the patient's history were reviewed and updated as appropriate: allergies, current medications, past family history, past  medical history,  past surgical history, past social history  and problem list.  Visual acuity was not assessed per patient preference since she has regular follow up with her ophthalmologist. Hearing and body mass index were assessed and reviewed.   During the course of the visit the patient was educated and counseled about appropriate screening and preventive services including : fall prevention , diabetes screening, nutrition counseling, colorectal cancer screening, and recommended immunizations.    CC: Diagnoses of Family history of uterine cancer, Osteopenia of spine, Screening for cervical cancer, and Functional constipation were pertinent to this visit.  Chronic constipation    Moves bowels every 5 days ,  Using prunes , prebiotic fiber in divided doses to prevent bloating and improve bowel regimen .  Osteopenia by DEXA 2016 spine -2.2 History Erica Maldonado has a past medical history of Abdominal or pelvic swelling, mass, or lump, left lower quadrant, Epistaxis, Fibroadenoma, Hemorrhoids, external, without mention of complication, Infectious diarrhea(009.2), Other bursitis disorders, Other chest pain, Scleroderma ([place]), Sleep disturbance, unspecified, and Umbilical hernia without mention of obstruction or gangrene.   She has a past surgical history that includes Hysteroscopy (1995); Dilation and curettage of uterus (2001); Colonoscopy (2001, 2011); Breast biopsy (Right, 1988); and Breast biopsy (Right, 1995).   Her family history includes Breast cancer (age of onset: 70) in her mother; Cancer in her brother and mother; Goiter in her sister.She reports that she has never smoked. She has never used smokeless tobacco. She reports that she drinks alcohol. She reports that she does not use drugs.  Outpatient  Medications Prior to Visit  Medication Sig Dispense Refill  . CALCIUM-MAGNESIUM-VITAMIN D PO Take 1 capsule by mouth 2 (two) times daily.    . cholecalciferol (VITAMIN D) 1000 UNITS tablet Take  2,000 Units by mouth daily.    . Magnesium Ascorbate POWD 125 mg daily by Does not apply route.    Marland Kitchen estradiol (ESTRACE VAGINAL) 0.1 MG/GM vaginal cream Place 1 Applicatorful vaginally 3 (three) times a week. 42.5 g 12   No facility-administered medications prior to visit.     Review of Systems   Patient denies headache, fevers, malaise, unintentional weight loss, skin rash, eye pain, sinus congestion and sinus pain, sore throat, dysphagia,  hemoptysis , cough, dyspnea, wheezing, chest pain, palpitations, orthopnea, edema, abdominal pain, nausea, melena, diarrhea, constipation, flank pain, dysuria, hematuria, urinary  Frequency, nocturia, numbness, tingling, seizures,  Focal weakness, Loss of consciousness,  Tremor, insomnia, depression, anxiety, and suicidal ideation.      Objective:  BP 120/78 (BP Location: Left Arm, Patient Position: Sitting, Cuff Size: Normal)   Pulse 78   Temp 98.4 F (36.9 C) (Oral)   Resp 14   Ht 5\' 3"  (1.6 m)   Wt 110 lb 6.4 oz (50.1 kg)   SpO2 99%   BMI 19.56 kg/m   Physical Exam   General appearance: alert, cooperative and appears stated age Ears: normal TM's and external ear canals both ears Throat: lips, mucosa, and tongue normal; teeth and gums normal Neck: no adenopathy, no carotid bruit, supple, symmetrical, trachea midline and thyroid not enlarged, symmetric, no tenderness/mass/nodules Back: symmetric, no curvature. ROM normal. No CVA tenderness. Lungs: clear to auscultation bilaterally Heart: regular rate and rhythm, S1, S2 normal, no murmur, click, rub or gallop Abdomen: soft, non-tender; bowel sounds normal; no masses,  no organomegaly Pulses: 2+ and symmetric Skin: Skin color, texture, turgor normal. No rashes or lesions Lymph nodes: Cervical, supraclavicular, and axillary nodes normal.    Assessment & Plan:   Problem List Items Addressed This Visit    Family history of uterine cancer   Functional constipation    Discussed additional  sources of soluble fiber to add to diet.       Osteopenia    She is not interested in pharmacotherapy at this time. Discussed the current controversies surrounding the risks and benefits of calcium supplementation.  Encouraged her to increase dietary calcium through natural foods including almond/coconut milk      Relevant Orders   DG Bone Density   Screening for cervical cancer    Screening completed.          A total of 25 minutes of face to face time was spent with patient more than half of which was spent in counselling about the above mentioned conditions  and coordination of care  I am having Erica Maldonado "Erica Maldonado" start on Zoster Vaccine Adjuvanted. I am also having her maintain her cholecalciferol, Magnesium Ascorbate, CALCIUM-MAGNESIUM-VITAMIN D PO, and estradiol.  Meds ordered this encounter  Medications  . Zoster Vaccine Adjuvanted [facility]) injection    Sig: Inject 0.5 mLs into the muscle once for 1 dose.    Dispense:  1 each    Refill:  1  . estradiol (ESTRACE VAGINAL) 0.1 MG/GM vaginal cream    Sig: Place 1 Applicatorful vaginally 3 (three) times a week.    Dispense:  42.5 g    Refill:  12    Medications Discontinued During This Encounter  Medication Reason  . estradiol (ESTRACE VAGINAL) 0.1 MG/GM vaginal  cream Reorder    Follow-up: Return in about 1 year (around 10/09/2019).   Crecencio Mc, MD

## 2018-10-08 NOTE — Patient Instructions (Addendum)
  Erica Maldonado , Thank you for taking time to come for your Medicare Wellness Visit. I appreciate your ongoing commitment to your health goals. Please review the following plan we discussed and let me know if I can assist you in the future.   These are the goals we discussed: Goals    . Maintain healthy lifestyle       This is a list of the screening recommended for you and due dates:  Health Maintenance  Topic Date Due  . Pneumonia vaccines (1 of 2 - PCV13) 11/11/2015  . Mammogram  09/30/2020  . Colon Cancer Screening  12/04/2021  . Tetanus Vaccine  10/05/2027  . Flu Shot  Completed  . DEXA scan (bone density measurement)  Completed  .  Hepatitis C: One time screening is recommended by Center for Disease Control  (CDC) for  adults born from [birth year] through 1966.   Completed

## 2018-10-08 NOTE — Patient Instructions (Addendum)
YOUR LABS ARE EXCELLENT!  try drinking 16 ounces of  Room temperature water when you first wake up [place] scan to be ordered because your spine T score was -2.2 in 2016 (very close to the diagnosis of osteoporosis)    You need 1200 to 1800mg  calcium daily and 2000 Ius of D3   I recommend getting the majority of your calcium and Vitamin D  through diet rather than supplements given the recent association of calcium supplements with increased coronary artery calcium scores  Unsweetened almond/coconut milk is a great low calorie low carb way to increase your dietary calcium and vitamin D.     The shingShingRx vaccine is now available in local pharmacies and is much more protective thant Zostavaxs,  It is therefore ADVISED for all interested adults over 50 to prevent shingles   PAP SMEAR NEXT YEAR  (OPTIONAL)

## 2018-10-10 ENCOUNTER — Encounter: Payer: Self-pay | Admitting: Internal Medicine

## 2018-10-10 NOTE — Assessment & Plan Note (Signed)
She is not interested in pharmacotherapy at this time. Discussed the current controversies surrounding the risks and benefits of calcium supplementation.  Encouraged her to increase dietary calcium through natural foods including almond/coconut milk

## 2018-10-10 NOTE — Assessment & Plan Note (Signed)
Discussed additional sources of soluble fiber to add to diet.

## 2018-10-10 NOTE — Assessment & Plan Note (Signed)
Screening completed.

## 2019-01-06 ENCOUNTER — Ambulatory Visit (INDEPENDENT_AMBULATORY_CARE_PROVIDER_SITE_OTHER): Payer: Medicare Other | Admitting: Family

## 2019-01-06 ENCOUNTER — Encounter: Payer: Self-pay | Admitting: Family

## 2019-01-06 VITALS — BP 124/76 | HR 89 | Temp 97.5°F | Wt 108.0 lb

## 2019-01-06 DIAGNOSIS — R1032 Left lower quadrant pain: Secondary | ICD-10-CM

## 2019-01-06 DIAGNOSIS — R140 Abdominal distension (gaseous): Secondary | ICD-10-CM

## 2019-01-06 DIAGNOSIS — R1012 Left upper quadrant pain: Secondary | ICD-10-CM | POA: Diagnosis not present

## 2019-01-06 NOTE — Assessment & Plan Note (Signed)
Chronic constipation, largely unchanged.  Discussed titrating MiraLAX, Colace which are agents she has  not tried.  Also discussed chronic distention, will do CT abdomen pelvis along with Ca1 25 to evaluate for  ovarian pathology.  Has appointment upcoming with Dr. Terri Piedra ( GI) which she will keep.  She will follow-up with her PCP.

## 2019-01-06 NOTE — Patient Instructions (Addendum)
I would try colace then miralax as we discussed  Keep appointment with Dr Terri Piedra  We will call to schedule your CT abdomen Thurs/Friday this week  Nice to meet you!

## 2019-01-06 NOTE — Addendum Note (Signed)
Addended by: Burnard Hawthorne on: 01/06/2019 12:13 PM   Modules accepted: Orders

## 2019-01-06 NOTE — Progress Notes (Signed)
Subjective:    Patient ID: Erica Maldonado, female    DOB: [birth year], 68 y.o.   MRN: [id number]  CC: Erica Maldonado is a 68 y.o. female who presents today for follow up.   HPI: Here today to discuss chronic constipation, 'always had constipation'    Has been gradual since menopause in late 9's.   Drinking plenty of water, urine is pale yellow. Eating greens, lots spinach, prunes twice per day.   BM every 3rd day or every 6 days.  Last BM 2 days ago after 2 dulcolax ; prior to had 5 days of trying OTC medications. Hasnt tried miralax. Doesn't feel like empties stool completely. No early satiety, nausea while eating, epigastric burning. Eats very little bread. No pasta.    Endorses bloating. Passing gas. No vomiting, fever, severe abdominal pain. Some abdominal discomfort on the left side 'when colon is full.'   Very frustrated.   Tried linzess, which helped however caused abdominal distention. tried amitiza with SOB. Marland Kitchen Known hemorrhoids. NO blood in stool. No melena.  Doculax, Fleet enema, magnesium, senna peppermint tea.   Seeing Erica Maldonado March 31.   h/o hysterectomy; ovaries intact.   Colonoscopy 2013; repeat 10 years.   HISTORY:  Past Medical History:  Diagnosis Date  . Abdominal or pelvic swelling, mass, or lump, left lower quadrant   . Epistaxis    s/p cauterization  . Fibroadenoma    right breast  . Hemorrhoids, external, without mention of complication   . Infectious diarrhea(009.2)    history of  . Other bursitis disorders   . Other chest pain   . Scleroderma ([place])   . Sleep disturbance, unspecified   . Umbilical hernia without mention of obstruction or gangrene    Past Surgical History:  Procedure Laterality Date  . BREAST BIOPSY Right 1988   fibroadenoma removed,neg  . BREAST BIOPSY Right 1995   Dr Erica Maldonado, neg  . COLONOSCOPY  2001, 2011   Dr Erica Maldonado  . DILATION AND CURETTAGE OF UTERUS  2001   Dr Erica Maldonado  . HYSTEROSCOPY  1995   normal/ Dr Erica Maldonado    Family History  Problem Relation Age of Onset  . Breast cancer Mother 11  . Cancer Mother        breast, developed uterine cancer while on tamoxifen  . Cancer Brother        melanoma/scalp  . Goiter Sister        Twin    Allergies: Amitiza [lubiprostone]; Naprosyn [naproxen]; Biaxin [clarithromycin]; and Clarithromycin Current Outpatient Medications on File Prior to Visit  Medication Sig Dispense Refill  . CALCIUM-MAGNESIUM-VITAMIN D PO Take 1 capsule by mouth 2 (two) times daily.    . cholecalciferol (VITAMIN D) 1000 UNITS tablet Take 2,000 Units by mouth daily.    Marland Kitchen estradiol (ESTRACE VAGINAL) 0.1 MG/GM vaginal cream Place 1 Applicatorful vaginally 3 (three) times a week. 42.5 g 12  . Magnesium Ascorbate POWD 125 mg daily by Does not apply route.    . Nutritional Supplements (NUTRITIONAL SUPPLEMENT PO) Take 1 Dose by mouth daily as needed.    . Omega-3 Fatty Acids (FISH OIL) 1000 MG CAPS Take 1 capsule by mouth.    Marland Kitchen PREBIOTIC PRODUCT PO Take 750 mg by mouth daily.     No current facility-administered medications on file prior to visit.     Social History   Tobacco Use  . Smoking status: Never Smoker  . Smokeless tobacco: Never Used  . Tobacco comment: Remote history  Substance Use Topics  . Alcohol use: Yes    Comment: daily red wine  . Drug use: No    Review of Systems  Constitutional: Negative for chills and fever.  Respiratory: Negative for cough.   Cardiovascular: Negative for chest pain and palpitations.  Gastrointestinal: Positive for abdominal distention and constipation. Negative for abdominal pain, blood in stool, diarrhea, nausea and vomiting.      Objective:    BP 124/76 (BP Location: Left Arm, Patient Position: Sitting, Cuff Size: Normal)   Pulse 89   Temp (!) 97.5 F (36.4 C)   Wt 108 lb (49 kg)   SpO2 98%   BMI 19.13 kg/m  BP Readings from Last 3 Encounters:  01/06/19 124/76  10/08/18 120/78  10/08/18 120/78   Wt Readings from Last 3  Encounters:  01/06/19 108 lb (49 kg)  10/08/18 110 lb 6.4 oz (50.1 kg)  10/08/18 110 lb 6.4 oz (50.1 kg)    Physical Exam Vitals signs reviewed.  Constitutional:      Appearance: Normal appearance. She is well-developed.  Eyes:     Conjunctiva/sclera: Conjunctivae normal.  Cardiovascular:     Rate and Rhythm: Normal rate and regular rhythm.     Pulses: Normal pulses.     Heart sounds: Normal heart sounds.  Pulmonary:     Effort: Pulmonary effort is normal.     Breath sounds: Normal breath sounds. No wheezing, rhonchi or rales.  Abdominal:     General: Bowel sounds are normal. There is no distension.     Palpations: Abdomen is soft. Abdomen is not rigid. There is no fluid wave or mass.     Tenderness: There is no abdominal tenderness. There is no guarding or rebound.     Comments: Abdomen is flat, non distended.   Skin:    General: Skin is warm and dry.  Neurological:     Mental Status: She is alert.  Psychiatric:        Speech: Speech normal.        Behavior: Behavior normal.        Thought Content: Thought content normal.        Assessment & Plan:   Problem List Items Addressed This Visit      Other   Abdominal distension - Primary    Chronic constipation, largely unchanged.  Discussed titrating MiraLAX, Colace which are agents she has  not tried.  Also discussed chronic distention, will do CT abdomen pelvis along with Ca1 25 to evaluate for  ovarian pathology.  Has appointment upcoming with Dr. Terri Maldonado ( GI) which she will keep.  She will follow-up with her PCP.      Relevant Orders   CA 125    Other Visit Diagnoses    Left lower quadrant abdominal pain       Relevant Orders   CT ABDOMEN PELVIS W CONTRAST   Abdominal distension (gaseous)       Relevant Orders   CT ABDOMEN PELVIS W CONTRAST       I am having Erica Maldonado "Erica Maldonado" maintain her cholecalciferol, Magnesium Ascorbate, Fish Oil, CALCIUM-MAGNESIUM-VITAMIN D PO, PREBIOTIC PRODUCT PO,  Nutritional Supplements (NUTRITIONAL SUPPLEMENT PO), and estradiol.   No orders of the defined types were placed in this encounter.   Return precautions given.   Risks, benefits, and alternatives of the medications and treatment plan prescribed today were discussed, and patient expressed understanding.   Education regarding symptom management and diagnosis given to patient on AVS.  Continue to follow with Crecencio Mc, MD for routine health maintenance.   Erica Maldonado and I agreed with plan.   Mable Paris, FNP

## 2019-01-07 LAB — CA 125: CA 125: 8 U/mL (ref <35)

## 2019-01-16 ENCOUNTER — Ambulatory Visit
Admission: RE | Admit: 2019-01-16 | Discharge: 2019-01-16 | Disposition: A | Discharge status: Home / Self Care | Payer: Medicare Other | Source: Ambulatory Visit | Attending: Family | Admitting: Family

## 2019-01-16 DIAGNOSIS — K769 Liver disease, unspecified: Secondary | ICD-10-CM | POA: Insufficient documentation

## 2019-01-16 DIAGNOSIS — R140 Abdominal distension (gaseous): Secondary | ICD-10-CM | POA: Diagnosis not present

## 2019-01-16 DIAGNOSIS — K7689 Other specified diseases of liver: Secondary | ICD-10-CM | POA: Diagnosis not present

## 2019-01-16 DIAGNOSIS — K5900 Constipation, unspecified: Secondary | ICD-10-CM | POA: Insufficient documentation

## 2019-01-16 LAB — POCT I-STAT CREATININE: Creatinine, Ser: 0.8 mg/dL (ref 0.44–1.00)

## 2019-01-16 IMAGING — CT CT ABD-PELV W/ CM
2 of 5 series · 11 of 46 positions shown, 12 images · IV contrast (omnipaque)
Comparison: Abdominal radiographs [DATE].
COMPARISON: Abdominal radiographs [DATE].
COMPARISON: Abdominal radiographs [DATE].

Addendum:
CLINICAL DATA: 67-year-old female with increasing postprandial
abdominal distension and constipation for 6 months.

EXAM:
CT ABDOMEN AND PELVIS WITH CONTRAST
TECHNIQUE: Multidetector CT imaging of the abdomen and pelvis was performed
using the standard protocol following bolus administration of
intravenous contrast.
CONTRAST:  80mL OMNIPAQUE IOHEXOL 300 MG/ML  SOLN

[Series 2: abd pelvis · axial · 0.65mm/px · z∈[-1496,-1146]mm · 8 of 80 slices shown, 9 images (1 of 2)]
[im 5/80  soft-tissue]
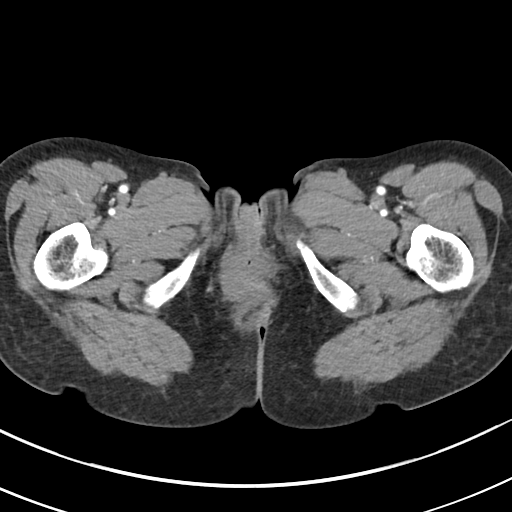
[im 5/80  bone]
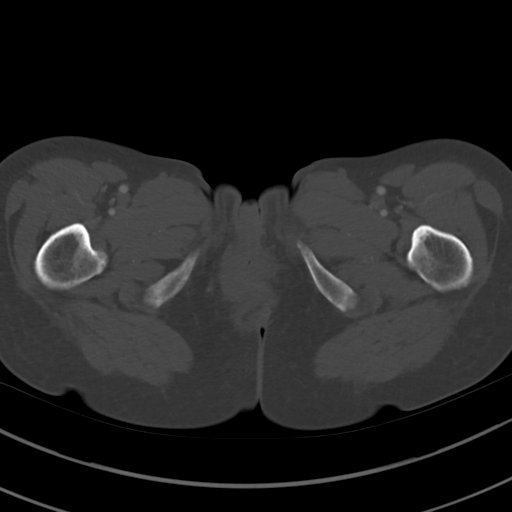
[im 15/80  soft-tissue]
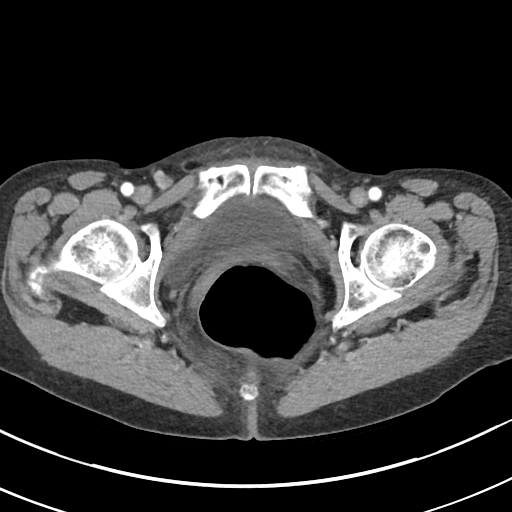
[im 25/80  soft-tissue]
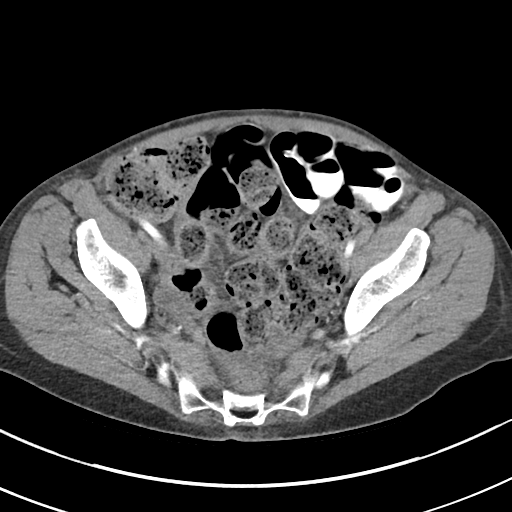
[im 35/80  soft-tissue]
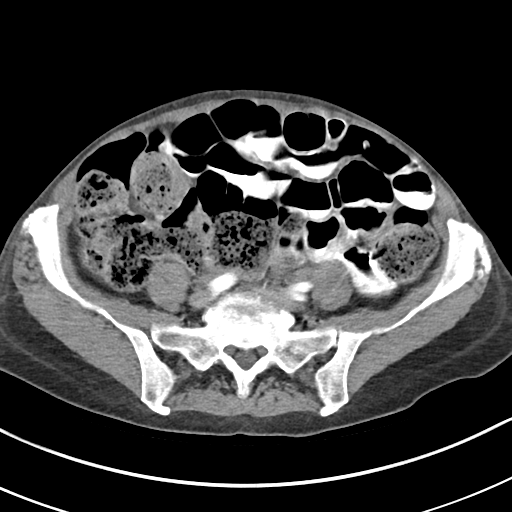
[im 45/80  soft-tissue]
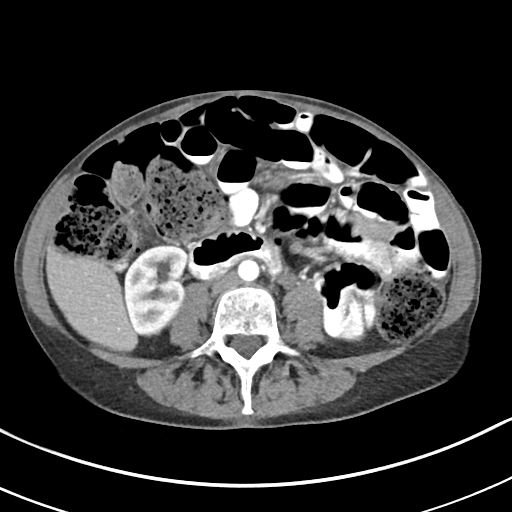
[im 55/80  soft-tissue]
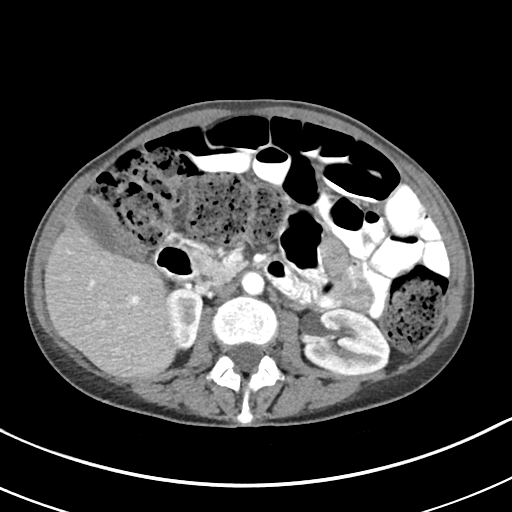
[im 65/80  soft-tissue]
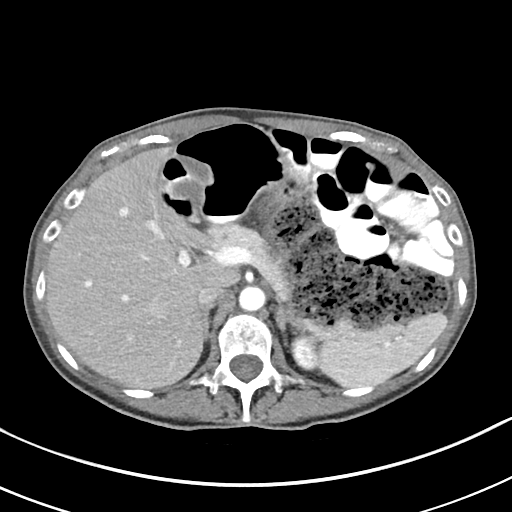
[im 75/80  soft-tissue]
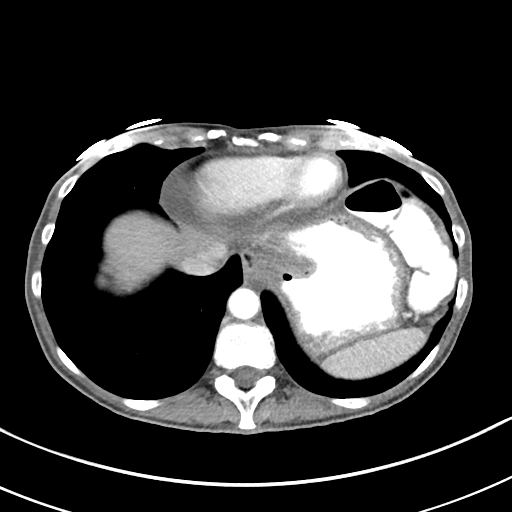

[Series 5: abd pelvis · coronal · 0.65mm/px · 3 of 121 slices shown (2 of 2)]
[im 41/121  soft-tissue]
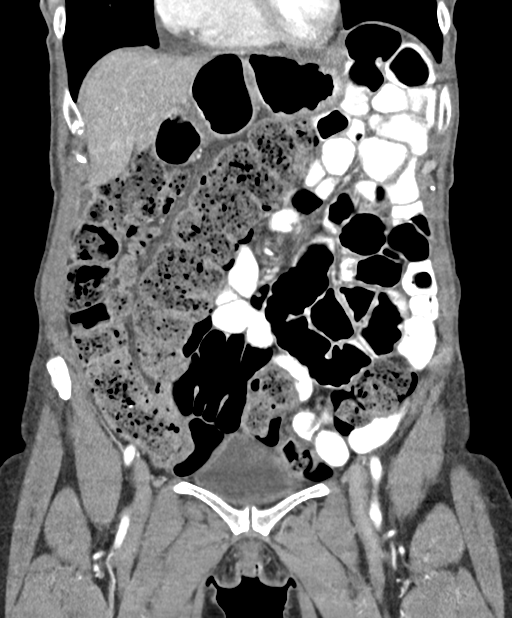
[im 54/121  soft-tissue]
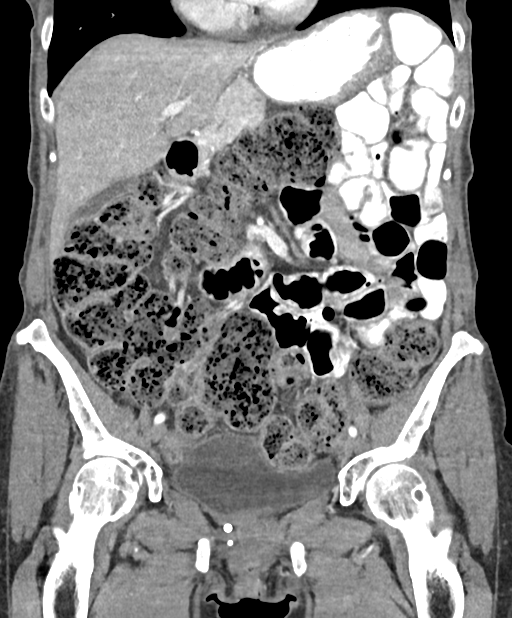
[im 67/121  soft-tissue]
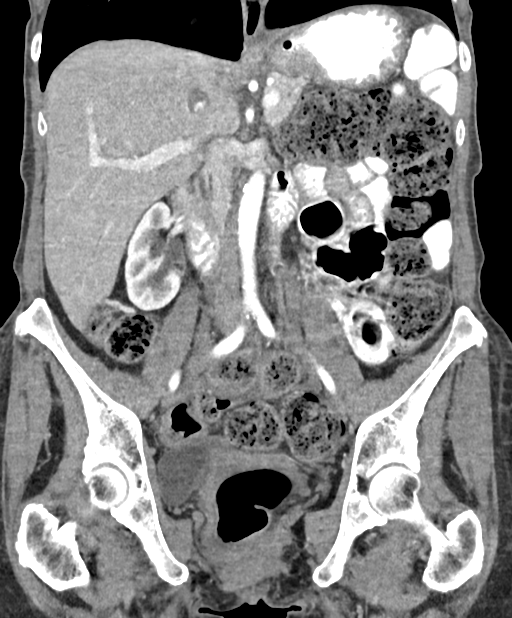

[11 of 46 positions shown; findings below may reference images not displayed]

FINDINGS: Lower chest: Negative. No cardiomegaly, pericardial effusion or
pleural effusion.

Hepatobiliary: Negative gallbladder. In the right hepatic lobe there
is a solitary small hypodense area in the liver seen on series 2,
image 14 which fades on delayed images and is probably a benign
hemangioma. A similar lesion is seen near the central liver on
series 2, image 14. However, there are larger more indeterminate
hypodense central liver lesions which are abutting and appear to be
mildly compressing the hepatic IVC (series 2 images 8 and 10). IVC
thrombus is less likely. These are up to 2 centimeters diameter with
mildly irregular margins.

Pancreas: Negative.

Spleen: Negative.

Adrenals/Urinary Tract: Normal adrenal glands.

Symmetric and normal bilateral renal enhancement and contrast
excretion. Negative visible ureters.

Fairly diminutive and unremarkable urinary bladder. Incidental
pelvic phleboliths.

Stomach/Bowel: Gaseous distension of the distal rectum. The proximal
rectum and distal sigmoid are decompressed, but there is a large
volume of retained stool elsewhere throughout the large bowel. The
large bowel is redundant, especially the sigmoid and transverse
segments. Oral contrast was administered but has not yet reached the
terminal ileum or large bowel. Appendix is not delineated. No
dilated small bowel. Negative stomach and duodenum.

No free air or free fluid.

Vascular/Lymphatic: Minimal for age atherosclerosis. Major arterial
structures are patent. The portal venous system appears patent.

The hepatic IVC is difficult to evaluate as described in the section
above.

No lymphadenopathy.

Reproductive: Negative.  Retroflexed uterus.

Other: No pelvic free fluid.

Musculoskeletal: Mildly heterogeneous bone mineralization throughout
the visible skeleton. No acute or suspicious osseous lesion
identified.
IMPRESSION: 1. Indeterminate 2 cm liver lesions adjacent to and somewhat
distorting the hepatic IVC.
IVC thrombus is felt less likely.
Recommend abdomen MRI (Liver protocol without and with contrast) to
further characterize.

2. Large volume of retained stool in redundant large bowel
compatible with constipation. No bowel inflammation or obstruction
identified.

ADDENDUM:
Study discussed by telephone with provider JUMPER on
[DATE] at [YL] hours.

*** End of Addendum ***
Addendum:
FINDINGS: Lower chest: Negative. No cardiomegaly, pericardial effusion or
pleural effusion.

Hepatobiliary: Negative gallbladder. In the right hepatic lobe there
is a solitary small hypodense area in the liver seen on series 2,
image 14 which fades on delayed images and is probably a benign
hemangioma. A similar lesion is seen near the central liver on
series 2, image 14. However, there are larger more indeterminate
hypodense central liver lesions which are abutting and appear to be
mildly compressing the hepatic IVC (series 2 images 8 and 10). IVC
thrombus is less likely. These are up to 2 centimeters diameter with
mildly irregular margins.

Pancreas: Negative.

Spleen: Negative.

Adrenals/Urinary Tract: Normal adrenal glands.

Symmetric and normal bilateral renal enhancement and contrast
excretion. Negative visible ureters.

Fairly diminutive and unremarkable urinary bladder. Incidental
pelvic phleboliths.

Stomach/Bowel: Gaseous distension of the distal rectum. The proximal
rectum and distal sigmoid are decompressed, but there is a large
volume of retained stool elsewhere throughout the large bowel. The
large bowel is redundant, especially the sigmoid and transverse
segments. Oral contrast was administered but has not yet reached the
terminal ileum or large bowel. Appendix is not delineated. No
dilated small bowel. Negative stomach and duodenum.

No free air or free fluid.

Vascular/Lymphatic: Minimal for age atherosclerosis. Major arterial
structures are patent. The portal venous system appears patent.

The hepatic IVC is difficult to evaluate as described in the section
above.

No lymphadenopathy.

Reproductive: Negative.  Retroflexed uterus.

Other: No pelvic free fluid.

Musculoskeletal: Mildly heterogeneous bone mineralization throughout
the visible skeleton. No acute or suspicious osseous lesion
identified.
IMPRESSION: 1. Indeterminate 2 cm liver lesions adjacent to and somewhat
distorting the hepatic IVC.
IVC thrombus is felt less likely.
Recommend abdomen MRI (Liver protocol without and with contrast) to
further characterize.

2. Large volume of retained stool in redundant large bowel
compatible with constipation. No bowel inflammation or obstruction
identified.

ADDENDUM:
Study discussed by telephone with provider JUMPER on
[DATE] at [YL] hours.

*** End of Addendum ***
FINDINGS: Lower chest: Negative. No cardiomegaly, pericardial effusion or
pleural effusion.

Hepatobiliary: Negative gallbladder. In the right hepatic lobe there
is a solitary small hypodense area in the liver seen on series 2,
image 14 which fades on delayed images and is probably a benign
hemangioma. A similar lesion is seen near the central liver on
series 2, image 14. However, there are larger more indeterminate
hypodense central liver lesions which are abutting and appear to be
mildly compressing the hepatic IVC (series 2 images 8 and 10). IVC
thrombus is less likely. These are up to 2 centimeters diameter with
mildly irregular margins.

Pancreas: Negative.

Spleen: Negative.

Adrenals/Urinary Tract: Normal adrenal glands.

Symmetric and normal bilateral renal enhancement and contrast
excretion. Negative visible ureters.

Fairly diminutive and unremarkable urinary bladder. Incidental
pelvic phleboliths.

Stomach/Bowel: Gaseous distension of the distal rectum. The proximal
rectum and distal sigmoid are decompressed, but there is a large
volume of retained stool elsewhere throughout the large bowel. The
large bowel is redundant, especially the sigmoid and transverse
segments. Oral contrast was administered but has not yet reached the
terminal ileum or large bowel. Appendix is not delineated. No
dilated small bowel. Negative stomach and duodenum.

No free air or free fluid.

Vascular/Lymphatic: Minimal for age atherosclerosis. Major arterial
structures are patent. The portal venous system appears patent.

The hepatic IVC is difficult to evaluate as described in the section
above.

No lymphadenopathy.

Reproductive: Negative.  Retroflexed uterus.

Other: No pelvic free fluid.

Musculoskeletal: Mildly heterogeneous bone mineralization throughout
the visible skeleton. No acute or suspicious osseous lesion
identified.
IMPRESSION: 1. Indeterminate 2 cm liver lesions adjacent to and somewhat
distorting the hepatic IVC.
IVC thrombus is felt less likely.
Recommend abdomen MRI (Liver protocol without and with contrast) to
further characterize.

2. Large volume of retained stool in redundant large bowel
compatible with constipation. No bowel inflammation or obstruction
identified.

## 2019-01-16 MED ORDER — IOHEXOL 300 MG/ML  SOLN
80.0000 mL | Freq: Once | INTRAMUSCULAR | Status: AC | PRN
  Administered 2019-01-16: 80 mL via INTRAVENOUS

## 2019-01-17 ENCOUNTER — Other Ambulatory Visit: Payer: Self-pay | Admitting: Family

## 2019-01-17 ENCOUNTER — Telehealth: Payer: Self-pay | Admitting: Family

## 2019-01-17 ENCOUNTER — Encounter: Payer: Self-pay | Admitting: Family

## 2019-01-17 DIAGNOSIS — K769 Liver disease, unspecified: Secondary | ICD-10-CM

## 2019-01-17 NOTE — Telephone Encounter (Signed)
Spoke with patient in regard to CT Ab  She is seeing Dr Terri Piedra 02/18/19  Having small bowel movements on colace 3 x per week. Plans to start miralax.   Discussed liver lesions and small concern for IVC  thrombis after my conversation with Dr Nevada Crane this morning   No pacemaker.  Advised to discuss atherosclerosis with PCP.   Advised that we are working on MRI Ab Stat which is

## 2019-01-18 ENCOUNTER — Ambulatory Visit
Admission: RE | Admit: 2019-01-18 | Discharge: 2019-01-18 | Disposition: A | Discharge status: Home / Self Care | Payer: Medicare Other | Source: Ambulatory Visit | Attending: Family | Admitting: Family

## 2019-01-18 DIAGNOSIS — K769 Liver disease, unspecified: Secondary | ICD-10-CM | POA: Diagnosis not present

## 2019-01-18 DIAGNOSIS — K7689 Other specified diseases of liver: Secondary | ICD-10-CM | POA: Diagnosis not present

## 2019-01-18 IMAGING — MR MR ABDOMEN WO/W CM
17 series · 46 of 48 positions shown · IV contrast (gadavist)
Comparison: [DATE]

CLINICAL DATA: Evaluate liver lesion.

EXAM:
MRI ABDOMEN WITHOUT AND WITH CONTRAST
TECHNIQUE: Multiplanar multisequence MR imaging of the abdomen was performed
both before and after the administration of intravenous contrast.
CONTRAST:  4 cc of Gadavist.

[Series 2: T2 · coronal · 5.0mm · 1.12mm/px · 2 of 30 slices shown (1 of 2)]
[im 1/30]
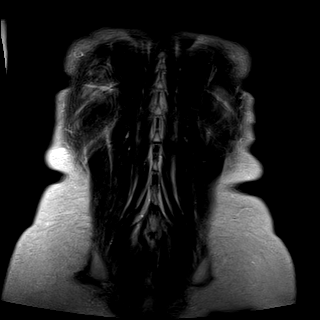
[im 30/30]
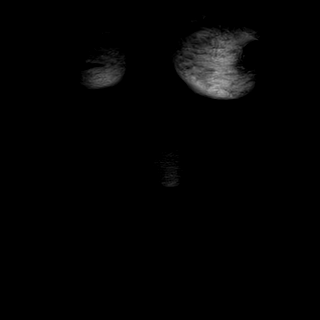

[Series 3: T2 · axial · 6.0mm · 1.12mm/px · z∈[-151,+101]mm · 2 of 36 slices shown (2 of 2)]
[im 1/36]
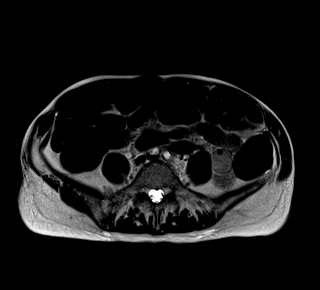
[im 36/36]
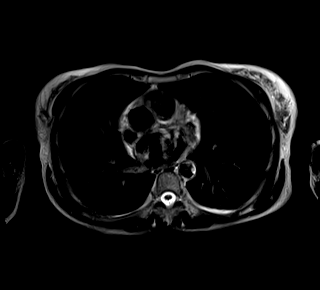

[Series 5: T2 fat-sat · axial · 6.0mm · 1.12mm/px · z∈[-151,+101]mm · 2 of 36 slices shown]
[im 1/36]
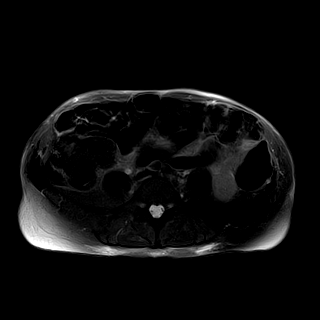
[im 36/36]
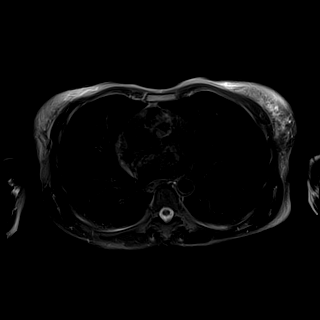

[Series 6: ax dwi_tracew · axial · 6.0mm · 1.34mm/px · z∈[-151,+101]mm · 5 of 108 slices shown]
[im 1/108]
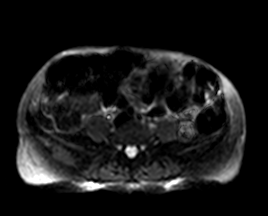
[im 27/108]
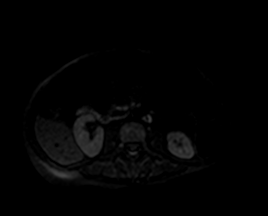
[im 54/108]
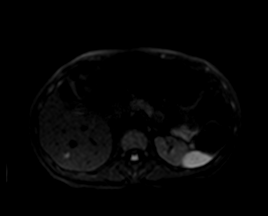
[im 81/108]
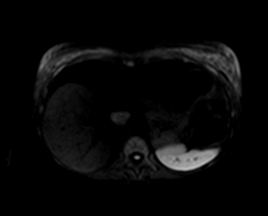
[im 108/108]
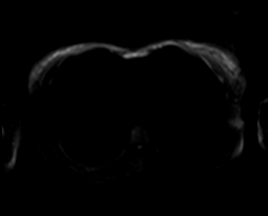

[Series 7: ax dwi_adc · axial · 6.0mm · 1.34mm/px · z∈[-151,+101]mm · 2 of 36 slices shown]
[im 1/36]
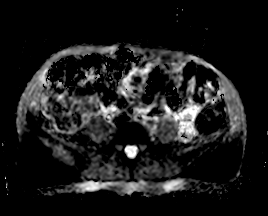
[im 36/36]
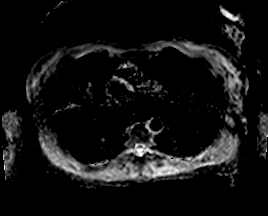

[Series 8: T1 · axial · 6.0mm · 0.70mm/px · z∈[-151,+101]mm · 3 of 72 slices shown]
[im 1/72]
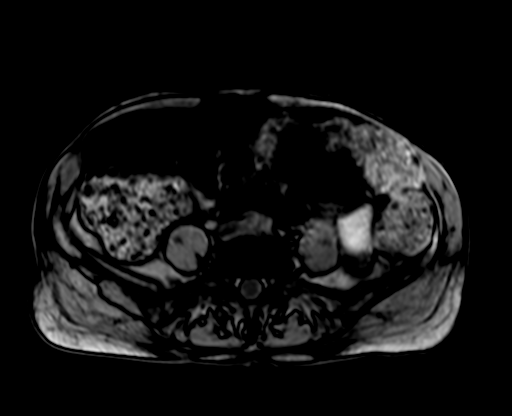
[im 36/72]
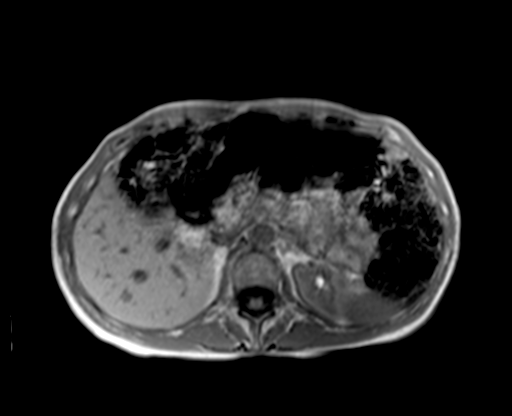
[im 72/72]
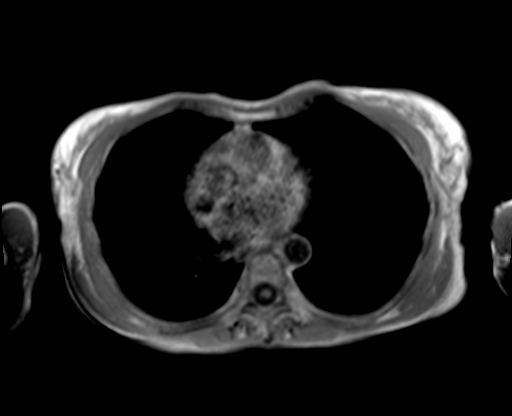

[Series 9: bSSFP · axial · 6.0mm · 0.70mm/px · z∈[-151,+101]mm · 2 of 36 slices shown]
[im 1/36]
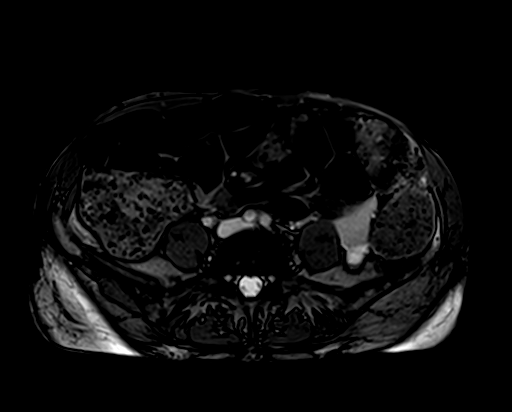
[im 36/36]
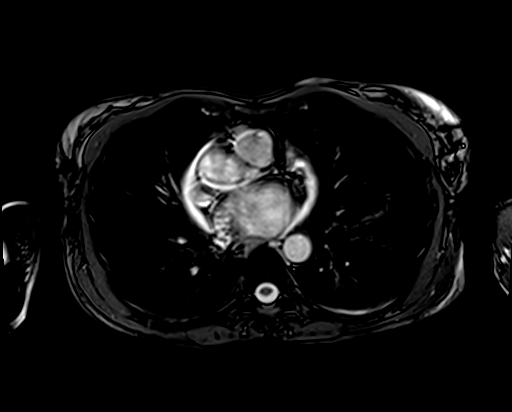

[Series 10: T1 dynamic fat-sat · axial · non-contrast · 3.0mm · 1.12mm/px · z∈[-143,+94]mm · 3 of 80 slices shown (1 of 5)]
[im 1/80]
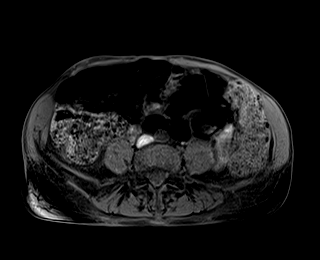
[im 40/80]
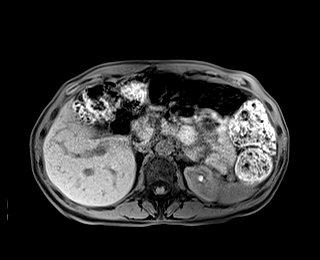
[im 80/80]
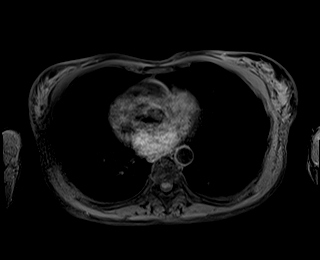

[Series 11: T1 dynamic fat-sat post-contrast · axial · 3.0mm · 1.12mm/px · z∈[-143,+94]mm · 3 of 80 slices shown (1 of 4)]
[im 1/80]
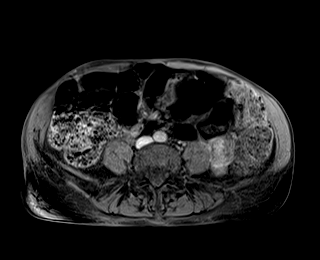
[im 40/80]
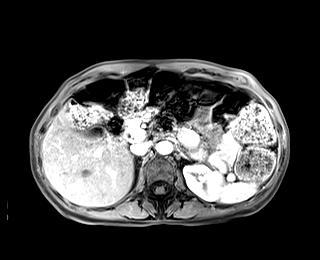
[im 80/80]
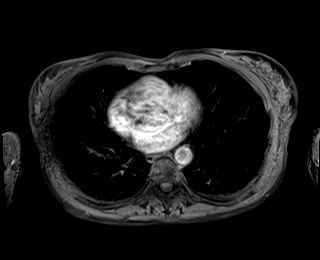

[Series 12: T1 dynamic fat-sat · axial · 3.0mm · 1.12mm/px · z∈[-143,+94]mm · 3 of 80 slices shown (2 of 5)]
[im 1/80]
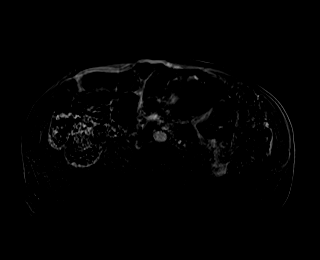
[im 40/80]
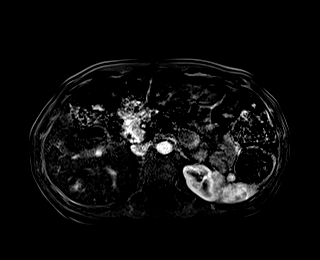
[im 80/80]
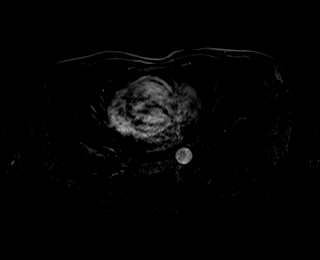

[Series 13: T1 dynamic fat-sat post-contrast · axial · 3.0mm · 1.12mm/px · z∈[-143,+94]mm · 3 of 80 slices shown (2 of 4)]
[im 1/80]
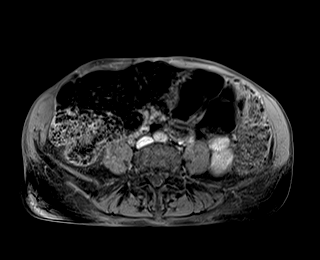
[im 40/80]
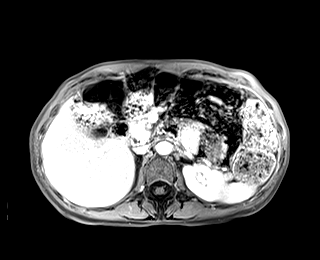
[im 80/80]
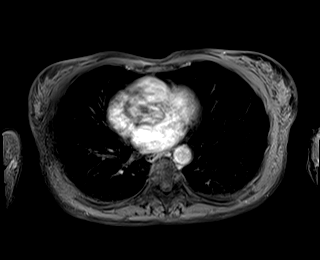

[Series 14: T1 dynamic fat-sat · axial · 3.0mm · 1.12mm/px · z∈[-143,+94]mm · 3 of 80 slices shown (3 of 5)]
[im 1/80]
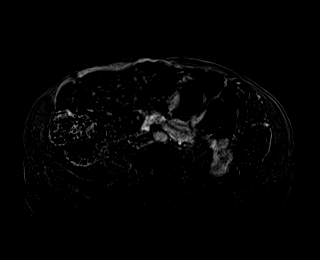
[im 40/80]
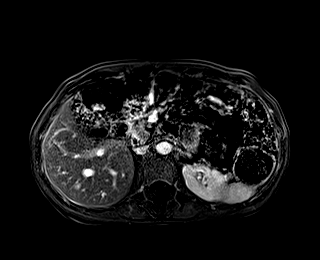
[im 80/80]
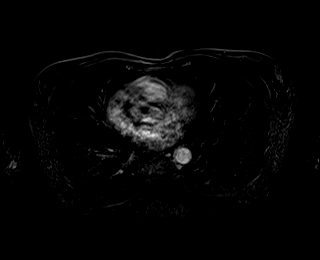

[Series 15: T1 dynamic fat-sat post-contrast · axial · 3.0mm · 1.12mm/px · z∈[-143,+94]mm · 3 of 80 slices shown (3 of 4)]
[im 1/80]
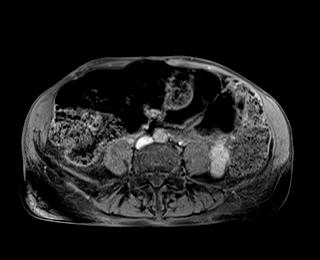
[im 40/80]
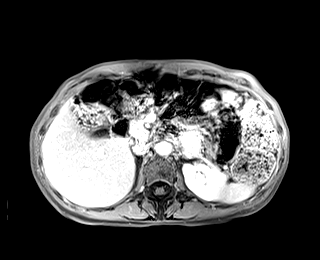
[im 80/80]
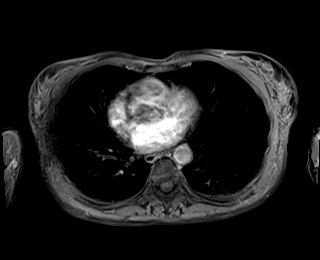

[Series 16: T1 dynamic fat-sat · axial · 3.0mm · 1.12mm/px · z∈[-143,+94]mm · 3 of 80 slices shown (4 of 5)]
[im 1/80]
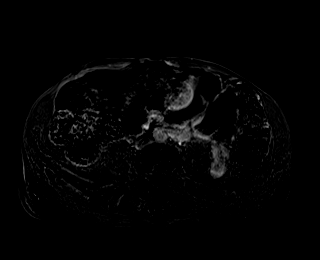
[im 40/80]
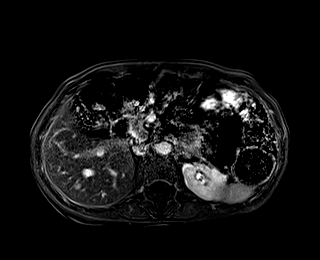
[im 80/80]
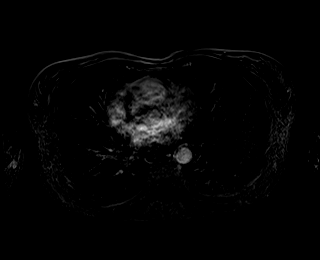

[Series 17: T1 dynamic post-contrast · coronal · 3.5mm · 1.31mm/px · 3 of 60 slices shown]
[im 1/60]
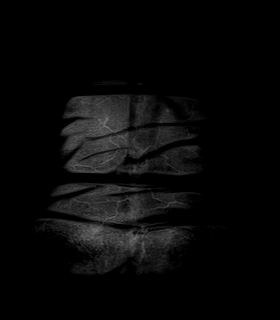
[im 30/60]
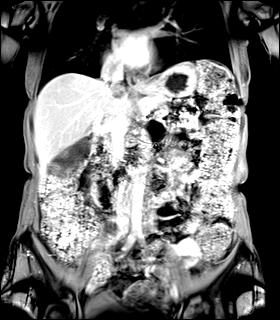
[im 60/60]
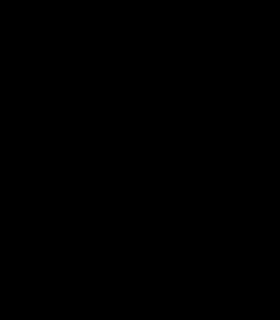

[Series 18: T1 dynamic fat-sat post-contrast · axial · 3.0mm · 1.12mm/px · z∈[-143,+94]mm · 3 of 80 slices shown (4 of 4)]
[im 1/80]
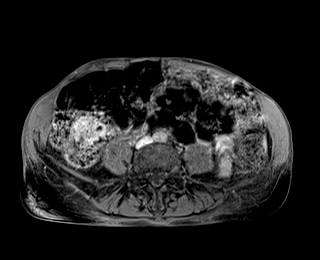
[im 40/80]
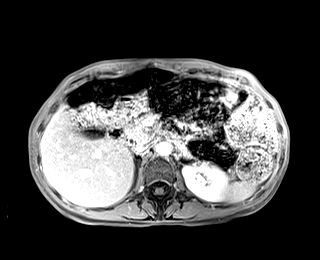
[im 80/80]
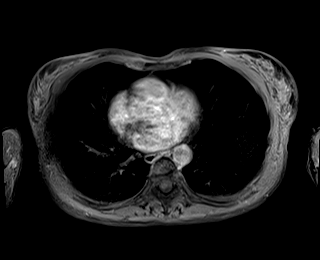

[Series 19: T1 dynamic fat-sat · axial · 3.0mm · 1.12mm/px · 1 of 80 slices shown (5 of 5)]
[im 1/80]
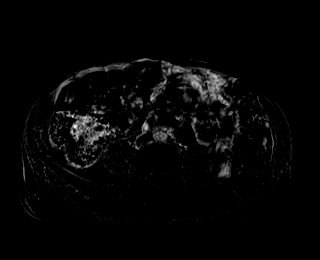

[46 of 48 positions shown; findings below may reference images not displayed]

FINDINGS: Lower chest: No acute findings.

Hepatobiliary: There are several T1 hypointense and T2 hyperintense
liver lesions corresponding to recent CT abnormality. The largest is
in segment 4a abutting the anterior surface of the IVC. This
measures 2.3 cm. These all exhibit early peripheral nodular
enhancement with progressive fill-in on the delayed images
compatible with benign hemangiomas. Within the posterior and lateral
right lobe of liver there is an 8 mm T1 hypointense and T2
hyperintense lesion with enhancement characteristics of a flash fill
hemangioma, image [DATE]. Smaller flash fill hemangioma noted within
the lateral right lobe of liver measuring 0.7 cm, image [DATE]. No
suspicious liver abnormalities. Gallbladder normal. No biliary
dilatation.

Pancreas: No mass, inflammatory changes, or other parenchymal
abnormality identified.

Spleen:  Within normal limits in size and appearance.

Adrenals/Urinary Tract:  Normal appearance of the adrenal glands.

No hydronephrosis identified bilaterally. Several subcentimeter
bilateral T2 hyperintense, T1 hypointense kidney lesions are
identified. Technically too small to reliably characterize but
favored to represent simple cysts. No enhancing mass or
hydronephrosis identified.

Stomach/Bowel: The stomach appears normal. Again noted is diffuse
distension of the small bowel loops which measure up to 3.6 cm. A
very large stool burden is noted throughout the colon up to the
level of the rectum.

Vascular/Lymphatic: No pathologically enlarged lymph nodes
identified. No abdominal aortic aneurysm demonstrated.

Other:  No free fluid or fluid collections identified.

Musculoskeletal: No suspicious bone lesions identified.
IMPRESSION: 1. The previously noted liver lesions correspond to multiple benign
hemangiomas.
2. There is a very large stool burden noted throughout the colon
with diffuse gaseous distension of the small bowel loops.
Correlation for any clinical signs or symptoms of
constipation/impaction.

## 2019-01-18 MED ORDER — GADOBUTROL 1 MMOL/ML IV SOLN
4.0000 mL | Freq: Once | INTRAVENOUS | Status: AC | PRN
  Administered 2019-01-18: 4 mL via INTRAVENOUS

## 2019-01-20 ENCOUNTER — Encounter: Payer: Self-pay | Admitting: Family

## 2019-02-15 ENCOUNTER — Encounter: Payer: Self-pay | Admitting: Family

## 2019-02-18 ENCOUNTER — Ambulatory Visit: Payer: Medicare Other | Admitting: General Surgery

## 2019-03-18 ENCOUNTER — Ambulatory Visit (INDEPENDENT_AMBULATORY_CARE_PROVIDER_SITE_OTHER): Payer: Medicare Other

## 2019-03-18 ENCOUNTER — Ambulatory Visit (INDEPENDENT_AMBULATORY_CARE_PROVIDER_SITE_OTHER): Payer: Medicare Other | Admitting: General Surgery

## 2019-03-18 ENCOUNTER — Other Ambulatory Visit: Payer: Self-pay

## 2019-03-18 ENCOUNTER — Encounter: Payer: Self-pay | Admitting: General Surgery

## 2019-03-18 VITALS — BP 143/88 | HR 73 | Temp 97.7°F | Resp 15 | Ht 62.0 in | Wt 109.6 lb

## 2019-03-18 DIAGNOSIS — E041 Nontoxic single thyroid nodule: Secondary | ICD-10-CM

## 2019-03-18 NOTE — Progress Notes (Signed)
Patient ID: Erica Maldonado, female   DOB: 1951-10-18, 68 y.o.   MRN: [id number]  Chief Complaint  Patient presents with  . Follow-up    Thyroid     HPI Erica Maldonado is a 68 y.o. female here today for repeat thyroid ultrasound to reassess a previously identified nodule within the thyroid isthmus.  FNA completed in spring 2019 had showed [place] II changes.  The patient is unaware of any problems with her neck, no dysphagia, no vocal changes.    She is also wanting to have her umbilical hernia looked at. She reports some burning with exercise at times. She reports that she has had trouble with constipation that started in July.  The question came up of whether she should have an early colonoscopy.  The patient has not noticed any blood or mucus in her stools.  She is made use of MiraLAX on an every other day basis as well as daily Colace, with stools usually occurring on the days when she makes use of MiraLAX.  She reports no dietary change.  The decreasing stool function has occurred gradually over the last 12 to 24 months.  No unusual weight loss.   HPI  Past Medical History:  Diagnosis Date  . Abdominal or pelvic swelling, mass, or lump, left lower quadrant   . Epistaxis    s/p cauterization  . Fibroadenoma    right breast  . Hemorrhoids, external, without mention of complication   . Infectious diarrhea(009.2)    history of  . Other bursitis disorders   . Other chest pain   . Scleroderma ([place])   . Sleep disturbance, unspecified   . Umbilical hernia without mention of obstruction or gangrene     Past Surgical History:  Procedure Laterality Date  . BREAST BIOPSY Right 1988   fibroadenoma removed,neg  . BREAST BIOPSY Right 1995   Dr Bary Castilla, neg  . COLONOSCOPY  2001, 2011   Dr Vira Agar  . DILATION AND CURETTAGE OF UTERUS  2001   Dr Laurey Morale  . HYSTEROSCOPY  1995   normal/ Dr Laurey Morale    Family History  Problem Relation Age of Onset  . Breast cancer Mother 7  . Cancer  Mother        breast, developed uterine cancer while on tamoxifen  . Cancer Brother        melanoma/scalp  . Goiter Sister        Twin    Social History Social History   Tobacco Use  . Smoking status: Never Smoker  . Smokeless tobacco: Never Used  . Tobacco comment: Remote history  Substance Use Topics  . Alcohol use: Yes    Comment: daily red wine  . Drug use: No    Allergies  Allergen Reactions  . Amitiza [Lubiprostone] Shortness Of Breath  . Naprosyn [Naproxen] Swelling    Throat felt like it was closing  . Biaxin [Clarithromycin] Other (See Comments)    Tongue blisters  . Clarithromycin Rash    Blisters on tongue    Current Outpatient Medications  Medication Sig Dispense Refill  . CALCIUM-MAGNESIUM-VITAMIN D PO Take 1 capsule by mouth 2 (two) times daily.    . cholecalciferol (VITAMIN D) 1000 UNITS tablet Take 2,000 Units by mouth daily.    Marland Kitchen docusate sodium (COLACE) 100 MG capsule Take 300 mg by mouth daily.    Marland Kitchen estradiol (ESTRACE VAGINAL) 0.1 MG/GM vaginal cream Place 1 Applicatorful vaginally 3 (three) times a week. 42.5 g 12  . Magnesium  Ascorbate POWD 125 mg daily by Does not apply route.    . Nutritional Supplements (NUTRITIONAL SUPPLEMENT PO) Take 1 Dose by mouth daily as needed.    . Omega-3 Fatty Acids (FISH OIL) 1000 MG CAPS Take 1 capsule by mouth.    . polyethylene glycol (MIRALAX / GLYCOLAX) 17 g packet Take 17 g by mouth. 1 cap 3 days a week    . PREBIOTIC PRODUCT PO Take 750 mg by mouth daily.     No current facility-administered medications for this visit.     Review of Systems Review of Systems  Constitutional: Negative.   Respiratory: Negative.   Cardiovascular: Negative.     Blood pressure (!) 143/88, pulse 73, temperature 97.7 F (36.5 C), resp. rate 15, height 5\' 2"  (1.575 m), weight 109 lb 9.6 oz (49.7 kg), SpO2 99 %. The patient's weight in March 2019 was 212 pounds.  Physical Exam Physical Exam Constitutional:      Appearance:  She is well-developed.  Eyes:     General: No scleral icterus.    Conjunctiva/sclera: Conjunctivae normal.  Neck:     Musculoskeletal: Neck supple.   Cardiovascular:     Rate and Rhythm: Normal rate and regular rhythm.     Heart sounds: Normal heart sounds.  Pulmonary:     Effort: Pulmonary effort is normal.     Breath sounds: Normal breath sounds.  Abdominal:     Palpations: Abdomen is soft.       Comments:     Lymphadenopathy:     Cervical: No cervical adenopathy.     Upper Body:     Right upper body: No supraclavicular adenopathy.     Left upper body: No supraclavicular adenopathy.  Skin:    General: Skin is warm and dry.  Neurological:     Mental Status: She is alert and oriented to person, place, and time.     Data Reviewed At the time of the patient's March 19, 2018 exam the lesion in the isthmus measured 0.5 x 1.1 x 1.4 cm.  FNA sampling showed a benign follicular nodule.  Ultrasound examination of the thyroid was completed today.  Scanning through the isthmus shows no evidence of the previously well identified nodule.  There is a stable solid nodule in the upper pole of the right lobe measuring 0.86 x 0.87 x 1.05.  Peripheral flow again noted.  No significant interval change, previously measuring 0.86 x 0.9 x 0.98 cm.  The overall dimensions of the right lobe are 0.86 x 1.26 x 3.08.  Overall dimensions of the left lobe are 0.6 x 1.2 x 2.45 cm.    Assessment Stable nodule in the upper pole of the right lobe.  Resolution of the previously noted benign follicular nodule in the thyroid isthmus.  Asymptomatic umbilical hernia with mild associated diastases recti.  Plan   Additional imaging of the thyroid will not be required unless the patient appreciates a new nodule in the neck or experiences dysphasia.  HPI, Physical Exam, Assessment and Plan have been scribed under the direction and in the presence of Robert Bellow, MD  Concepcion Living, LPN  I have  completed the exam and reviewed the above documentation for accuracy and completeness.  I agree with the above.  Haematologist has been used and any errors in dictation or transcription are unintentional.  Hervey Ard, M.D., F.A.C.S.  Forest Gleason Emiliana Blaize 03/19/2019, 8:37 PM

## 2019-03-18 NOTE — Patient Instructions (Signed)
Follow up as needed

## 2019-04-22 DIAGNOSIS — R040 Epistaxis: Secondary | ICD-10-CM | POA: Diagnosis not present

## 2019-04-22 DIAGNOSIS — J340 Abscess, furuncle and carbuncle of nose: Secondary | ICD-10-CM | POA: Diagnosis not present

## 2019-04-28 DIAGNOSIS — R040 Epistaxis: Secondary | ICD-10-CM | POA: Diagnosis not present

## 2019-07-16 DIAGNOSIS — D235 Other benign neoplasm of skin of trunk: Secondary | ICD-10-CM | POA: Diagnosis not present

## 2019-07-16 DIAGNOSIS — L538 Other specified erythematous conditions: Secondary | ICD-10-CM | POA: Diagnosis not present

## 2019-07-16 DIAGNOSIS — D485 Neoplasm of uncertain behavior of skin: Secondary | ICD-10-CM | POA: Diagnosis not present

## 2019-08-18 ENCOUNTER — Other Ambulatory Visit: Payer: Self-pay | Admitting: Internal Medicine

## 2019-08-18 DIAGNOSIS — Z1231 Encounter for screening mammogram for malignant neoplasm of breast: Secondary | ICD-10-CM

## 2019-08-26 ENCOUNTER — Other Ambulatory Visit: Payer: Self-pay

## 2019-08-26 MED ORDER — ESTRADIOL 0.1 MG/GM VA CREA: 1.0000 | TOPICAL_CREAM | VAGINAL | 12 refills | Status: DC

## 2019-10-02 ENCOUNTER — Ambulatory Visit
Admission: RE | Admit: 2019-10-02 | Discharge: 2019-10-02 | Disposition: A | Discharge status: Home / Self Care | Payer: Medicare Other | Source: Ambulatory Visit | Attending: Internal Medicine | Admitting: Internal Medicine

## 2019-10-02 DIAGNOSIS — Z1231 Encounter for screening mammogram for malignant neoplasm of breast: Secondary | ICD-10-CM | POA: Diagnosis not present

## 2019-10-02 IMAGING — MG DIGITAL SCREENING BILAT W/ TOMO W/ CAD
8 series · 9 of 24 positions shown · non-contrast
Comparison: Previous exam(s).

CLINICAL DATA: Screening.

EXAM:
DIGITAL SCREENING BILATERAL MAMMOGRAM WITH TOMO AND CAD

[R MLO synth-2D]
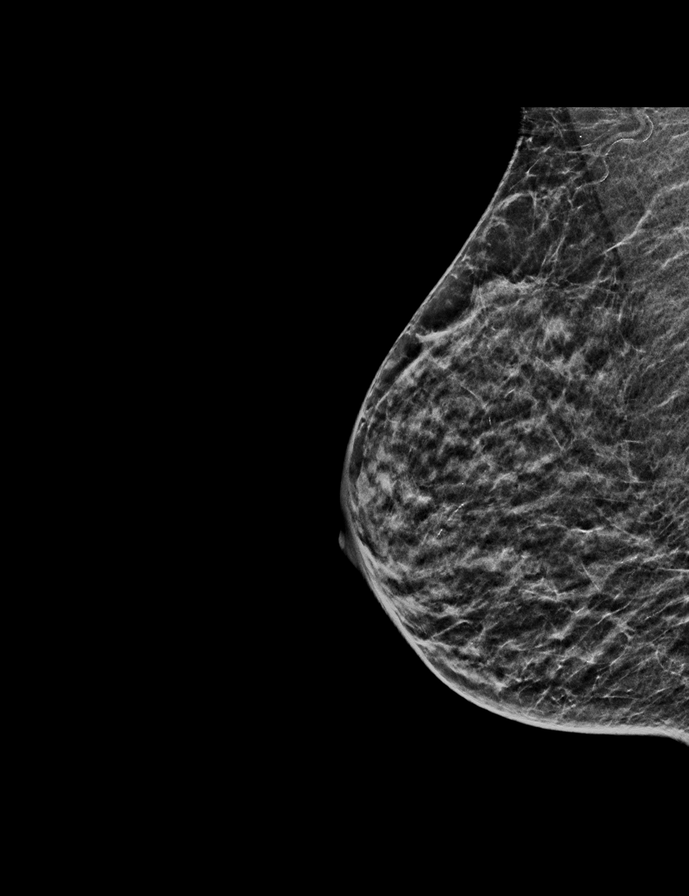

[R CC synth-2D]
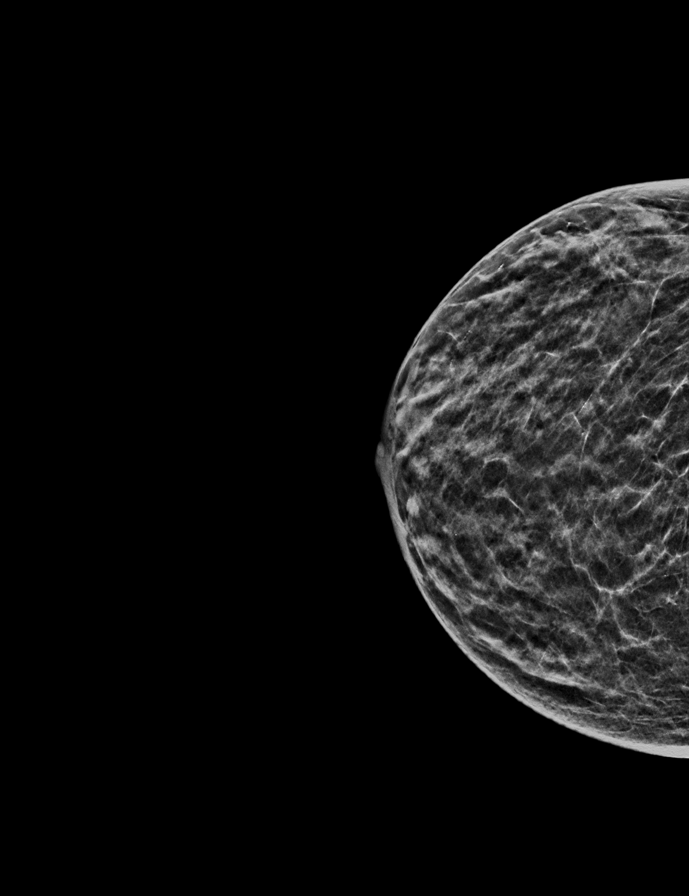

[L MLO synth-2D]
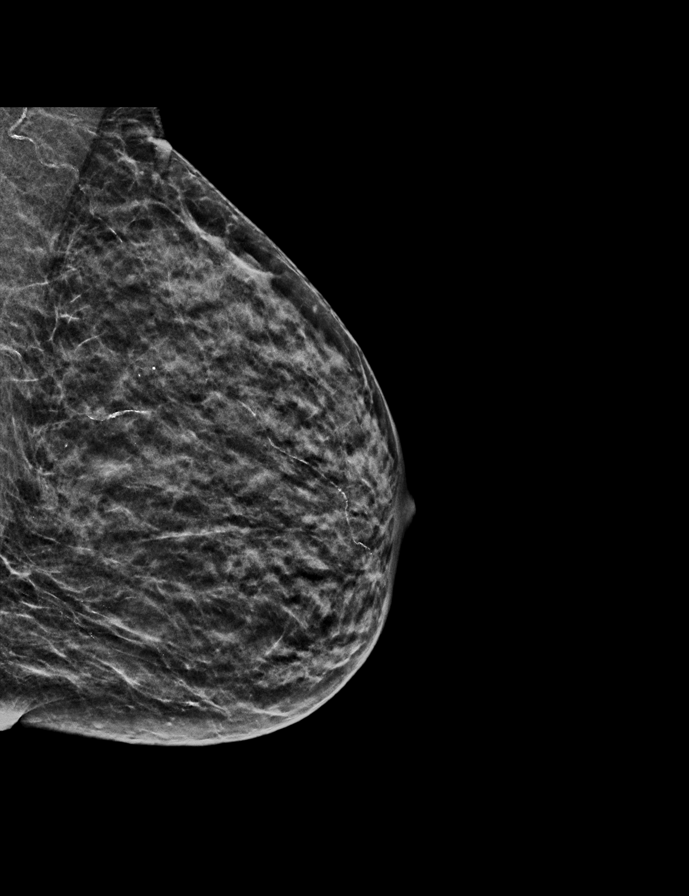

[L CC synth-2D]
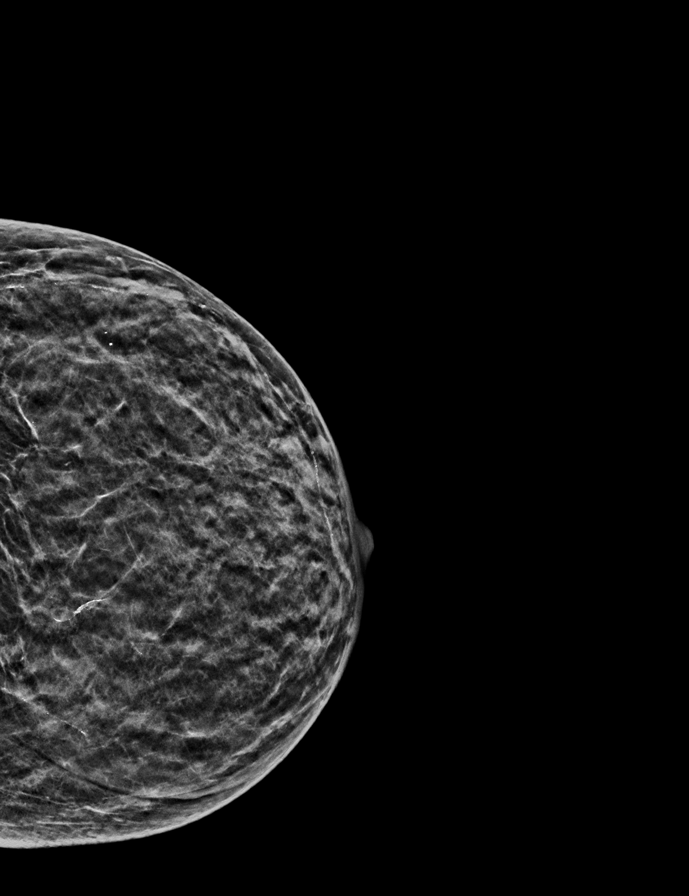

[L MLO tomo · 2 of 36 frames shown]
[frame 12/36]
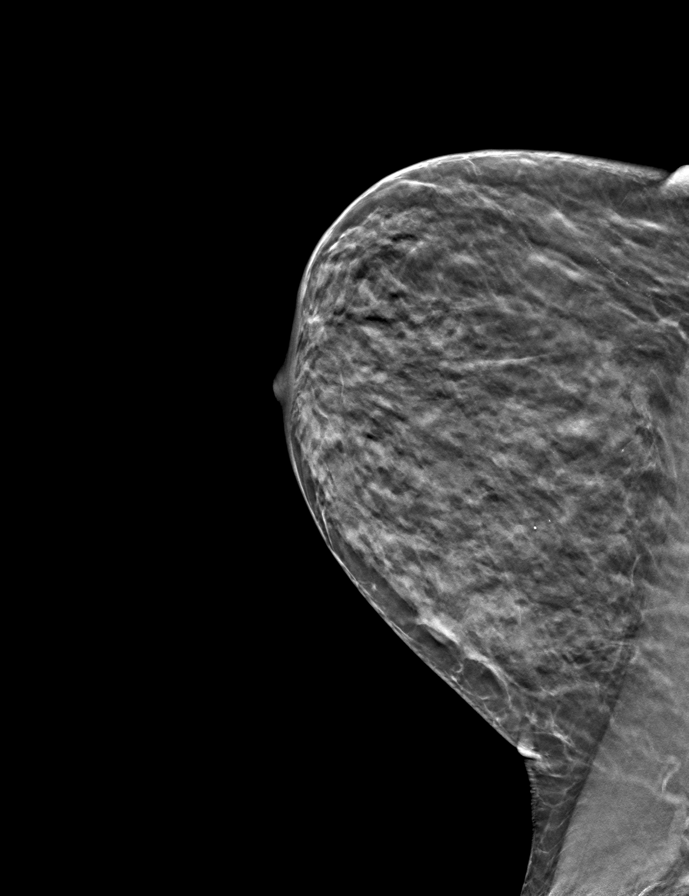
[frame 19/36]
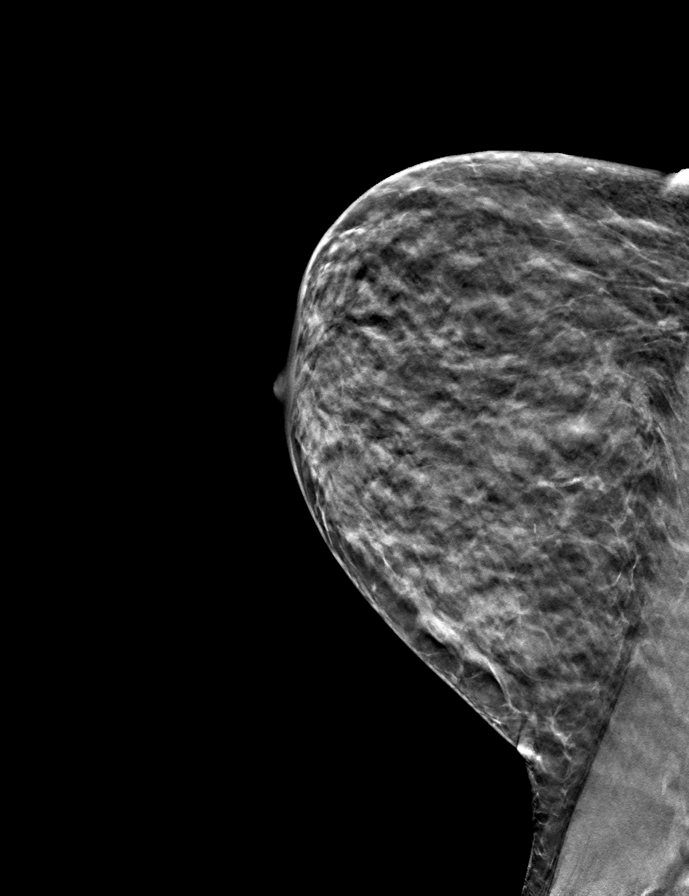

[L CC tomo · tomo slice 17/32.0]
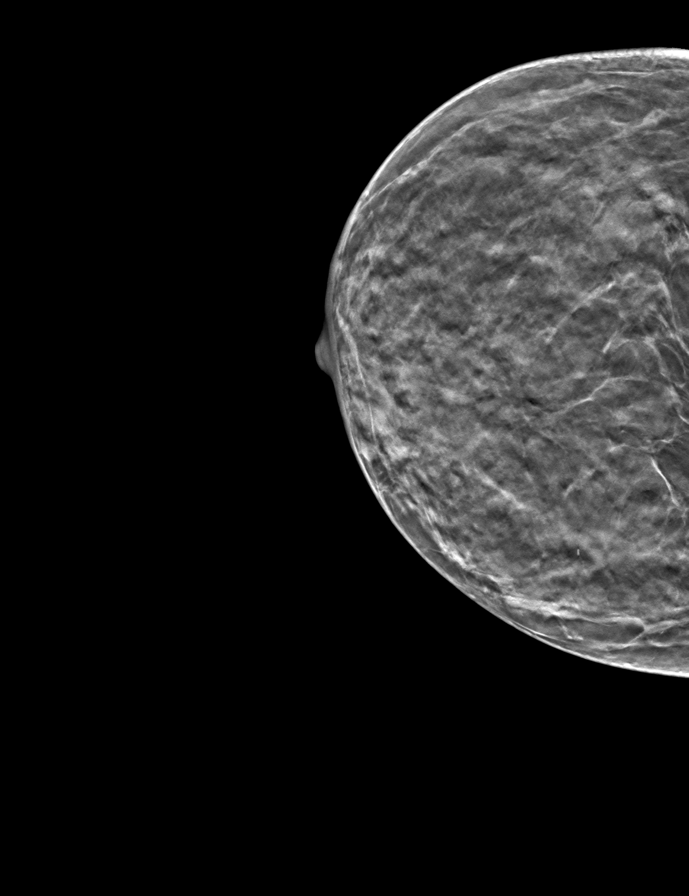

[R MLO tomo · tomo slice 17/32.0]
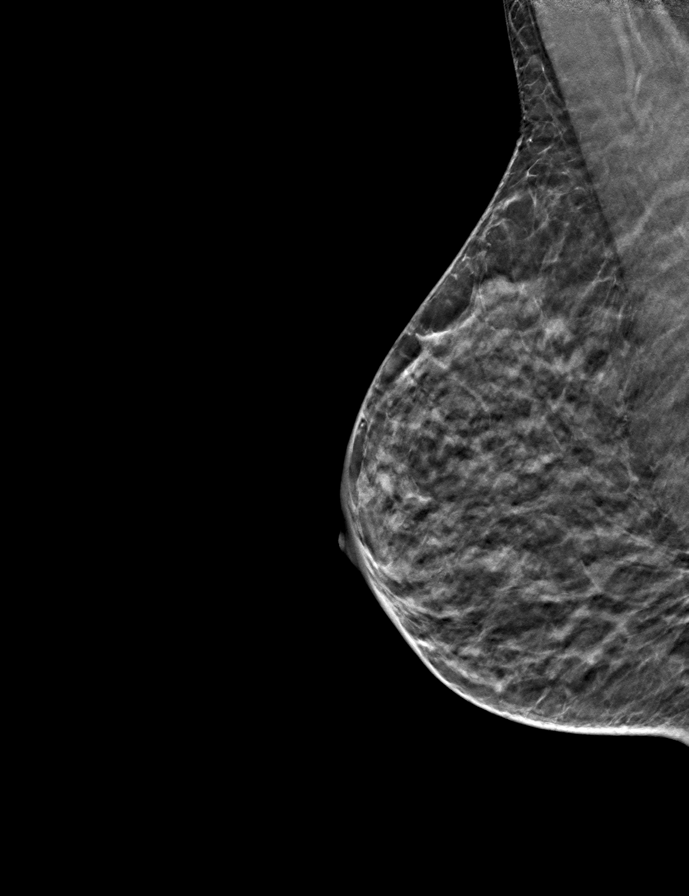

[R CC tomo · tomo slice 17/34.0]
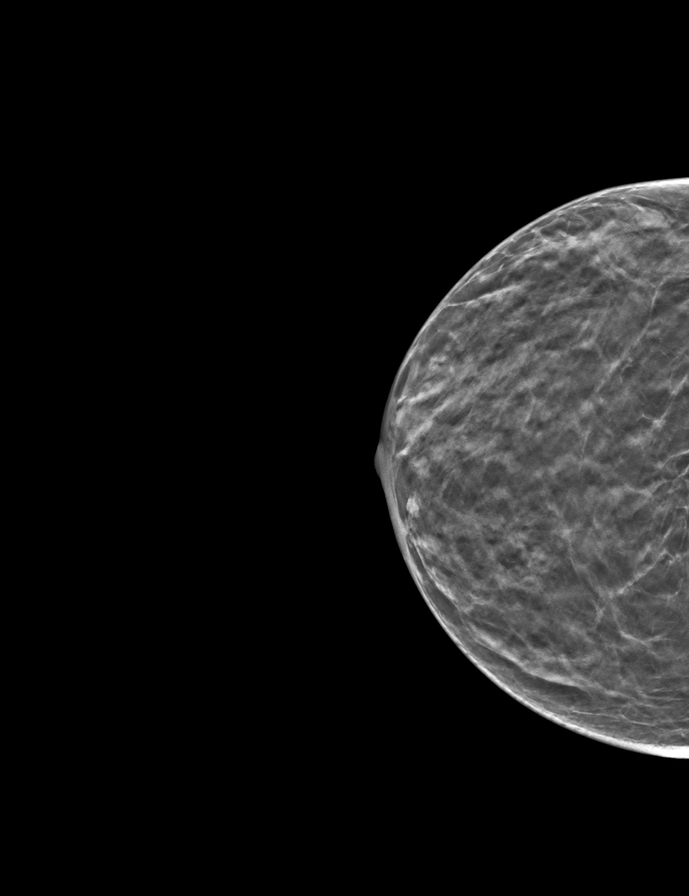

[9 of 24 positions shown; findings below may reference images not displayed]

ACR Breast Density Category c: The breast tissue is heterogeneously
dense, which may obscure small masses.
FINDINGS: There are no findings suspicious for malignancy. Images were
processed with CAD.
IMPRESSION: No mammographic evidence of malignancy. A result letter of this
screening mammogram will be mailed directly to the patient.

RECOMMENDATION:
Screening mammogram in one year. (Code:[5V])

BI-RADS CATEGORY  1: Negative.

## 2019-10-10 ENCOUNTER — Other Ambulatory Visit: Payer: Self-pay

## 2019-10-13 ENCOUNTER — Ambulatory Visit (INDEPENDENT_AMBULATORY_CARE_PROVIDER_SITE_OTHER): Payer: Medicare Other

## 2019-10-13 ENCOUNTER — Other Ambulatory Visit: Payer: Self-pay

## 2019-10-13 ENCOUNTER — Encounter: Payer: Self-pay | Admitting: Internal Medicine

## 2019-10-13 ENCOUNTER — Ambulatory Visit (INDEPENDENT_AMBULATORY_CARE_PROVIDER_SITE_OTHER): Payer: Medicare Other | Admitting: Internal Medicine

## 2019-10-13 VITALS — BP 112/78 | HR 80 | Temp 96.6°F | Resp 15 | Ht 62.0 in | Wt 110.0 lb

## 2019-10-13 DIAGNOSIS — G629 Polyneuropathy, unspecified: Secondary | ICD-10-CM

## 2019-10-13 DIAGNOSIS — K5904 Chronic idiopathic constipation: Secondary | ICD-10-CM

## 2019-10-13 DIAGNOSIS — E785 Hyperlipidemia, unspecified: Secondary | ICD-10-CM

## 2019-10-13 DIAGNOSIS — Z780 Asymptomatic menopausal state: Secondary | ICD-10-CM

## 2019-10-13 DIAGNOSIS — R140 Abdominal distension (gaseous): Secondary | ICD-10-CM | POA: Diagnosis not present

## 2019-10-13 DIAGNOSIS — E538 Deficiency of other specified B group vitamins: Secondary | ICD-10-CM

## 2019-10-13 DIAGNOSIS — Z0000 Encounter for general adult medical examination without abnormal findings: Secondary | ICD-10-CM | POA: Diagnosis not present

## 2019-10-13 DIAGNOSIS — Z1231 Encounter for screening mammogram for malignant neoplasm of breast: Secondary | ICD-10-CM | POA: Diagnosis not present

## 2019-10-13 DIAGNOSIS — R7301 Impaired fasting glucose: Secondary | ICD-10-CM | POA: Diagnosis not present

## 2019-10-13 DIAGNOSIS — R5383 Other fatigue: Secondary | ICD-10-CM

## 2019-10-13 MED ORDER — ZOSTER VAC RECOMB ADJUVANTED 50 MCG/0.5ML IM SUSR: 0.5000 mL | Freq: Once | INTRAMUSCULAR | 1 refills | Status: AC

## 2019-10-13 NOTE — Patient Instructions (Addendum)
  Erica Maldonado , Thank you for taking time to come for your Medicare Wellness Visit. I appreciate your ongoing commitment to your health goals. Please review the following plan we discussed and let me know if I can assist you in the future.   These are the goals we discussed: Goals    . Maintain healthy lifestyle       This is a list of the screening recommended for you and due dates:  Health Maintenance  Topic Date Due  . Pneumonia vaccines (1 of 2 - PCV13) 10/12/2020*  . Mammogram  10/01/2021  . Colon Cancer Screening  12/04/2021  . Tetanus Vaccine  10/05/2027  . Flu Shot  Completed  . DEXA scan (bone density measurement)  Completed  .  Hepatitis C: One time screening is recommended by Center for Disease Control  (CDC) for  adults born from [birth year] through 1966.   Completed  *Topic was postponed. The date shown is not the original due date.

## 2019-10-13 NOTE — Patient Instructions (Addendum)
I  HAVE ORDERED YOUR BONE DENSITY TEST AGAIN   The ShingRx vaccine is now available in local pharmacies and is much more protective than the old one  Zostavax  (it is about 97%  Effective in preventing shingles). .   It is therefore ADVISED for all interested adults over 50 to prevent shingles so I have printed you a prescription for it.  (it requires a 2nd dose 2 to 6 months after the first one) .  It will cause you to have flu  like symptoms for 2 days If your pharmacy is not available to give it to you,  The [facility] pharmacy will give it to you.  They are located on the ground floor of the [place] [facility] After Age 66 After age 42, you are at a higher risk for certain long-term diseases and infections as well as injuries from falls. Falls are a major cause of broken bones and head injuries in people who are older than age 57. Getting regular preventive care can help to keep you healthy and well. Preventive care includes getting regular testing and making lifestyle changes as recommended by your health care provider. Talk with your health care provider about:  Which screenings and tests you should have. A screening is a test that checks for a disease when you have no symptoms.  A diet and exercise plan that is right for you. What should I know about screenings and tests to prevent falls? Screening and testing are the best ways to find a health problem early. Early diagnosis and treatment give you the best chance of managing medical conditions that are common after age 66. Certain conditions and lifestyle choices may make you more likely to have a fall. Your health care provider may recommend:  Regular vision checks. Poor vision and conditions such as cataracts can make you more likely to have a fall. If you wear glasses, make sure to get your prescription updated if your vision changes.  Medicine review. Work with your health care provider to regularly  review all of the medicines you are taking, including over-the-counter medicines. Ask your health care provider about any side effects that may make you more likely to have a fall. Tell your health care provider if any medicines that you take make you feel dizzy or sleepy.  Osteoporosis screening. Osteoporosis is a condition that causes the bones to get weaker. This can make the bones weak and cause them to break more easily.  Blood pressure screening. Blood pressure changes and medicines to control blood pressure can make you feel dizzy.  Strength and balance checks. Your health care provider may recommend certain tests to check your strength and balance while standing, walking, or changing positions.  Foot health exam. Foot pain and numbness, as well as not wearing proper footwear, can make you more likely to have a fall.  Depression screening. You may be more likely to have a fall if you have a fear of falling, feel emotionally low, or feel unable to do activities that you used to do.  Alcohol use screening. Using too much alcohol can affect your balance and may make you more likely to have a fall. What actions can I take to lower my risk of falls? General instructions  Talk with your health care provider about your risks for falling. Tell your health care provider if: ? You fall. Be sure to tell your health care provider about all falls, even  ones that seem minor. ? You feel dizzy, sleepy, or off-balance.  Take over-the-counter and prescription medicines only as told by your health care provider. These include any supplements.  Eat a healthy diet and maintain a healthy weight. A healthy diet includes low-fat dairy products, low-fat (lean) meats, and fiber from whole grains, beans, and lots of fruits and vegetables. Home safety  Remove any tripping hazards, such as rugs, cords, and clutter.  Install safety equipment such as grab bars in bathrooms and safety rails on stairs.  Keep  rooms and walkways well-lit. Activity   Follow a regular exercise program to stay fit. This will help you maintain your balance. Ask your health care provider what types of exercise are appropriate for you.  If you need a cane or walker, use it as recommended by your health care provider.  Wear supportive shoes that have nonskid soles. Lifestyle  Do not drink alcohol if your health care provider tells you not to drink.  If you drink alcohol, limit how much you have: ? 0-1 drink a day for women. ? 0-2 drinks a day for men.  Be aware of how much alcohol is in your drink. In the [place] one drink equals one typical bottle of beer (12 oz), one-half glass of wine (5 oz), or one shot of hard liquor (1 oz).  Do not use any products that contain nicotine or tobacco, such as cigarettes and e-cigarettes. If you need help quitting, ask your health care provider. Summary  Having a healthy lifestyle and getting preventive care can help to protect your health and wellness after age 40.  Screening and testing are the best way to find a health problem early and help you avoid having a fall. Early diagnosis and treatment give you the best chance for managing medical conditions that are more common for people who are older than age 27.  Falls are a major cause of broken bones and head injuries in people who are older than age 70. Take precautions to prevent a fall at home.  Work with your health care provider to learn what changes you can make to improve your health and wellness and to prevent falls. This information is not intended to replace advice given to you by your health care provider. Make sure you discuss any questions you have with your health care provider. Document Released: 09/19/2017 Document Revised: 02/27/2019 Document Reviewed: 09/19/2017 Elsevier Patient Education  2020 [employer].

## 2019-10-13 NOTE — Progress Notes (Addendum)
Patient ID: Erica Maldonado, female    DOB: 1951/06/26  Age: 68 y.o. MRN: [id number]  The patient is here for follow up  examination and management of other chronic and acute problems.  This visit occurred during the SARS-CoV-2 public health emergency.  Safety protocols were in place, including screening questions prior to the visit, additional usage of staff PPE, and extensive cleaning of exam room while observing appropriate contact time as indicated for disinfecting solutions.     The risk factors are reflected in the social history.  The roster of all physicians providing medical care to patient - is listed in the Snapshot section of the chart.  Activities of daily living:  The patient is 100% independent in all ADLs: dressing, toileting, feeding as well as independent mobility  Home safety : The patient has smoke detectors in the home. They wear seatbelts.  There are no firearms at home. There is no violence in the home.   There is no risks for hepatitis, STDs or HIV. There is no   history of blood transfusion. They have no travel history to infectious disease endemic areas of the world.  The patient has seen their dentist in the last six month. They have seen their eye doctor in the last year. They admit to slight hearing difficulty with regard to whispered voices and some television programs.  They have deferred audiologic testing in the last year.  They do not  have excessive sun exposure. Discussed the need for sun protection: hats, long sleeves and use of sunscreen if there is significant sun exposure.   Diet: the importance of a healthy diet is discussed. They do have a healthy diet.  The benefits of regular aerobic exercise were discussed. She walks 4 times per week ,  20 minutes.   Depression screen: there are no signs or vegative symptoms of depression- irritability, change in appetite, anhedonia, sadness/tearfullness.  Cognitive assessment: the patient manages all their financial  and personal affairs and is actively engaged. They could relate day,date,year and events; recalled 2/3 objects at 3 minutes; performed clock-face test normally.  The following portions of the patient's history were reviewed and updated as appropriate: allergies, current medications, past family history, past medical history,  past surgical history, past social history  and problem list.  Visual acuity was not assessed per patient preference since she has regular follow up with her ophthalmologist. Hearing and body mass index were assessed and reviewed.   During the course of the visit the patient was educated and counseled about appropriate screening and preventive services including : fall prevention , diabetes screening, nutrition counseling, colorectal cancer screening, and recommended immunizations.    CC: The primary encounter diagnosis was Neuropathy. Diagnoses of Hyperlipidemia LDL goal <160, Postmenopausal estrogen deficiency, B12 deficiency, Other fatigue, Impaired fasting glucose, Functional constipation, Abdominal bloating, Encounter for screening mammogram for malignant neoplasm of breast, and Folic acid deficiency (non anemic) were also pertinent to this visit.  chronic constipation with very large stool burden noted on both CT an MRI.  Using Clear Lax, has reduced use from daily to  every other day , now twice daily and has increased flax and chia seeds   Neuropathy described as pins and needles in both foot day and night for the last several months   History of b12 deficiency  , diagnosed  with neurpathy was involving calves.    History Erica Maldonado has a past medical history of Abdominal or pelvic swelling, mass, or lump, left lower quadrant,  Epistaxis, Fibroadenoma, Hemorrhoids, external, without mention of complication, Infectious diarrhea(009.2), Other bursitis disorders, Other chest pain, Scleroderma ([place]), Sleep disturbance, unspecified, and Umbilical hernia without mention of  obstruction or gangrene.   She has a past surgical history that includes Hysteroscopy (1995); Dilation and curettage of uterus (2001); Colonoscopy (2001, 2011); Breast biopsy (Right, 1988); and Breast biopsy (Right, 1995).   Her family history includes Breast cancer (age of onset: 66) in her mother; Cancer in her brother and mother; Goiter in her sister.She reports that she has never smoked. She has never used smokeless tobacco. She reports current alcohol use. She reports that she does not use drugs.  Outpatient Medications Prior to Visit  Medication Sig Dispense Refill  . CALCIUM-MAGNESIUM-VITAMIN D PO Take 1 capsule by mouth 2 (two) times daily.    . Cholecalciferol (VITAMIN D3 PO) Take 2,000 Units by mouth daily.    Marland Kitchen estradiol (ESTRACE VAGINAL) 0.1 MG/GM vaginal cream Place 1 Applicatorful vaginally 3 (three) times a week. 42.5 g 12  . Magnesium Ascorbate POWD 125 mg daily by Does not apply route.    . Nutritional Supplements (NUTRITIONAL SUPPLEMENT PO) Take 1 Dose by mouth daily as needed.    . polyethylene glycol (MIRALAX / GLYCOLAX) 17 g packet Take 17 g by mouth. 1 cap 3 days a week    . Omega-3 Fatty Acids (FISH OIL) 1000 MG CAPS Take 1 capsule by mouth.     No facility-administered medications prior to visit.     Review of Systems   Patient denies headache, fevers, malaise, unintentional weight loss, skin rash, eye pain, sinus congestion and sinus pain, sore throat, dysphagia,  hemoptysis , cough, dyspnea, wheezing, chest pain, palpitations, orthopnea, edema, abdominal pain, nausea, melena, diarrhea,  flank pain, dysuria, hematuria, urinary  Frequency, nocturia, numbness, tingling, seizures,  Focal weakness, Loss of consciousness,  Tremor, insomnia, depression, anxiety, and suicidal ideation.      Objective:  BP 112/78 (BP Location: Left Arm, Patient Position: Sitting, Cuff Size: Normal)   Pulse 80   Temp (!) 96.6 F (35.9 C) (Temporal)   Resp 15   Ht 5\' 2"  (1.575 m)   Wt  110 lb (49.9 kg)   SpO2 97%   BMI 20.12 kg/m   Physical Exam  General appearance: alert, cooperative and appears stated age Head: Normocephalic, without obvious abnormality, atraumatic Eyes: conjunctivae/corneas clear. PERRL, EOM's intact. Fundi benign. Ears: normal TM's and external ear canals both ears Nose: Nares normal. Septum midline. Mucosa normal. No drainage or sinus tenderness. Throat: lips, mucosa, and tongue normal; teeth and gums normal Neck: no adenopathy, no carotid bruit, no JVD, supple, symmetrical, trachea midline and thyroid not enlarged, symmetric, no tenderness/mass/nodules Lungs: clear to auscultation bilaterally Breasts: normal appearance, no masses or tenderness Heart: regular rate and rhythm, S1, S2 normal, no murmur, click, rub or gallop Abdomen: soft, non-tender; bowel sounds normal; no masses,  no organomegaly Extremities: extremities normal, atraumatic, no cyanosis or edema Pulses: 2+ and symmetric Skin: Skin color, texture, turgor normal. No rashes or lesions Neurologic: Alert and oriented X 3, normal strength and tone. Normal symmetric reflexes. Normal coordination and gait.     Assessment & Plan:   Problem List Items Addressed This Visit      Unprioritized   Functional constipation    Chronic, with recurrent imaging workups for bloating noting significant stool burden .  obwel regimen reviewed and alternatives recommended       Abdominal bloating    Secondary to constipation per repeated  imaging       Breast cancer screening    Breast exam normal, mammogram up to date       Folic acid deficiency (non anemic)    Rechecking level today        Other Visit Diagnoses    Neuropathy    -  Primary   Relevant Orders   Comprehensive metabolic panel (Completed)   RPR (Completed)   Hemoglobin A1c   Hyperlipidemia LDL goal <160       Relevant Orders   Lipid Profile (Completed)   Postmenopausal estrogen deficiency       Relevant Orders   DG  Bone Density   TSH (Completed)   RBC Folate   B12 deficiency       Relevant Orders   B12 (Completed)   Other fatigue       Relevant Orders   TSH (Completed)   RBC Folate   Impaired fasting glucose       Relevant Orders   Hemoglobin A1c      I have discontinued Anona Shave's Fish Oil. I am also having her start on Zoster Vaccine Adjuvanted. Additionally, I am having her maintain her Magnesium Ascorbate, CALCIUM-MAGNESIUM-VITAMIN D PO, Nutritional Supplements (NUTRITIONAL SUPPLEMENT PO), polyethylene glycol, estradiol, and Cholecalciferol (VITAMIN D3 PO).  Meds ordered this encounter  Medications  . Zoster Vaccine Adjuvanted [facility]) injection    Sig: Inject 0.5 mLs into the muscle once for 1 dose.    Dispense:  1 each    Refill:  1    Medications Discontinued During This Encounter  Medication Reason  . Omega-3 Fatty Acids (FISH OIL) 1000 MG CAPS Patient has not taken in last 30 days  A total of 40 minutes was spent with patient more than half of which was spent in counseling patient on the above mentioned issues , reviewing and explaining recent labs and imaging studies done, and coordination of care.   Follow-up: No follow-ups on file.   Crecencio Mc, MD

## 2019-10-13 NOTE — Progress Notes (Addendum)
Subjective:   Erica Maldonado is a 68 y.o. female who presents for Medicare Annual (Subsequent) preventive examination.  Review of Systems:  No ROS.  Medicare Wellness Virtual Visit.  Visual/audio telehealth visit, UTA vital signs.   See social history for additional risk factors.   Cardiac Risk Factors include: advanced age (>15men, >13 women)     Objective:     Vitals: There were no vitals taken for this visit.  There is no height or weight on file to calculate BMI.  Advanced Directives 10/13/2019 10/08/2018  Does Patient Have a Medical Advance Directive? Yes Yes  Type of Paramedic of [place];Living will [place];Living will  Does patient want to make changes to medical advance directive? No - Patient declined No - Patient declined  Copy of [place] in Chart? Yes - validated most recent copy scanned in chart (See row information) Yes - validated most recent copy scanned in chart (See row information)    Tobacco Social History   Tobacco Use  Smoking Status Never Smoker  Smokeless Tobacco Never Used  Tobacco Comment   Remote history     Counseling given: Not Answered Comment: Remote history   Clinical Intake:  Pre-visit preparation completed: Yes        Diabetes: No  How often do you need to have someone help you when you read instructions, pamphlets, or other written materials from your doctor or pharmacy?: 1 - Never  Interpreter Needed?: No     Past Medical History:  Diagnosis Date  . Abdominal or pelvic swelling, mass, or lump, left lower quadrant   . Epistaxis    s/p cauterization  . Fibroadenoma    right breast  . Hemorrhoids, external, without mention of complication   . Infectious diarrhea(009.2)    history of  . Other bursitis disorders   . Other chest pain   . Scleroderma ([place])   . Sleep disturbance, unspecified   . Umbilical hernia without mention of obstruction or gangrene     Past Surgical History:  Procedure Laterality Date  . BREAST BIOPSY Right 1988   fibroadenoma removed,neg  . BREAST BIOPSY Right 1995   Dr Bary Castilla, neg  . COLONOSCOPY  2001, 2011   Dr Vira Agar  . DILATION AND CURETTAGE OF UTERUS  2001   Dr Laurey Morale  . HYSTEROSCOPY  1995   normal/ Dr Laurey Morale   Family History  Problem Relation Age of Onset  . Breast cancer Mother 69  . Cancer Mother        breast, developed uterine cancer while on tamoxifen  . Cancer Brother        melanoma/scalp  . Goiter Sister        Twin   Social History   Socioeconomic History  . Marital status: Married    Spouse name: Not on file  . Number of children: Not on file  . Years of education: Not on file  . Highest education level: Not on file  Occupational History  . Occupation: Marine scientist- full time  Social Needs  . Financial resource strain: Not hard at all  . Food insecurity    Worry: Never true    Inability: Never true  . Transportation needs    Medical: No    Non-medical: No  Tobacco Use  . Smoking status: Never Smoker  . Smokeless tobacco: Never Used  . Tobacco comment: Remote history  Substance and Sexual Activity  . Alcohol use: Yes  Comment: daily red wine  . Drug use: No  . Sexual activity: Not on file  Lifestyle  . Physical activity    Days per week: 5 days    Minutes per session: 60 min  . Stress: Not at all  Relationships  . Social Herbalist on phone: Not on file    Gets together: Not on file    Attends religious service: Not on file    Active member of club or organization: Not on file    Attends meetings of clubs or organizations: Not on file    Relationship status: Not on file  Other Topics Concern  . Not on file  Social History Narrative   Lives with spouse, daughter.    Has a cat.   Always uses seat belts.   Heavy exercise.   Gets annual pap smears by gyn, has annual mammograms at [facility].    Outpatient Encounter Medications as of 10/13/2019   Medication Sig  . CALCIUM-MAGNESIUM-VITAMIN D PO Take 1 capsule by mouth 2 (two) times daily.  . Cholecalciferol (VITAMIN D3 PO) Take 2,000 Units by mouth daily.  Marland Kitchen estradiol (ESTRACE VAGINAL) 0.1 MG/GM vaginal cream Place 1 Applicatorful vaginally 3 (three) times a week.  . Magnesium Ascorbate POWD 125 mg daily by Does not apply route.  . Nutritional Supplements (NUTRITIONAL SUPPLEMENT PO) Take 1 Dose by mouth daily as needed.  . polyethylene glycol (MIRALAX / GLYCOLAX) 17 g packet Take 17 g by mouth. 1 cap 3 days a week  . Omega-3 Fatty Acids (FISH OIL) 1000 MG CAPS Take 1 capsule by mouth.  . [DISCONTINUED] cholecalciferol (VITAMIN D) 1000 UNITS tablet Take 2,000 Units by mouth daily.  . [DISCONTINUED] docusate sodium (COLACE) 100 MG capsule Take 300 mg by mouth daily.  . [DISCONTINUED] PREBIOTIC PRODUCT PO Take 750 mg by mouth daily.   No facility-administered encounter medications on file as of 10/13/2019.     Activities of Daily Living In your present state of health, do you have any difficulty performing the following activities: 10/13/2019  Hearing? N  Vision? N  Difficulty concentrating or making decisions? N  Walking or climbing stairs? N  Dressing or bathing? N  Doing errands, shopping? N  Preparing Food and eating ? N  Using the Toilet? N  In the past six months, have you accidently leaked urine? N  Do you have problems with loss of bowel control? N  Managing your Medications? N  Managing your Finances? N  Housekeeping or managing your Housekeeping? N  Some recent data might be hidden    Patient Care Team: Crecencio Mc, MD as PCP - General (Internal Medicine) Bary Castilla Forest Gleason, MD as Consulting Physician (General Surgery) Requested, Self    Assessment:   This is a routine wellness examination for Erica Maldonado.  Nurse connected with patient 10/13/19 at 12:30 PM EST by a telephone enabled telemedicine application and verified that I am speaking with the correct person  using two identifiers. Patient stated full name and DOB. Patient gave permission to continue with virtual visit. Patient's location was at home and Nurse's location was at [place] office.   Health Maintenance Due: See completed HM at the end of note.   Eye: Visual acuity not assessed. Virtual visit. Wears corrective lenses. Followed by their ophthalmologist.  Dental: Visits every 6 months.    Hearing: Demonstrates normal hearing during visit.  Safety:  Patient feels safe at home- yes Patient does have smoke detectors at home- yes  Patient does wear sunscreen or protective clothing when in direct sunlight - yes Patient does wear seat belt when in a moving vehicle - yes Patient drives- yes Adequate lighting in walkways free from debris- yes Grab bars and handrails used as appropriate- yes Ambulates with no assistive device Cell phone on person when ambulating outside of the home- yes  Social: Alcohol intake - yes      Smoking history- never   Smokers in home? none Illicit drug use? none  Depression: PHQ 2 &9 complete. See screening below. Denies irritability, anhedonia, sadness/tearfullness.     Falls: See screening below.    Medication: Taking as directed and without issues.   Covid-19: Precautions and sickness symptoms discussed. Wears mask, social distancing, hand hygiene as appropriate.   Activities of Daily Living Patient denies needing assistance with: household chores, feeding themselves, getting from bed to chair, getting to the toilet, bathing/showering, dressing, managing money, or preparing meals.   Memory: Patient is alert. Patient denies difficulty focusing or concentrating. Correctly identified the president of the [place], season and recall. Patient likes to volunteer at the hospital, read, play computer games, complete puzzles for brain stimulation.   BMI- discussed the importance of a healthy diet, water intake and the benefits of aerobic exercise.   Educational material provided.  Physical activity- pilates, cardio, simply fit board  Diet:  Regular Water: 64 ounces  Other Providers Patient Care Team: Crecencio Mc, MD as PCP - General (Internal Medicine) Bary Castilla, Forest Gleason, MD as Consulting Physician (General Surgery) Requested, Self  Exercise Activities and Dietary recommendations Current Exercise Habits: Home exercise routine, Type of exercise: walking;calisthenics;stretching, Time (Minutes): 60, Frequency (Times/Week): 5, Weekly Exercise (Minutes/Week): 300, Intensity: Mild  Goals    . Maintain healthy lifestyle       Fall Risk Fall Risk  10/13/2019 03/18/2019 10/08/2018 09/28/2017 09/27/2016  Falls in the past year? 0 0 0 No Yes  Number falls in past yr: 0 - - - -  Injury with Fall? - - - - No  Risk for fall due to : - - - - Other (Comment)  Follow up Falls prevention discussed;Education provided - - - -   Timed Get Up and Go performed: no, virtual visit  Depression Screen PHQ 2/9 Scores 10/13/2019 10/08/2018 09/28/2017 09/27/2016  PHQ - 2 Score 0 0 0 0  PHQ- 9 Score - - 0 -     Cognitive Function MMSE - Mini Mental State Exam 10/08/2018  Orientation to time 5  Orientation to Place 5  Registration 3  Attention/ Calculation 5  Recall 3  Language- name 2 objects 2  Language- repeat 1  Language- follow 3 step command 3  Language- read & follow direction 1  Write a sentence 1  Copy design 1  Total score 30     6CIT Screen 10/13/2019  What Year? 0 points  What month? 0 points  What time? 0 points  Count back from 20 0 points  Months in reverse 0 points  Repeat phrase 0 points  Total Score 0    Immunization History  Administered Date(s) Administered  . Influenza-Unspecified 08/27/2014, 09/17/2015, 08/08/2016, 07/31/2017, 08/01/2018  . Td 08/26/2007  . Tdap 10/04/2017   Screening Tests Health Maintenance  Topic Date Due  . PNA vac Low Risk Adult (1 of 2 - PCV13) 10/12/2020 (Originally 11/11/2015)   . MAMMOGRAM  10/01/2021  . COLONOSCOPY  12/04/2021  . TETANUS/TDAP  10/05/2027  . INFLUENZA VACCINE  Completed  . DEXA  SCAN  Completed  . Hepatitis C Screening  Completed      Plan:   Keep all routine maintenance appointments.   Cpe 10/13/19  Medicare Attestation I have personally reviewed: The patient's medical and social history Their use of alcohol, tobacco or illicit drugs Their current medications and supplements The patient's functional ability including ADLs,fall risks, home safety risks, cognitive, and hearing and visual impairment Diet and physical activities Evidence for depression   In addition, I have reviewed and discussed with patient certain preventive protocols, quality metrics, and best practice recommendations. A written personalized care plan for preventive services as well as general preventive health recommendations were provided to patient via mail.     Erica Maldonado, Erica Marschke L, LPN  [phone]     I have reviewed the above information and agree with above.   Deborra Medina, MD

## 2019-10-14 LAB — LIPID PANEL
Cholesterol: 217 mg/dL — ABNORMAL HIGH (ref <200)
HDL: 78 mg/dL (ref >=50)
LDL Cholesterol (Calc): 114 mg/dL (calc) — ABNORMAL HIGH
Non-HDL Cholesterol (Calc): 139 mg/dL (calc) — ABNORMAL HIGH (ref <130)
Total CHOL/HDL Ratio: 2.8 (calc) (ref <5.0)
Triglycerides: 132 mg/dL (ref <150)

## 2019-10-14 LAB — COMPREHENSIVE METABOLIC PANEL
AG Ratio: 1.8 (calc) (ref 1.0–2.5)
ALT: 17 U/L (ref 6–29)
AST: 24 U/L (ref 10–35)
Albumin: 4.6 g/dL (ref 3.6–5.1)
Alkaline phosphatase (APISO): 64 U/L (ref 37–153)
BUN: 15 mg/dL (ref 7–25)
CO2: 27 mmol/L (ref 20–32)
Calcium: 10.3 mg/dL (ref 8.6–10.4)
Chloride: 104 mmol/L (ref 98–110)
Creat: 0.81 mg/dL (ref 0.50–0.99)
Globulin: 2.5 g/dL (calc) (ref 1.9–3.7)
Glucose, Bld: 88 mg/dL (ref 65–99)
Potassium: 4.8 mmol/L (ref 3.5–5.3)
Sodium: 146 mmol/L (ref 135–146)
Total Bilirubin: 0.5 mg/dL (ref 0.2–1.2)
Total Protein: 7.1 g/dL (ref 6.1–8.1)

## 2019-10-14 LAB — VITAMIN B12: Vitamin B-12: 357 pg/mL (ref 200–1100)

## 2019-10-14 LAB — TSH: TSH: 2.89 mIU/L (ref 0.40–4.50)

## 2019-10-14 LAB — RPR: RPR Ser Ql: NONREACTIVE

## 2019-10-14 NOTE — Assessment & Plan Note (Signed)
Chronic, with recurrent imaging workups for bloating noting significant stool burden .  obwel regimen reviewed and alternatives recommended

## 2019-10-14 NOTE — Assessment & Plan Note (Signed)
Secondary to constipation per repeated imaging

## 2019-10-14 NOTE — Assessment & Plan Note (Signed)
Breast exam normal, mammogram up to date

## 2019-10-14 NOTE — Assessment & Plan Note (Signed)
Rechecking level today. 

## 2019-10-20 ENCOUNTER — Other Ambulatory Visit: Payer: Self-pay

## 2019-10-20 ENCOUNTER — Other Ambulatory Visit (INDEPENDENT_AMBULATORY_CARE_PROVIDER_SITE_OTHER): Payer: Medicare Other

## 2019-10-20 DIAGNOSIS — Z780 Asymptomatic menopausal state: Secondary | ICD-10-CM | POA: Diagnosis not present

## 2019-10-20 NOTE — Addendum Note (Signed)
Addended by: Elpidio Galea T on: 10/20/2019 11:26 AM   Modules accepted: Orders

## 2020-02-24 DIAGNOSIS — E041 Nontoxic single thyroid nodule: Secondary | ICD-10-CM | POA: Diagnosis not present

## 2020-04-07 ENCOUNTER — Telehealth: Payer: Self-pay | Admitting: Internal Medicine

## 2020-04-07 NOTE — Telephone Encounter (Signed)
Pt is having tingling in feet and legs. Wants to know if she should come in or take B vitamins? Please advise

## 2020-04-08 NOTE — Telephone Encounter (Signed)
Spoke with pt and she has been scheduled for an appt with Dr. Derrel Nip next Monday at 2:30pm.

## 2020-04-12 ENCOUNTER — Encounter: Payer: Self-pay | Admitting: Internal Medicine

## 2020-04-12 ENCOUNTER — Ambulatory Visit (INDEPENDENT_AMBULATORY_CARE_PROVIDER_SITE_OTHER): Payer: Medicare Other | Admitting: Internal Medicine

## 2020-04-12 ENCOUNTER — Other Ambulatory Visit: Payer: Self-pay

## 2020-04-12 VITALS — BP 118/74 | HR 75 | Temp 98.0°F | Resp 14 | Ht 62.0 in | Wt 107.0 lb

## 2020-04-12 DIAGNOSIS — Z7189 Other specified counseling: Secondary | ICD-10-CM | POA: Diagnosis not present

## 2020-04-12 DIAGNOSIS — R7301 Impaired fasting glucose: Secondary | ICD-10-CM | POA: Diagnosis not present

## 2020-04-12 DIAGNOSIS — R202 Paresthesia of skin: Secondary | ICD-10-CM | POA: Diagnosis not present

## 2020-04-12 DIAGNOSIS — E041 Nontoxic single thyroid nodule: Secondary | ICD-10-CM | POA: Diagnosis not present

## 2020-04-12 DIAGNOSIS — R200 Anesthesia of skin: Secondary | ICD-10-CM

## 2020-04-12 DIAGNOSIS — Z636 Dependent relative needing care at home: Secondary | ICD-10-CM | POA: Diagnosis not present

## 2020-04-12 DIAGNOSIS — E538 Deficiency of other specified B group vitamins: Secondary | ICD-10-CM | POA: Diagnosis not present

## 2020-04-12 NOTE — Patient Instructions (Addendum)
I am rechecking your B12 folate, thyroid and A1c    Peripheral Neuropathy Peripheral neuropathy is a type of nerve damage. It affects nerves that carry signals between the spinal cord and the arms, legs, and the rest of the body (peripheral nerves). It does not affect nerves in the spinal cord or brain. In peripheral neuropathy, one nerve or a group of nerves may be damaged. Peripheral neuropathy is a broad category that includes many specific nerve disorders, like diabetic neuropathy, hereditary neuropathy, and carpal tunnel syndrome. What are the causes? This condition may be caused by:  Diabetes. This is the most common cause of peripheral neuropathy.  Nerve injury.  Pressure or stress on a nerve that lasts a long time.  Lack (deficiency) of B vitamins. This can result from alcoholism, poor diet, or a restricted diet.  Infections.  Autoimmune diseases, such as rheumatoid arthritis and systemic lupus erythematosus.  Nerve diseases that are passed from parent to child (inherited).  Some medicines, such as cancer medicines (chemotherapy).  Poisonous (toxic) substances, such as lead and mercury.  Too little blood flowing to the legs.  Kidney disease.  Thyroid disease. In some cases, the cause of this condition is not known. What are the signs or symptoms? Symptoms of this condition depend on which of your nerves is damaged. Common symptoms include:  Loss of feeling (numbness) in the feet, hands, or both.  Tingling in the feet, hands, or both.  Burning pain.  Very sensitive skin.  Weakness.  Not being able to move a part of the body (paralysis).  Muscle twitching.  Clumsiness or poor coordination.  Loss of balance.  Not being able to control your bladder.  Feeling dizzy.  Sexual problems. How is this diagnosed? Diagnosing and finding the cause of peripheral neuropathy can be difficult. Your health care provider will take your medical history and do a physical  exam. A neurological exam will also be done. This involves checking things that are affected by your brain, spinal cord, and nerves (nervous system). For example, your health care provider will check your reflexes, how you move, and what you can feel. You may have other tests, such as:  Blood tests.  Electromyogram (EMG) and nerve conduction tests. These tests check nerve function and how well the nerves are controlling the muscles.  Imaging tests, such as CT scans or MRI to rule out other causes of your symptoms.  Removing a small piece of nerve to be examined in a lab (nerve biopsy). This is rare.  Removing and examining a small amount of the fluid that surrounds the brain and spinal cord (lumbar puncture). This is rare. How is this treated? Treatment for this condition may involve:  Treating the underlying cause of the neuropathy, such as diabetes, kidney disease, or vitamin deficiencies.  Stopping medicines that can cause neuropathy, such as chemotherapy.  Medicine to relieve pain. Medicines may include: ? Prescription or over-the-counter pain medicine. ? Antiseizure medicine. ? Antidepressants. ? Pain-relieving patches that are applied to painful areas of skin.  Surgery to relieve pressure on a nerve or to destroy a nerve that is causing pain.  Physical therapy to help improve movement and balance.  Devices to help you move around (assistive devices). Follow these instructions at home: Medicines  Take over-the-counter and prescription medicines only as told by your health care provider. Do not take any other medicines without first asking your health care provider.  Do not drive or use heavy machinery while taking prescription pain  medicine. Lifestyle   Do not use any products that contain nicotine or tobacco, such as cigarettes and e-cigarettes. Smoking keeps blood from reaching damaged nerves. If you need help quitting, ask your health care provider.  Avoid or limit  alcohol. Too much alcohol can cause a vitamin B deficiency, and vitamin B is needed for healthy nerves.  Eat a healthy diet. This includes: ? Eating foods that are high in fiber, such as fresh fruits and vegetables, whole grains, and beans. ? Limiting foods that are high in fat and processed sugars, such as fried or sweet foods. General instructions   If you have diabetes, work closely with your health care provider to keep your blood sugar under control.  If you have numbness in your feet: ? Check every day for signs of injury or infection. Watch for redness, warmth, and swelling. ? Wear padded socks and comfortable shoes. These help protect your feet.  Develop a good support system. Living with peripheral neuropathy can be stressful. Consider talking with a mental health specialist or joining a support group.  Use assistive devices and attend physical therapy as told by your health care provider. This may include using a walker or a cane.  Keep all follow-up visits as told by your health care provider. This is important. Contact a health care provider if:  You have new signs or symptoms of peripheral neuropathy.  You are struggling emotionally from dealing with peripheral neuropathy.  Your pain is not well-controlled. Get help right away if:  You have an injury or infection that is not healing normally.  You develop new weakness in an arm or leg.  You fall frequently. Summary  Peripheral neuropathy is when the nerves in the arms, or legs are damaged, resulting in numbness, weakness, or pain.  There are many causes of peripheral neuropathy, including diabetes, pinched nerves, vitamin deficiencies, autoimmune disease, and hereditary conditions.  Diagnosing and finding the cause of peripheral neuropathy can be difficult. Your health care provider will take your medical history, do a physical exam, and do tests, including blood tests and nerve function tests.  Treatment  involves treating the underlying cause of the neuropathy and taking medicines to help control pain. Physical therapy and assistive devices may also help. This information is not intended to replace advice given to you by your health care provider. Make sure you discuss any questions you have with your health care provider. Document Revised: 10/19/2017 Document Reviewed: 01/15/2017 Elsevier Patient Education  2020 [employer].

## 2020-04-12 NOTE — Progress Notes (Signed)
Subjective:  Patient ID: Erica Maldonado, female    DOB: 1951-03-08  Age: 69 y.o. MRN: [id number]  CC: The primary encounter diagnosis was Folic acid deficiency (non anemic). Diagnoses of Thyroid nodule, Numbness and tingling of both lower extremities, Impaired fasting glucose, Numbness and tingling of both legs below knees, and Caregiver burden were also pertinent to this visit.  HPI Annalynn Kable presents for follow up on  Numbness and tingling of  lower extremities   This visit occurred during the SARS-CoV-2 public health emergency.  Safety protocols were in place, including screening questions prior to the visit, additional usage of staff PPE, and extensive cleaning of exam room while observing appropriate contact time as indicated for disinfecting solutions.    Patient has received NO  doses of the available COVID 19 vaccines.   Patient continues to mask when outside of the home except when walking in yard or at safe distances from others .  Patient denies any change in mood or development of unhealthy behaviors resuting from the pandemic's restriction of activities and socialization.    Tingling of feet and lower calves  .  The sensation is amplified after engaging in hours of physical activities,  sometimes worse at night  .  Has occasional low back pain but does not radiate to legs.  But does not affect her sleep .   No foot drop,  No weakness,   No restless legs.  Did not grow up with well water or use it in current home. Alcohol use limited to one glass of wine per night.  Omnivorous diet.  No history of diabetes or thyroid disease.  Previously low folic acid level 4 years ago addressed with supplementation.  l  She cites Increased stressors due to multiple family issues : 1) shared responsibilities of caring for elderly father, who she states is mentally competent but has impaired gait/balance,  Urinary incontinence with frequent soiled pants despite wearing an adult diaper, and has   frequent falls.  She is one of 3 siblings who take turns housing father throughout the year.  Currently her caregiver responsibilities are complicated by the selling of the family home and their closing on a house in [place], all of which is schedule to happen during the first 2 weeks in June   Outpatient Medications Prior to Visit  Medication Sig Dispense Refill  . CALCIUM-MAGNESIUM-VITAMIN D PO Take 1 capsule by mouth 2 (two) times daily.    . Cholecalciferol (VITAMIN D3 PO) Take 2,000 Units by mouth daily.    Marland Kitchen docusate sodium (COLACE) 100 MG capsule Take 100 mg by mouth 2 (two) times daily.    Marland Kitchen estradiol (ESTRACE VAGINAL) 0.1 MG/GM vaginal cream Place 1 Applicatorful vaginally 3 (three) times a week. 42.5 g 12  . Magnesium Ascorbate POWD 125 mg daily by Does not apply route.    . Nutritional Supplements (NUTRITIONAL SUPPLEMENT PO) Take 1 Dose by mouth daily as needed.    . polyethylene glycol (MIRALAX / GLYCOLAX) 17 g packet Take 17 g by mouth. 1 cap 3 days a week     No facility-administered medications prior to visit.    Review of Systems;  Patient denies headache, fevers, malaise, unintentional weight loss, skin rash, eye pain, sinus congestion and sinus pain, sore throat, dysphagia,  hemoptysis , cough, dyspnea, wheezing, chest pain, palpitations, orthopnea, edema, abdominal pain, nausea, melena, diarrhea, constipation, flank pain, dysuria, hematuria, urinary  Frequency, nocturia, , seizures,  Focal weakness, Loss of consciousness,  Tremor,  insomnia, depression, anxiety, and suicidal ideation.      Objective:  BP 118/74 (BP Location: Left Arm, Patient Position: Sitting, Cuff Size: Normal)   Pulse 75   Temp 98 F (36.7 C) (Temporal)   Resp 14   Ht 5\' 2"  (1.575 m)   Wt 107 lb (48.5 kg)   SpO2 99%   BMI 19.57 kg/m   BP Readings from Last 3 Encounters:  04/12/20 118/74  10/13/19 112/78  03/18/19 (!) 143/88    Wt Readings from Last 3 Encounters:  04/12/20 107 lb (48.5 kg)    10/13/19 110 lb (49.9 kg)  03/18/19 109 lb 9.6 oz (49.7 kg)    General appearance: alert, cooperative and appears stated age Ears: normal TM's and external ear canals both ears Throat: lips, mucosa, and tongue normal; teeth and gums normal Neck: no adenopathy, no carotid bruit, supple, symmetrical, trachea midline and thyroid not enlarged, symmetric, no tenderness/mass/nodules Back: symmetric, no curvature. ROM normal. No CVA tenderness. Lungs: clear to auscultation bilaterally Heart: regular rate and rhythm, S1, S2 normal, no murmur, click, rub or gallop Abdomen: soft, non-tender; bowel sounds normal; no masses,  no organomegaly Pulses: DP and TA pulses are 2+ and symmetric Skin: Skin color, texture, turgor normal. No rashes or lesions Lymph nodes: Cervical, supraclavicular, and axillary nodes normal. Foot exam:  Nails are well trimmed,  No callouses,  Sensation intact to microfilament.  Cap refill < 2 sec     MM 3D SCREEN BREAST BILATERAL  Result Date: 10/03/2019 CLINICAL DATA:  Screening. EXAM: DIGITAL SCREENING BILATERAL MAMMOGRAM WITH TOMO AND CAD COMPARISON:  Previous exam(s). ACR Breast Density Category c: The breast tissue is heterogeneously dense, which may obscure small masses. FINDINGS: There are no findings suspicious for malignancy. Images were processed with CAD. IMPRESSION: No mammographic evidence of malignancy. A result letter of this screening mammogram will be mailed directly to the patient. RECOMMENDATION: Screening mammogram in one year. (Code:SM-B-01Y) BI-RADS CATEGORY  1: Negative. Electronically Signed   By: Lajean Manes M.D.   On: 10/03/2019 09:33    Assessment & Plan:   Problem List Items Addressed This Visit      Unprioritized   Caregiver burden    She shares the responsbility of caring for her 59 yr old father and is currently overwhelmed by his urinary incontinence and frequent falls  .  counselling given .      Folic acid deficiency (non anemic) -  Primary   Relevant Orders   B12 and Folate Panel (Completed)   CBC with Differential/Platelet (Completed)   Numbness and tingling of both legs below knees    All symptoms are subjective thus far.  Sensation is intact to microfilament .  Rechecking B12 , folate ,  Thyroid and A1c.  No history of well water or alcohol abuse  Lab Results  Component Value Date   VITAMINB12 365 04/12/2020   Lab Results  Component Value Date   FOLATE 13.5 04/12/2020   Lab Results  Component Value Date   TSH 2.31 04/12/2020   Lab Results  Component Value Date   HGBA1C 5.0 04/12/2020         Thyroid nodule   Relevant Orders   CBC with Differential/Platelet (Completed)   TSH (Completed)    Other Visit Diagnoses    Numbness and tingling of both lower extremities       Impaired fasting glucose       Relevant Orders   Hemoglobin A1c (Completed)   Comprehensive metabolic  panel (Completed)       I provided  30 minutes of  face-to-face time during this encounter reviewing patient's current problems and past surgeries, labs and imaging studies, providing counseling on the above mentioned problems , and coordination  of care . I am having Erica Maldonado maintain her Magnesium Ascorbate, CALCIUM-MAGNESIUM-VITAMIN D PO, Nutritional Supplements (NUTRITIONAL SUPPLEMENT PO), polyethylene glycol, estradiol, Cholecalciferol (VITAMIN D3 PO), and docusate sodium.  No orders of the defined types were placed in this encounter.   There are no discontinued medications.  Follow-up: No follow-ups on file.   Crecencio Mc, MD

## 2020-04-13 DIAGNOSIS — Z7189 Other specified counseling: Secondary | ICD-10-CM | POA: Insufficient documentation

## 2020-04-13 DIAGNOSIS — G629 Polyneuropathy, unspecified: Secondary | ICD-10-CM | POA: Insufficient documentation

## 2020-04-13 DIAGNOSIS — Z636 Dependent relative needing care at home: Secondary | ICD-10-CM | POA: Insufficient documentation

## 2020-04-13 LAB — CBC WITH DIFFERENTIAL/PLATELET
Absolute Monocytes: 541 cells/uL (ref 200–950)
Basophils Absolute: 40 cells/uL (ref 0–200)
Basophils Relative: 0.6 %
Eosinophils Absolute: 40 cells/uL (ref 15–500)
Eosinophils Relative: 0.6 %
HCT: 41.2 % (ref 35.0–45.0)
Hemoglobin: 13.4 g/dL (ref 11.7–15.5)
Lymphs Abs: 1756 cells/uL (ref 850–3900)
MCH: 29.6 pg (ref 27.0–33.0)
MCHC: 32.5 g/dL (ref 32.0–36.0)
MCV: 90.9 fL (ref 80.0–100.0)
MPV: 11.4 fL (ref 7.5–12.5)
Monocytes Relative: 8.2 %
Neutro Abs: 4224 cells/uL (ref 1500–7800)
Neutrophils Relative %: 64 %
Platelets: 244 10*3/uL (ref 140–400)
RBC: 4.53 10*6/uL (ref 3.80–5.10)
RDW: 13.1 % (ref 11.0–15.0)
Total Lymphocyte: 26.6 %
WBC: 6.6 10*3/uL (ref 3.8–10.8)

## 2020-04-13 LAB — COMPREHENSIVE METABOLIC PANEL
AG Ratio: 1.8 (calc) (ref 1.0–2.5)
ALT: 27 U/L (ref 6–29)
AST: 28 U/L (ref 10–35)
Albumin: 4.4 g/dL (ref 3.6–5.1)
Alkaline phosphatase (APISO): 63 U/L (ref 37–153)
BUN: 20 mg/dL (ref 7–25)
CO2: 29 mmol/L (ref 20–32)
Calcium: 10 mg/dL (ref 8.6–10.4)
Chloride: 103 mmol/L (ref 98–110)
Creat: 0.91 mg/dL (ref 0.50–0.99)
Globulin: 2.4 g/dL (calc) (ref 1.9–3.7)
Glucose, Bld: 86 mg/dL (ref 65–99)
Potassium: 4 mmol/L (ref 3.5–5.3)
Sodium: 142 mmol/L (ref 135–146)
Total Bilirubin: 0.5 mg/dL (ref 0.2–1.2)
Total Protein: 6.8 g/dL (ref 6.1–8.1)

## 2020-04-13 LAB — HEMOGLOBIN A1C
Hgb A1c MFr Bld: 5 % of total Hgb (ref <5.7)
Mean Plasma Glucose: 97 (calc)
eAG (mmol/L): 5.4 (calc)

## 2020-04-13 LAB — B12 AND FOLATE PANEL
Folate: 13.5 ng/mL
Vitamin B-12: 365 pg/mL (ref 200–1100)

## 2020-04-13 LAB — TSH: TSH: 2.31 mIU/L (ref 0.40–4.50)

## 2020-04-13 NOTE — Assessment & Plan Note (Signed)
All symptoms are subjective thus far.  Sensation is intact to microfilament .  Rechecking B12 , folate ,  Thyroid and A1c.  No history of well water or alcohol abuse  Lab Results  Component Value Date   VITAMINB12 365 04/12/2020   Lab Results  Component Value Date   FOLATE 13.5 04/12/2020   Lab Results  Component Value Date   TSH 2.31 04/12/2020   Lab Results  Component Value Date   HGBA1C 5.0 04/12/2020

## 2020-04-13 NOTE — Assessment & Plan Note (Signed)
  Patient has made an informed decision to defer vaccination against COVID will the available COVID 19 vaccines.   Patient continues to mask when outside of the home except when walking in yard or at safe distances from others .  Patient denies any change in mood or development of unhealthy behaviors resuting from the pandemic's restriction of activities and socialization.

## 2020-04-13 NOTE — Assessment & Plan Note (Signed)
She shares the responsbility of caring for her 70 yr old father and is currently overwhelmed by his urinary incontinence and frequent falls  .  counselling given .

## 2020-04-15 DIAGNOSIS — R200 Anesthesia of skin: Secondary | ICD-10-CM

## 2020-04-15 DIAGNOSIS — Z8049 Family history of malignant neoplasm of other genital organs: Secondary | ICD-10-CM

## 2020-04-23 ENCOUNTER — Encounter: Payer: Self-pay | Admitting: Neurology

## 2020-07-27 ENCOUNTER — Telehealth: Payer: Self-pay | Admitting: Internal Medicine

## 2020-07-27 NOTE — Telephone Encounter (Signed)
FYI

## 2020-07-27 NOTE — Telephone Encounter (Addendum)
Patient called in stated that she is a little frustrated because she had to wait four for the 1st appt with the neurologist  then they called and cancelled it and wanted to schedule it  four more months out just wanted Dr.Tullo to know.

## 2020-08-06 ENCOUNTER — Ambulatory Visit: Payer: Medicare Other | Admitting: Neurology

## 2020-09-07 DIAGNOSIS — H35372 Puckering of macula, left eye: Secondary | ICD-10-CM | POA: Diagnosis not present

## 2020-09-07 DIAGNOSIS — H2513 Age-related nuclear cataract, bilateral: Secondary | ICD-10-CM | POA: Diagnosis not present

## 2020-09-07 DIAGNOSIS — H43813 Vitreous degeneration, bilateral: Secondary | ICD-10-CM | POA: Diagnosis not present

## 2020-09-10 ENCOUNTER — Other Ambulatory Visit: Payer: Self-pay | Admitting: Internal Medicine

## 2020-09-10 DIAGNOSIS — Z1231 Encounter for screening mammogram for malignant neoplasm of breast: Secondary | ICD-10-CM

## 2020-10-13 ENCOUNTER — Ambulatory Visit (INDEPENDENT_AMBULATORY_CARE_PROVIDER_SITE_OTHER): Payer: Medicare Other

## 2020-10-13 VITALS — Ht 62.0 in | Wt 107.0 lb

## 2020-10-13 DIAGNOSIS — Z0000 Encounter for general adult medical examination without abnormal findings: Secondary | ICD-10-CM | POA: Diagnosis not present

## 2020-10-13 NOTE — Patient Instructions (Addendum)
Erica Maldonado , Thank you for taking time to come for your Medicare Wellness Visit. I appreciate your ongoing commitment to your health goals. Please review the following plan we discussed and let me know if I can assist you in the future.   These are the goals we discussed: Goals    . Maintain healthy lifestyle     Stay active Healthy diet       This is a list of the screening recommended for you and due dates:  Health Maintenance  Topic Date Due  . Flu Shot  02/17/2021*  . Pneumonia vaccines (1 of 2 - PCV13) 10/13/2021*  . Mammogram  10/01/2021  . Colon Cancer Screening  12/04/2021  . Tetanus Vaccine  10/05/2027  . DEXA scan (bone density measurement)  Completed  .  Hepatitis C: One time screening is recommended by Center for Disease Control  (CDC) for  adults born from [birth year] through 1966.   Completed  . COVID-19 Vaccine  Discontinued  *Topic was postponed. The date shown is not the original due date.    Immunizations Immunization History  Administered Date(s) Administered  . Influenza-Unspecified 08/27/2014, 09/17/2015, 08/08/2016, 07/31/2017, 08/01/2018, 09/18/2019  . Td 08/26/2007  . Tdap 10/04/2017   Keep all routine maintenance appointments.   Mammogram- 10/20/20 @ 11:00  Cpe 10/20/20 @ 2:00  Advanced directives: completed, on file.   Conditions/risks identified: none new.   Follow up in one year for your annual wellness visit.   Preventive Care 70 Years and Older, Female Preventive care refers to lifestyle choices and visits with your health care provider that can promote health and wellness. What does preventive care include?  A yearly physical exam. This is also called an annual well check.  Dental exams once or twice a year.  Routine eye exams. Ask your health care provider how often you should have your eyes checked.  Personal lifestyle choices, including:  Daily care of your teeth and gums.  Regular physical activity.  Eating a healthy  diet.  Avoiding tobacco and drug use.  Limiting alcohol use.  Practicing safe sex.  Taking low-dose aspirin every day.  Taking vitamin and mineral supplements as recommended by your health care provider. What happens during an annual well check? The services and screenings done by your health care provider during your annual well check will depend on your age, overall health, lifestyle risk factors, and family history of disease. Counseling  Your health care provider may ask you questions about your:  Alcohol use.  Tobacco use.  Drug use.  Emotional well-being.  Home and relationship well-being.  Sexual activity.  Eating habits.  History of falls.  Memory and ability to understand (cognition).  Work and work Statistician.  Reproductive health. Screening  You may have the following tests or measurements:  Height, weight, and BMI.  Blood pressure.  Lipid and cholesterol levels. These may be checked every 5 years, or more frequently if you are over 6 years old.  Skin check.  Lung cancer screening. You may have this screening every year starting at age 19 if you have a 30-pack-year history of smoking and currently smoke or have quit within the past 15 years.  Fecal occult blood test (FOBT) of the stool. You may have this test every year starting at age 32.  Flexible sigmoidoscopy or colonoscopy. You may have a sigmoidoscopy every 5 years or a colonoscopy every 10 years starting at age 80.  Hepatitis C blood test.  Hepatitis B blood test.  Sexually transmitted disease (STD) testing.  Diabetes screening. This is done by checking your blood sugar (glucose) after you have not eaten for a while (fasting). You may have this done every 1-3 years.  Bone density scan. This is done to screen for osteoporosis. You may have this done starting at age 35.  Mammogram. This may be done every 1-2 years. Talk to your health care provider about how often you should have  regular mammograms. Talk with your health care provider about your test results, treatment options, and if necessary, the need for more tests. Vaccines  Your health care provider may recommend certain vaccines, such as:  Influenza vaccine. This is recommended every year.  Tetanus, diphtheria, and acellular pertussis (Tdap, Td) vaccine. You may need a Td booster every 10 years.  Zoster vaccine. You may need this after age 67.  Pneumococcal 13-valent conjugate (PCV13) vaccine. One dose is recommended after age 29.  Pneumococcal polysaccharide (PPSV23) vaccine. One dose is recommended after age 75. Talk to your health care provider about which screenings and vaccines you need and how often you need them. This information is not intended to replace advice given to you by your health care provider. Make sure you discuss any questions you have with your health care provider. Document Released: 12/03/2015 Document Revised: 07/26/2016 Document Reviewed: 09/07/2015 Elsevier Interactive Patient Education  2017 [place] Prevention in the Home Falls can cause injuries. They can happen to people of all ages. There are many things you can do to make your home safe and to help prevent falls. What can I do on the outside of my home?  Regularly fix the edges of walkways and driveways and fix any cracks.  Remove anything that might make you trip as you walk through a door, such as a raised step or threshold.  Trim any bushes or trees on the path to your home.  Use bright outdoor lighting.  Clear any walking paths of anything that might make someone trip, such as rocks or tools.  Regularly check to see if handrails are loose or broken. Make sure that both sides of any steps have handrails.  Any raised decks and porches should have guardrails on the edges.  Have any leaves, snow, or ice cleared regularly.  Use sand or salt on walking paths during winter.  Clean up any spills in your  garage right away. This includes oil or grease spills. What can I do in the bathroom?  Use night lights.  Install grab bars by the toilet and in the tub and shower. Do not use towel bars as grab bars.  Use non-skid mats or decals in the tub or shower.  If you need to sit down in the shower, use a plastic, non-slip stool.  Keep the floor dry. Clean up any water that spills on the floor as soon as it happens.  Remove soap buildup in the tub or shower regularly.  Attach bath mats securely with double-sided non-slip rug tape.  Do not have throw rugs and other things on the floor that can make you trip. What can I do in the bedroom?  Use night lights.  Make sure that you have a light by your bed that is easy to reach.  Do not use any sheets or blankets that are too big for your bed. They should not hang down onto the floor.  Have a firm chair that has side arms. You can use this for support while you get  dressed.  Do not have throw rugs and other things on the floor that can make you trip. What can I do in the kitchen?  Clean up any spills right away.  Avoid walking on wet floors.  Keep items that you use a lot in easy-to-reach places.  If you need to reach something above you, use a strong step stool that has a grab bar.  Keep electrical cords out of the way.  Do not use floor polish or wax that makes floors slippery. If you must use wax, use non-skid floor wax.  Do not have throw rugs and other things on the floor that can make you trip. What can I do with my stairs?  Do not leave any items on the stairs.  Make sure that there are handrails on both sides of the stairs and use them. Fix handrails that are broken or loose. Make sure that handrails are as long as the stairways.  Check any carpeting to make sure that it is firmly attached to the stairs. Fix any carpet that is loose or worn.  Avoid having throw rugs at the top or bottom of the stairs. If you do have throw  rugs, attach them to the floor with carpet tape.  Make sure that you have a light switch at the top of the stairs and the bottom of the stairs. If you do not have them, ask someone to add them for you. What else can I do to help prevent falls?  Wear shoes that:  Do not have high heels.  Have rubber bottoms.  Are comfortable and fit you well.  Are closed at the toe. Do not wear sandals.  If you use a stepladder:  Make sure that it is fully opened. Do not climb a closed stepladder.  Make sure that both sides of the stepladder are locked into place.  Ask someone to hold it for you, if possible.  Clearly mark and make sure that you can see:  Any grab bars or handrails.  First and last steps.  Where the edge of each step is.  Use tools that help you move around (mobility aids) if they are needed. These include:  Canes.  Walkers.  Scooters.  Crutches.  Turn on the lights when you go into a dark area. Replace any light bulbs as soon as they burn out.  Set up your furniture so you have a clear path. Avoid moving your furniture around.  If any of your floors are uneven, fix them.  If there are any pets around you, be aware of where they are.  Review your medicines with your doctor. Some medicines can make you feel dizzy. This can increase your chance of falling. Ask your doctor what other things that you can do to help prevent falls. This information is not intended to replace advice given to you by your health care provider. Make sure you discuss any questions you have with your health care provider. Document Released: 09/02/2009 Document Revised: 04/13/2016 Document Reviewed: 12/11/2014 Elsevier Interactive Patient Education  2017 [employer].

## 2020-10-13 NOTE — Progress Notes (Addendum)
Subjective:   Erica Maldonado is a 69 y.o. female who presents for Medicare Annual (Subsequent) preventive examination.  Review of Systems    No ROS.  Medicare Wellness Virtual Visit.   Cardiac Risk Factors include: advanced age (>50men, >63 women)     Objective:    Today's Vitals   10/13/20 1231  Weight: 107 lb (48.5 kg)  Height: 5\' 2"  (1.575 m)   Body mass index is 19.57 kg/m.  Advanced Directives 10/13/2020 10/13/2019 10/08/2018  Does Patient Have a Medical Advance Directive? Yes Yes Yes  Type of Paramedic of [place];Living will [place];Living will [place];Living will  Does patient want to make changes to medical advance directive? No - Patient declined No - Patient declined No - Patient declined  Copy of [place] in Chart? No - copy requested Yes - validated most recent copy scanned in chart (See row information) Yes - validated most recent copy scanned in chart (See row information)    Current Medications (verified) Outpatient Encounter Medications as of 10/13/2020  Medication Sig   CALCIUM-MAGNESIUM-VITAMIN D PO Take 1 capsule by mouth 2 (two) times daily.   Cholecalciferol (VITAMIN D3 PO) Take 2,000 Units by mouth daily.   docusate sodium (COLACE) 100 MG capsule Take 100 mg by mouth 2 (two) times daily.   estradiol (ESTRACE VAGINAL) 0.1 MG/GM vaginal cream Place 1 Applicatorful vaginally 3 (three) times a week.   Magnesium Ascorbate POWD 125 mg daily by Does not apply route.   Nutritional Supplements (NUTRITIONAL SUPPLEMENT PO) Take 1 Dose by mouth daily as needed.   polyethylene glycol (MIRALAX / GLYCOLAX) 17 g packet Take 17 g by mouth. 1 cap 3 days a week   No facility-administered encounter medications on file as of 10/13/2020.    Allergies (verified) Amitiza [lubiprostone], Naprosyn [naproxen], Biaxin [clarithromycin], and Clarithromycin   History: Past Medical History:    Diagnosis Date   Abdominal or pelvic swelling, mass, or lump, left lower quadrant    Epistaxis    s/p cauterization   Fibroadenoma    right breast   Hemorrhoids, external, without mention of complication    Infectious diarrhea(009.2)    history of   Other bursitis disorders    Other chest pain    Scleroderma (HCC)    Sleep disturbance, unspecified    Umbilical hernia without mention of obstruction or gangrene    Past Surgical History:  Procedure Laterality Date   BREAST BIOPSY Right 1988   fibroadenoma removed,neg   BREAST BIOPSY Right 1995   Dr Bary Castilla, neg   COLONOSCOPY  2001, 2011   Dr Vira Agar   DILATION AND CURETTAGE OF UTERUS  2001   Dr Laurey Morale   HYSTEROSCOPY  1995   normal/ Dr Laurey Morale   Family History  Problem Relation Age of Onset   Breast cancer Mother 7   Cancer Mother        breast, developed uterine cancer while on tamoxifen   Cancer Brother        melanoma/scalp   Goiter Sister        Twin   Social History   Socioeconomic History   Marital status: Married    Spouse name: Not on file   Number of children: Not on file   Years of education: Not on file   Highest education level: Not on file  Occupational History   Occupation: nurse- full time  Tobacco Use   Smoking status: Never Smoker  Smokeless tobacco: Never Used   Tobacco comment: Remote history  Substance and Sexual Activity   Alcohol use: Yes    Comment: daily red wine   Drug use: No   Sexual activity: Not on file  Other Topics Concern   Not on file  Social History Narrative   Lives with spouse, daughter.    Has a cat.   Always uses seat belts.   Heavy exercise.   Gets annual pap smears by gyn, has annual mammograms at [facility].   Social Determinants of Health   Financial Resource Strain: Low Risk    Difficulty of Paying Living Expenses: Not hard at all  Food Insecurity: No Food Insecurity   Worried About Charity fundraiser in the Last Year: Never true   [place] in  the Last Year: Never true  Transportation Needs: No Transportation Needs   Lack of Transportation (Medical): No   Lack of Transportation (Non-Medical): No  Physical Activity: Sufficiently Active   Days of Exercise per Week: 4 days   Minutes of Exercise per Session: 60 min  Stress: No Stress Concern Present   Feeling of Stress : Not at all  Social Connections: Unknown   Frequency of Communication with Friends and Family: More than three times a week   Frequency of Social Gatherings with Friends and Family: More than three times a week   Attends Religious Services: Not on Electrical engineer or Organizations: Not on file   Attends Archivist Meetings: Not on file   Marital Status: Married    Tobacco Counseling Counseling given: Not Answered Comment: Remote history   Clinical Intake:  Pre-visit preparation completed: Yes        Diabetes: No  How often do you need to have someone help you when you read instructions, pamphlets, or other written materials from your doctor or pharmacy?: 1 - Never  Interpreter Needed?: No      Activities of Daily Living In your present state of health, do you have any difficulty performing the following activities: 10/13/2020  Hearing? N  Vision? N  Difficulty concentrating or making decisions? N  Walking or climbing stairs? N  Dressing or bathing? N  Doing errands, shopping? N  Preparing Food and eating ? N  Using the Toilet? N  In the past six months, have you accidently leaked urine? N  Do you have problems with loss of bowel control? N  Managing your Medications? N  Managing your Finances? N  Housekeeping or managing your Housekeeping? N  Some recent data might be hidden    Patient Care Team: Erica Mc, MD as PCP - General (Internal Medicine) Bary Castilla Forest Gleason, MD as Consulting Physician (General Surgery) Requested, Self  Indicate any recent Medical Services you may have received from other than  [facility] providers in the past year (date may be approximate).     Assessment:   This is a routine wellness examination for [place].  I connected with Erica Maldonado today by telephone and verified that I am speaking with the correct person using two identifiers. Location patient: home Location provider: work Persons participating in the virtual visit: patient, Marine scientist.    I discussed the limitations, risks, security and privacy concerns of performing an evaluation and management service by telephone and the availability of in person appointments. The patient expressed understanding and verbally consented to this telephonic visit.    Interactive audio and video telecommunications were attempted between this provider  and patient, however failed, due to patient having technical difficulties OR patient did not have access to video capability.  We continued and completed visit with audio only.  Some vital signs may be absent or patient reported.   Hearing/Vision screen  Hearing Screening   125Hz  250Hz  500Hz  1000Hz  2000Hz  3000Hz  4000Hz  6000Hz  8000Hz   Right ear:           Left ear:           Comments: Patient is able to hear conversational tones without difficulty.  No issues reported.  Vision Screening Comments: Wears corrective lenses Visual acuity not assessed, virtual visit.  They have seen their ophthalmologist in the last 12 months.     Dietary issues and exercise activities discussed: Current Exercise Habits: Structured exercise class, Type of exercise: calisthenics;strength training/weights;yoga (swimming, spin class), Time (Minutes): 60, Frequency (Times/Week): 4, Weekly Exercise (Minutes/Week): 240, Intensity: Moderate  Healthy diet Good fluid intake  Goals      Maintain healthy lifestyle     Stay active Healthy diet       Depression Screen PHQ 2/9 Scores 10/13/2020 10/13/2019 10/08/2018 09/28/2017 09/27/2016  PHQ - 2 Score 0 0 0 0 0  PHQ- 9 Score - - - 0 -    Fall Risk Fall Risk   10/13/2020 04/12/2020 10/13/2019 10/13/2019 03/18/2019  Falls in the past year? 0 0 0 0 0  Number falls in past yr: 0 - - 0 -  Injury with Fall? 0 - - - -  Risk for fall due to : - - - - -  Follow up Falls evaluation completed Falls evaluation completed Falls evaluation completed Falls prevention discussed;Education provided -   Handrails in use when climbing stairs? Yes Home free of loose throw rugs in walkways, pet beds, electrical cords, etc? Yes  Adequate lighting in your home to reduce risk of falls? Yes   ASSISTIVE DEVICES UTILIZED TO PREVENT FALLS: Use of a cane, walker or w/c? No   TIMED UP AND GO: Was the test performed? No . Virtual visit.   Cognitive Function: Patient is alert and oriented x3.  Denies difficulty focusing, making decisions, memory loss.  Enjoys learning a new foreign language, reading, and completing word search puzzles.  MMSE/6CIT deferred. Normal by direct communication/observation.  MMSE - Mini Mental State Exam 10/08/2018  Orientation to time 5  Orientation to Place 5  Registration 3  Attention/ Calculation 5  Recall 3  Language- name 2 objects 2  Language- repeat 1  Language- follow 3 step command 3  Language- read & follow direction 1  Write a sentence 1  Copy design 1  Total score 30     6CIT Screen 10/13/2019  What Year? 0 points  What month? 0 points  What time? 0 points  Count back from 20 0 points  Months in reverse 0 points  Repeat phrase 0 points  Total Score 0    Immunizations Immunization History  Administered Date(s) Administered   Influenza-Unspecified 08/27/2014, 09/17/2015, 08/08/2016, 07/31/2017, 08/01/2018, 09/18/2019   Td 08/26/2007   Tdap 10/04/2017   Pneumovax 23 vaccine- Advised may receive this vaccine at office visit, local pharmacy or Health Dept. Aware to provide a copy of the vaccination record if obtained from local pharmacy or Health Dept. Verbalized acceptance and understanding. Deferred.   Influenza  vaccine- plans to receive later in the season. Deferred.   Covid vaccine- discontinued per patient preference.   Health Maintenance Health Maintenance  Topic Date Due  INFLUENZA VACCINE  02/17/2021 (Originally 06/20/2020)   PNA vac Low Risk Adult (1 of 2 - PCV13) 10/13/2021 (Originally 11/11/2015)   MAMMOGRAM  10/01/2021   COLONOSCOPY  12/04/2021   TETANUS/TDAP  10/05/2027   DEXA SCAN  Completed   Hepatitis C Screening  Completed   COVID-19 Vaccine  Discontinued   Colorectal cancer screening: Completed 12/05/11. Repeat every 10 years   Mammogram status: Completed 10/21/19. Repeat every year. Appointment scheduled 10/20/20 @ 11:00.   Bone Density- 12/09/14. Repeat every 2 year. Osteopenia. Taking Vitamin D3 . Calcium. Deferred scheduling per patient preference.   Lung Cancer Screening: (Low Dose CT Chest recommended if Age 84-80 years, 30 pack-year currently smoking OR have quit w/in 15years.) does not qualify.   Hepatitis C Screening: Completed 01/12/16.  Vision Screening: Recommended annual ophthalmology exams for early detection of glaucoma and other disorders of the eye. Is the patient up to date with their annual eye exam?  Yes  Who is the provider or what is the name of the office in which the patient attends annual eye exams? Now visits with ophthamologist in [place].   Dental Screening: Recommended annual dental exams for proper oral hygiene. Visits every 6 months.  Community Resource Referral / Chronic Care Management: CRR required this visit?  No   CCM required this visit?  No      Plan:   Keep all routine maintenance appointments.   Mammogram- 10/20/20 @ 11:00  Cpe 10/20/20 @ 2:00  I have personally reviewed and noted the following in the patient's chart:   Medical and social history Use of alcohol, tobacco or illicit drugs  Current medications and supplements Functional ability and status Nutritional status Physical activity Advanced directives List of other  physicians Hospitalizations, surgeries, and ER visits in previous 12 months Vitals Screenings to include cognitive, depression, and falls Referrals and appointments  In addition, I have reviewed and discussed with patient certain preventive protocols, quality metrics, and best practice recommendations. A written personalized care plan for preventive services as well as general preventive health recommendations were provided to patient via mychart.     OBrien-Blaney, Heidie Krall L, LPN   [id number]/93/[id number]      I have reviewed the above information and agree with above.   Deborra Medina, MD

## 2020-10-20 ENCOUNTER — Ambulatory Visit
Admission: RE | Admit: 2020-10-20 | Discharge: 2020-10-20 | Disposition: A | Discharge status: Home / Self Care | Payer: Medicare Other | Source: Ambulatory Visit | Attending: Internal Medicine | Admitting: Internal Medicine

## 2020-10-20 ENCOUNTER — Encounter: Payer: Self-pay | Admitting: Internal Medicine

## 2020-10-20 ENCOUNTER — Other Ambulatory Visit: Payer: Self-pay

## 2020-10-20 ENCOUNTER — Ambulatory Visit (INDEPENDENT_AMBULATORY_CARE_PROVIDER_SITE_OTHER): Payer: Medicare Other | Admitting: Internal Medicine

## 2020-10-20 VITALS — BP 122/88 | HR 81 | Temp 98.5°F | Resp 14 | Ht 62.0 in | Wt 108.4 lb

## 2020-10-20 DIAGNOSIS — R202 Paresthesia of skin: Secondary | ICD-10-CM

## 2020-10-20 DIAGNOSIS — K5904 Chronic idiopathic constipation: Secondary | ICD-10-CM

## 2020-10-20 DIAGNOSIS — M8588 Other specified disorders of bone density and structure, other site: Secondary | ICD-10-CM

## 2020-10-20 DIAGNOSIS — R944 Abnormal results of kidney function studies: Secondary | ICD-10-CM

## 2020-10-20 DIAGNOSIS — Z23 Encounter for immunization: Secondary | ICD-10-CM

## 2020-10-20 DIAGNOSIS — E041 Nontoxic single thyroid nodule: Secondary | ICD-10-CM

## 2020-10-20 DIAGNOSIS — R200 Anesthesia of skin: Secondary | ICD-10-CM | POA: Diagnosis not present

## 2020-10-20 DIAGNOSIS — Z1231 Encounter for screening mammogram for malignant neoplasm of breast: Secondary | ICD-10-CM | POA: Diagnosis not present

## 2020-10-20 DIAGNOSIS — Z636 Dependent relative needing care at home: Secondary | ICD-10-CM | POA: Diagnosis not present

## 2020-10-20 DIAGNOSIS — E559 Vitamin D deficiency, unspecified: Secondary | ICD-10-CM | POA: Diagnosis not present

## 2020-10-20 IMAGING — MG DIGITAL SCREENING BILAT W/ TOMO W/ CAD
8 series · 9 of 24 positions shown · non-contrast
Comparison: Previous exam(s).

CLINICAL DATA: Screening.

EXAM:
DIGITAL SCREENING BILATERAL MAMMOGRAM WITH TOMO AND CAD

[R CC synth-2D]
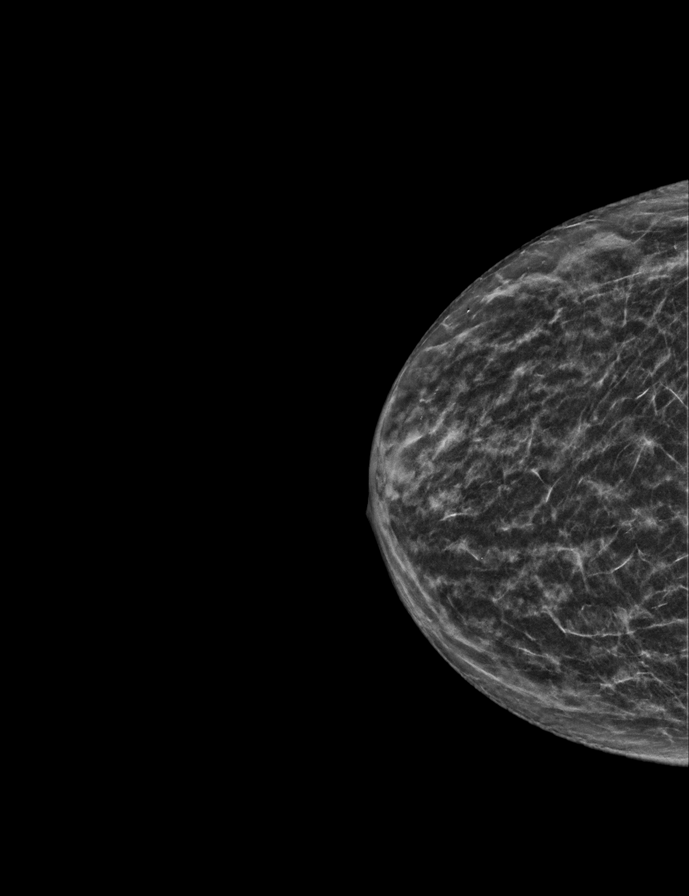

[L MLO synth-2D]
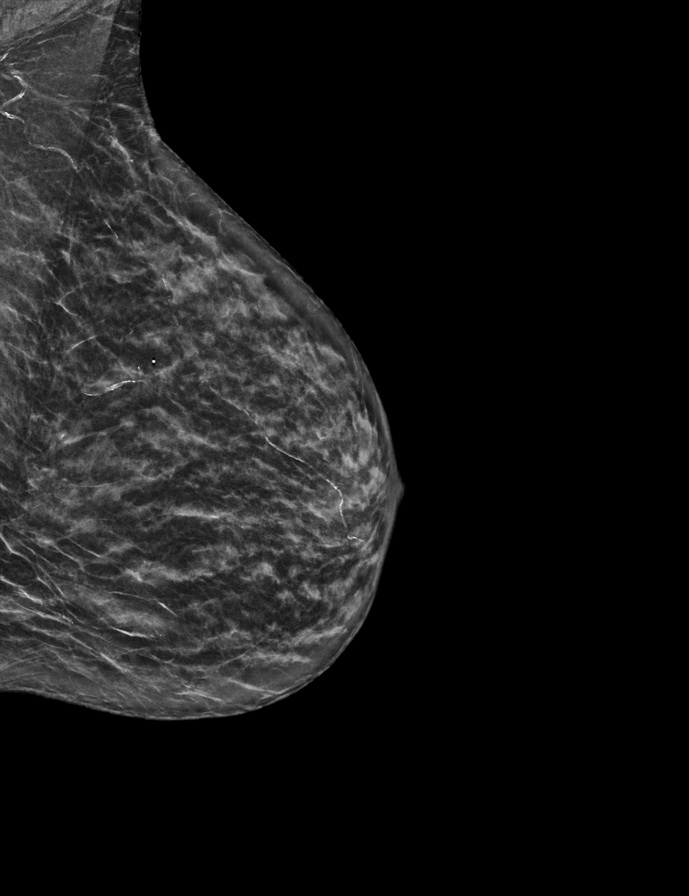

[R MLO synth-2D]
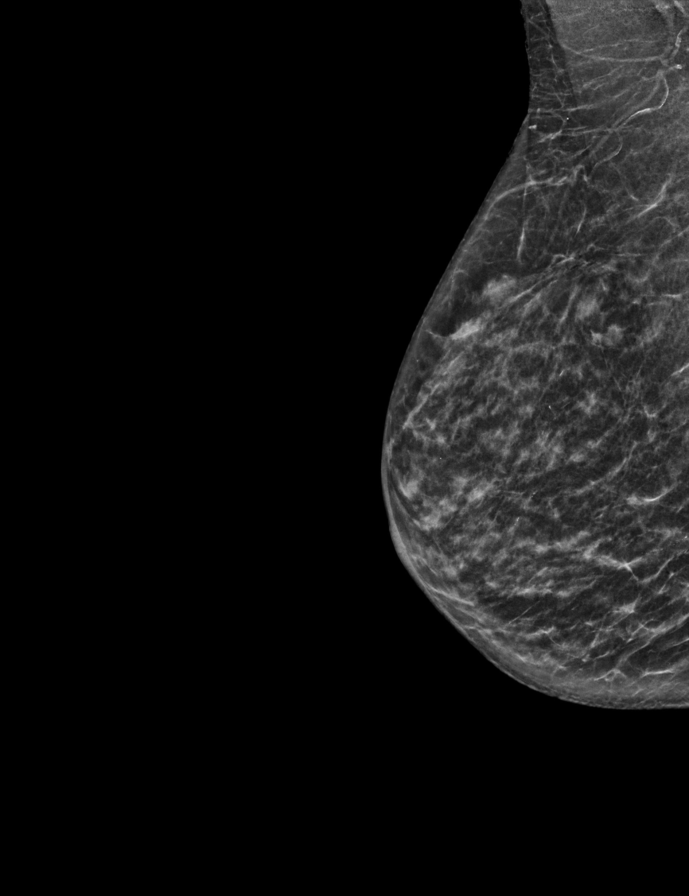

[L CC synth-2D]
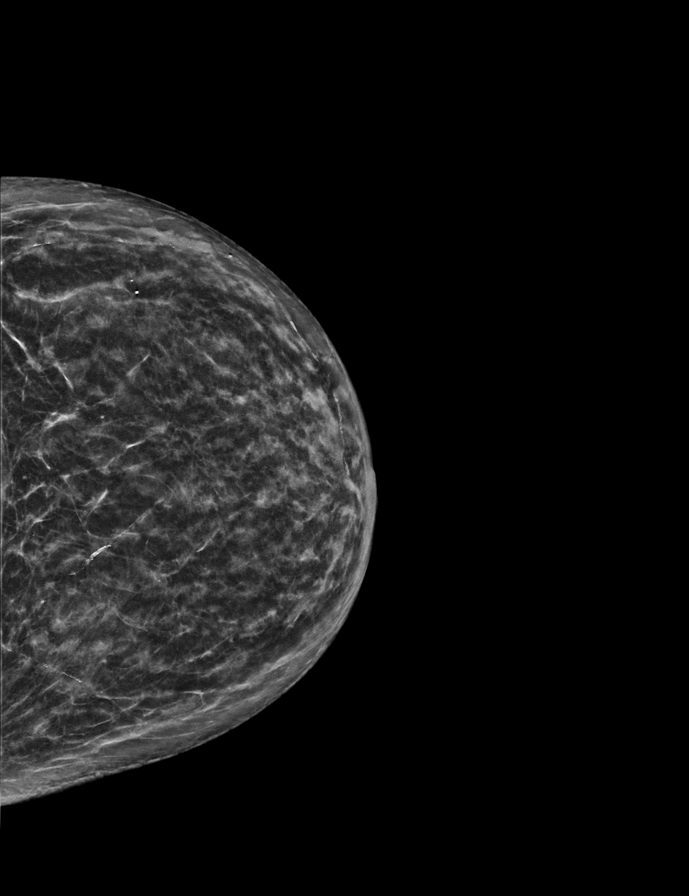

[R MLO tomo · 2 of 48 frames shown]
[frame 16/48]
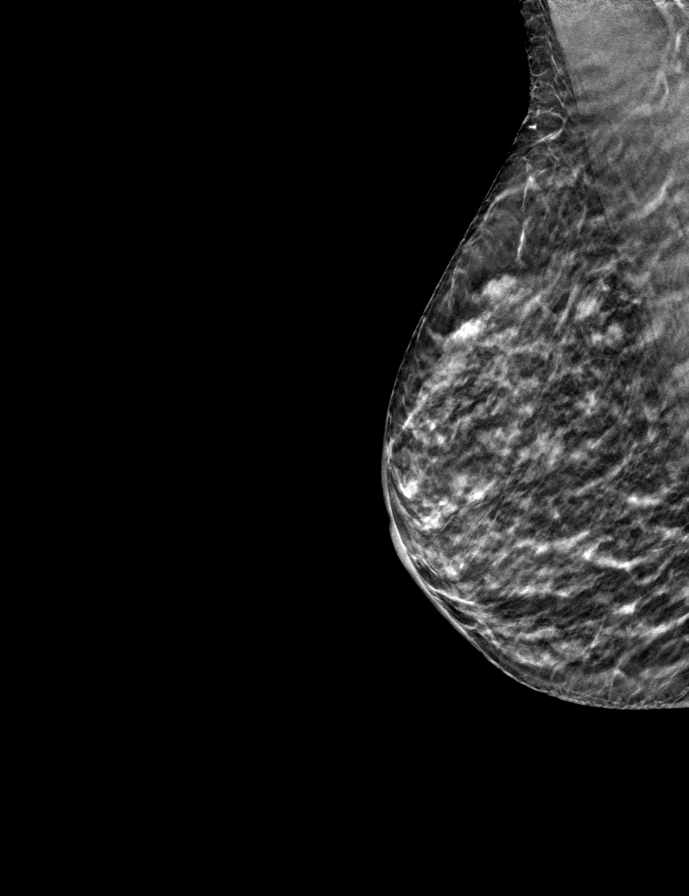
[frame 25/48]
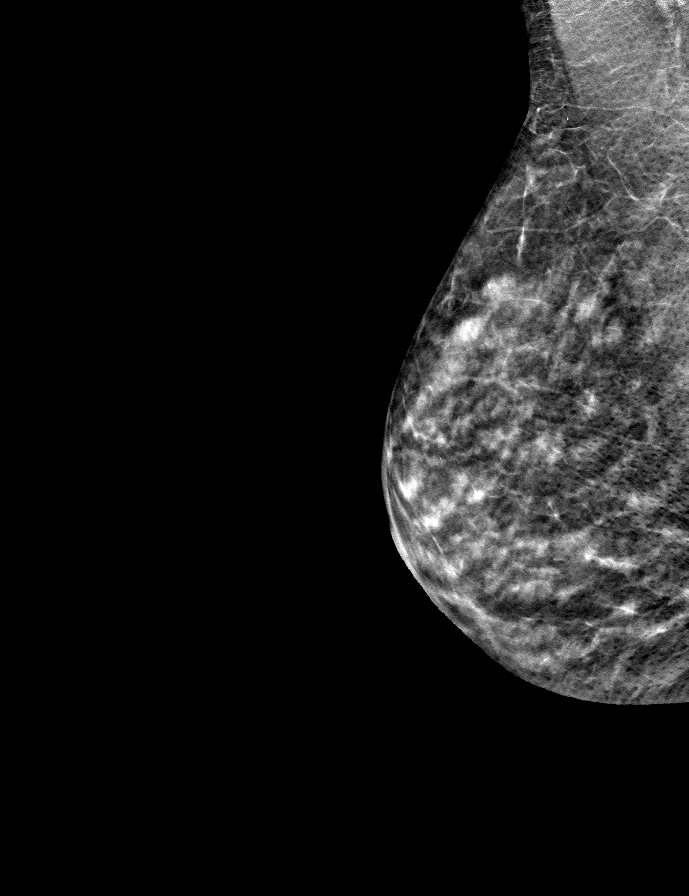

[R CC tomo · tomo slice 23/46.0]
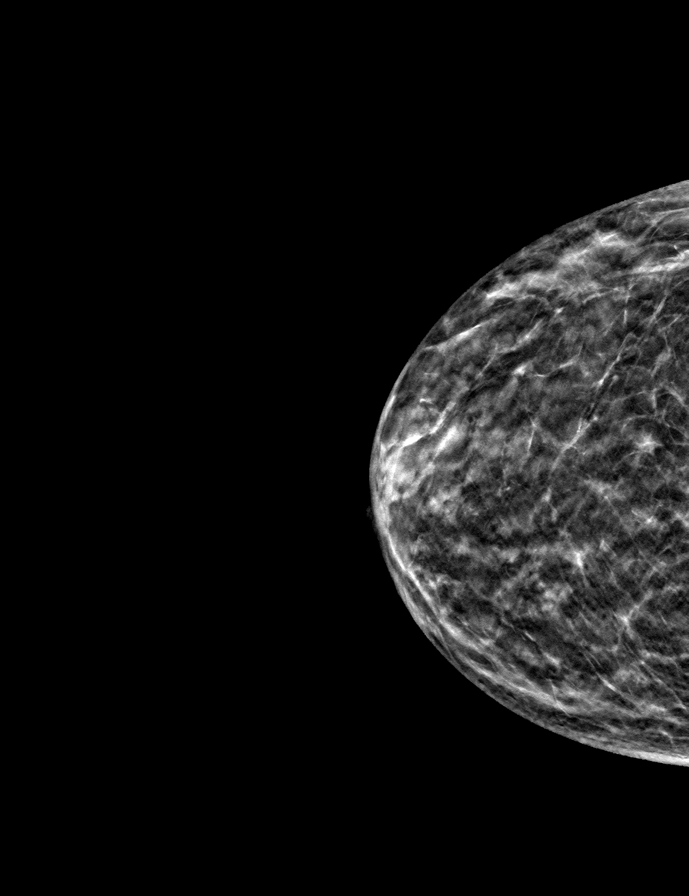

[L MLO tomo · tomo slice 25/49.0]
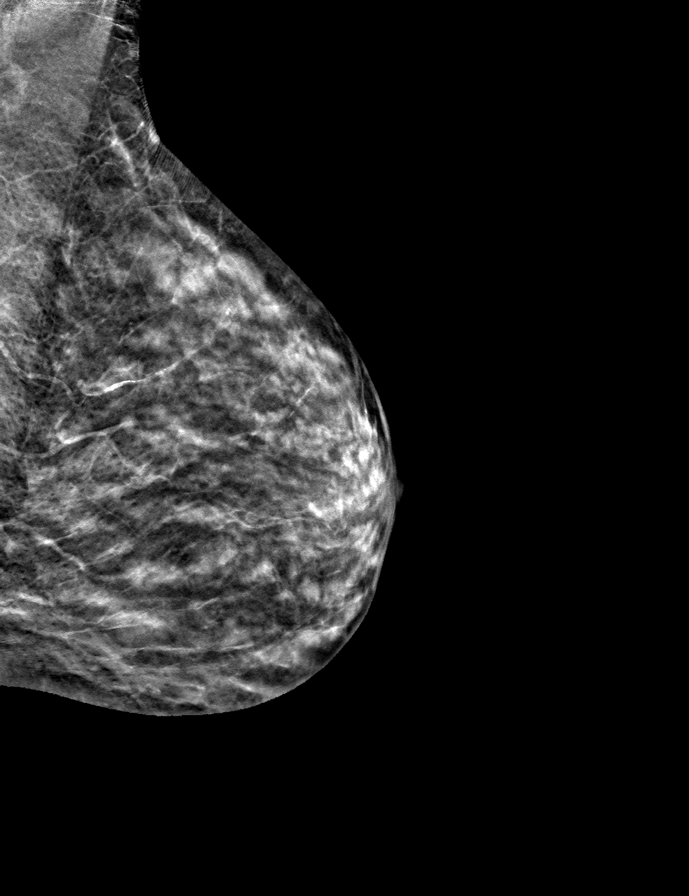

[L CC tomo · tomo slice 25/49.0]
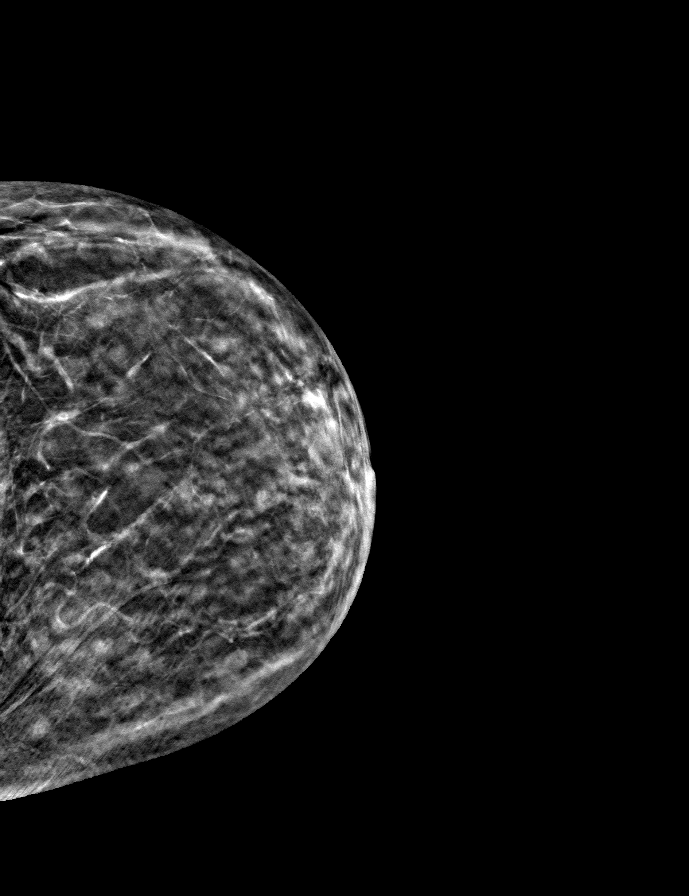

[9 of 24 positions shown; findings below may reference images not displayed]

ACR Breast Density Category c: The breast tissue is heterogeneously
dense, which may obscure small masses.
FINDINGS: There are no findings suspicious for malignancy. Images were
processed with CAD.
IMPRESSION: No mammographic evidence of malignancy. A result letter of this
screening mammogram will be mailed directly to the patient.

RECOMMENDATION:
Screening mammogram in one year. (Code:[5V])

BI-RADS CATEGORY  1: Negative.

## 2020-10-20 MED ORDER — ZOSTER VAC RECOMB ADJUVANTED 50 MCG/0.5ML IM SUSR
0.5000 mL | Freq: Once | INTRAMUSCULAR | 1 refills | Status: AC
Start: 2020-10-20 — End: 2020-10-20

## 2020-10-20 MED ORDER — ESTRADIOL 0.1 MG/GM VA CREA: 1.0000 | TOPICAL_CREAM | VAGINAL | 12 refills | Status: DC

## 2020-10-20 NOTE — Patient Instructions (Addendum)
Debrox for right ear cerumen impaction   3 drops 15 minutes before bedtime   [place]  Ball  In ear overnight to prevent pillow stain  Irrigate with water next day   Estrace has been refilled at the [facility] After Age 70 After age 82, you are at a higher risk for certain long-term diseases and infections as well as injuries from falls. Falls are a major cause of broken bones and head injuries in people who are older than age 67. Getting regular preventive care can help to keep you healthy and well. Preventive care includes getting regular testing and making lifestyle changes as recommended by your health care provider. Talk with your health care provider about:  Which screenings and tests you should have. A screening is a test that checks for a disease when you have no symptoms.  A diet and exercise plan that is right for you. What should I know about screenings and tests to prevent falls? Screening and testing are the best ways to find a health problem early. Early diagnosis and treatment give you the best chance of managing medical conditions that are common after age 87. Certain conditions and lifestyle choices may make you more likely to have a fall. Your health care provider may recommend:  Regular vision checks. Poor vision and conditions such as cataracts can make you more likely to have a fall. If you wear glasses, make sure to get your prescription updated if your vision changes.  Medicine review. Work with your health care provider to regularly review all of the medicines you are taking, including over-the-counter medicines. Ask your health care provider about any side effects that may make you more likely to have a fall. Tell your health care provider if any medicines that you take make you feel dizzy or sleepy.  Osteoporosis screening. Osteoporosis is a condition that causes the bones to get weaker. This can make the bones weak and cause them to break  more easily.  Blood pressure screening. Blood pressure changes and medicines to control blood pressure can make you feel dizzy.  Strength and balance checks. Your health care provider may recommend certain tests to check your strength and balance while standing, walking, or changing positions.  Foot health exam. Foot pain and numbness, as well as not wearing proper footwear, can make you more likely to have a fall.  Depression screening. You may be more likely to have a fall if you have a fear of falling, feel emotionally low, or feel unable to do activities that you used to do.  Alcohol use screening. Using too much alcohol can affect your balance and may make you more likely to have a fall. What actions can I take to lower my risk of falls? General instructions  Talk with your health care provider about your risks for falling. Tell your health care provider if: ? You fall. Be sure to tell your health care provider about all falls, even ones that seem minor. ? You feel dizzy, sleepy, or off-balance.  Take over-the-counter and prescription medicines only as told by your health care provider. These include any supplements.  Eat a healthy diet and maintain a healthy weight. A healthy diet includes low-fat dairy products, low-fat (lean) meats, and fiber from whole grains, beans, and lots of fruits and vegetables. Home safety  Remove any tripping hazards, such as rugs, cords, and clutter.  Install safety equipment such as grab bars in bathrooms and safety rails  on stairs.  Keep rooms and walkways well-lit. Activity   Follow a regular exercise program to stay fit. This will help you maintain your balance. Ask your health care provider what types of exercise are appropriate for you.  If you need a cane or walker, use it as recommended by your health care provider.  Wear supportive shoes that have nonskid soles. Lifestyle  Do not drink alcohol if your health care provider tells you not  to drink.  If you drink alcohol, limit how much you have: ? 0-1 drink a day for women. ? 0-2 drinks a day for men.  Be aware of how much alcohol is in your drink. In the [place] one drink equals one typical bottle of beer (12 oz), one-half glass of wine (5 oz), or one shot of hard liquor (1 oz).  Do not use any products that contain nicotine or tobacco, such as cigarettes and e-cigarettes. If you need help quitting, ask your health care provider. Summary  Having a healthy lifestyle and getting preventive care can help to protect your health and wellness after age 30.  Screening and testing are the best way to find a health problem early and help you avoid having a fall. Early diagnosis and treatment give you the best chance for managing medical conditions that are more common for people who are older than age 42.  Falls are a major cause of broken bones and head injuries in people who are older than age 103. Take precautions to prevent a fall at home.  Work with your health care provider to learn what changes you can make to improve your health and wellness and to prevent falls. This information is not intended to replace advice given to you by your health care provider. Make sure you discuss any questions you have with your health care provider. Document Revised: 02/27/2019 Document Reviewed: 09/19/2017 Elsevier Patient Education  2020 [employer].

## 2020-10-20 NOTE — Assessment & Plan Note (Signed)
Still present,  Not any worse.  Pins and needles. Intermittent.  No further workup desired at present

## 2020-10-20 NOTE — Assessment & Plan Note (Signed)
Has deferred treatment and surveillance DEXA due to family issues, and move to [place]  Last one was done in 2016.  Wants to defer DEXA for now.

## 2020-10-20 NOTE — Progress Notes (Signed)
Patient ID: Erica Maldonado, female    DOB: 05/16/1951  Age: 69 y.o. MRN: [id number]  The patient is here for follow up  and management of other chronic and acute problems.   The risk factors are reflected in the social history.  The roster of all physicians providing medical care to patient - is listed in the Snapshot section of the chart.  Activities of daily living:  The patient is 100% independent in all ADLs: dressing, toileting, feeding as well as independent mobility  Home safety : The patient has smoke detectors in the home. They wear seatbelts.  There are no firearms at home. There is no violence in the home.   There is no risks for hepatitis, STDs or HIV. There is no   history of blood transfusion. They have no travel history to infectious disease endemic areas of the world.  The patient has seen their dentist in the last six month. They have seen their eye doctor in the last year. They admit to slight hearing difficulty with regard to whispered voices and some television programs.  They have deferred audiologic testing in the last year.  They do not  have excessive sun exposure. Discussed the need for sun protection: hats, long sleeves and use of sunscreen if there is significant sun exposure.   Diet: the importance of a healthy diet is discussed. They do have a healthy diet.  The benefits of regular aerobic exercise were discussed. She walks 4 times per week ,  20 minutes.   Depression screen: there are no signs or vegative symptoms of depression- irritability, change in appetite, anhedonia, sadness/tearfullness.  Cognitive assessment: the patient manages all their financial and personal affairs and is actively engaged. They could relate day,date,year and events; recalled 3/3 objects at 3 minutes; performed clock-face test normally.  The following portions of the patient's history were reviewed and updated as appropriate: allergies, current medications, past family history, past  medical history,  past surgical history, past social history  and problem list.  Visual acuity was not assessed per patient preference since she has regular follow up with her ophthalmologist. Hearing and body mass index were assessed and reviewed.   During the course of the visit the patient was educated and counseled about appropriate screening and preventive services including : fall prevention , diabetes screening, nutrition counseling, colorectal cancer screening, and recommended immunizations.    CC: The primary encounter diagnosis was Vitamin D deficiency. Diagnoses of Numbness and tingling of both legs below knees, Osteopenia of spine, Need for immunization against influenza, Decreased calculated GFR, Encounter for screening mammogram for malignant neoplasm of breast, Caregiver burden, Thyroid nodule, and Functional constipation were also pertinent to this visit.  Grief and anger : father passed away with Hospice care at [facility] [place])  on Sept 30 from failure to thrive due to restrictions placed on his lifestyle  By the COVID 19 pandemic.   Still  angry at her brother for the way he handled things.     History Karliah has a past medical history of Abdominal or pelvic swelling, mass, or lump, left lower quadrant, Epistaxis, Fibroadenoma, Hemorrhoids, external, without mention of complication, Infectious diarrhea(009.2), Other bursitis disorders, Other chest pain, Scleroderma ([place]), Sleep disturbance, unspecified, and Umbilical hernia without mention of obstruction or gangrene.   She has a past surgical history that includes Hysteroscopy (1995); Dilation and curettage of uterus (2001); Colonoscopy (2001, 2011); Breast excisional biopsy (Right, 1988); and Breast biopsy (Right, 1995).   Her family history  includes Breast cancer (age of onset: 48) in her mother; Cancer in her brother and mother; Goiter in her sister.She reports that she has never smoked. She has never used smokeless tobacco. She  reports current alcohol use. She reports that she does not use drugs.  Outpatient Medications Prior to Visit  Medication Sig Dispense Refill  . CALCIUM-MAGNESIUM-VITAMIN D PO Take 1 capsule by mouth 2 (two) times daily.    . Cholecalciferol (VITAMIN D3 PO) Take 2,000 Units by mouth daily.    Marland Kitchen docusate sodium (COLACE) 100 MG capsule Take 100 mg by mouth 2 (two) times daily.    . Magnesium Ascorbate POWD 125 mg daily by Does not apply route.    . Nutritional Supplements (NUTRITIONAL SUPPLEMENT PO) Take 1 Dose by mouth daily as needed.    . polyethylene glycol (MIRALAX / GLYCOLAX) 17 g packet Take 17 g by mouth. 1 cap 3 days a week    . estradiol (ESTRACE VAGINAL) 0.1 MG/GM vaginal cream Place 1 Applicatorful vaginally 3 (three) times a week. 42.5 g 12   No facility-administered medications prior to visit.    Review of Systems  Objective:  BP 122/88 (BP Location: Left Arm, Patient Position: Sitting, Cuff Size: Normal)   Pulse 81   Temp 98.5 F (36.9 C) (Oral)   Resp 14   Ht 5\' 2"  (1.575 m)   Wt 108 lb 6.4 oz (49.2 kg)   SpO2 99%   BMI 19.83 kg/m   Physical Exam    Assessment & Plan:   Problem List Items Addressed This Visit      Unprioritized   Thyroid nodule    No further workup given stable appearance of the right upper pole nodule and disappearance of the isthmus nodule by 2020 [place] following a  benign biopsy.  thyroid function is normal.   Lab Results  Component Value Date   TSH 2.31 04/12/2020         Osteopenia    Has deferred treatment and surveillance DEXA due to family issues, and move to [place]  Last one was done in 2016.  Wants to defer DEXA for now.       Numbness and tingling of both legs below knees    Still present,  Not any worse.  Pins and needles. Intermittent.  No further workup desired at present       Relevant Orders   Comprehensive metabolic panel (Completed)   Functional constipation    Chronic, with recurrent imaging workups for bloating  noting significant stool burden . She is on an effective bowel regimen currently       Decreased calculated GFR    Review of the last 2 years of renal function shows a trending decline and gfr is now 58 ml/min.  She was not fasting.  Will repeat in 2 weeks after increasing hydration and suspending all NSAIDS.       Caregiver burden    Her passed away several months ago and she is no longer stressed out.       Breast cancer screening    Annual mammogram is normal Dec 2021       Other Visit Diagnoses    Vitamin D deficiency    -  Primary   Relevant Orders   VITAMIN D 25 Hydroxy (Vit-D Deficiency, Fractures) (Completed)   Need for immunization against influenza       Relevant Orders   Flu Vaccine QUAD 36+ mos IM (Completed)      I  am having [name] start on Zoster Vaccine Adjuvanted. I am also having her maintain her Magnesium Ascorbate, CALCIUM-MAGNESIUM-VITAMIN D PO, Nutritional Supplements (NUTRITIONAL SUPPLEMENT PO), polyethylene glycol, Cholecalciferol (VITAMIN D3 PO), docusate sodium, and estradiol.  Meds ordered this encounter  Medications  . estradiol (ESTRACE VAGINAL) 0.1 MG/GM vaginal cream    Sig: Place 1 Applicatorful vaginally 3 (three) times a week.    Dispense:  42.5 g    Refill:  12  . Zoster Vaccine Adjuvanted [facility]) injection    Sig: Inject 0.5 mLs into the muscle once for 1 dose.    Dispense:  1 each    Refill:  1   A total of 40 minutes was spent with patient more than half of which was spent in counseling patient on the above mentioned issues , reviewing and explaining recent labs and imaging studies done, and coordination of care.   Medications Discontinued During This Encounter  Medication Reason  . estradiol (ESTRACE VAGINAL) 0.1 MG/GM vaginal cream Reorder    Follow-up: No follow-ups on file.   Crecencio Mc, MD

## 2020-10-21 DIAGNOSIS — R944 Abnormal results of kidney function studies: Secondary | ICD-10-CM | POA: Insufficient documentation

## 2020-10-21 LAB — COMPREHENSIVE METABOLIC PANEL
ALT: 16 U/L (ref 0–35)
AST: 21 U/L (ref 0–37)
Albumin: 4.5 g/dL (ref 3.5–5.2)
Alkaline Phosphatase: 64 U/L (ref 39–117)
BUN: 18 mg/dL (ref 6–23)
CO2: 30 mEq/L (ref 19–32)
Calcium: 9.5 mg/dL (ref 8.4–10.5)
Chloride: 102 mEq/L (ref 96–112)
Creatinine, Ser: 0.99 mg/dL (ref 0.40–1.20)
GFR: 58 mL/min — ABNORMAL LOW (ref >60.00)
Glucose, Bld: 85 mg/dL (ref 70–99)
Potassium: 3.6 mEq/L (ref 3.5–5.1)
Sodium: 140 mEq/L (ref 135–145)
Total Bilirubin: 0.7 mg/dL (ref 0.2–1.2)
Total Protein: 6.9 g/dL (ref 6.0–8.3)

## 2020-10-21 LAB — VITAMIN D 25 HYDROXY (VIT D DEFICIENCY, FRACTURES): VITD: 46.4 ng/mL (ref 30.00–100.00)

## 2020-10-21 NOTE — Assessment & Plan Note (Signed)
Chronic, with recurrent imaging workups for bloating noting significant stool burden . She is on an effective bowel regimen currently

## 2020-10-21 NOTE — Assessment & Plan Note (Addendum)
No further workup given stable appearance of the right upper pole nodule and disappearance of the isthmus nodule by 2020 [place] following a  benign biopsy.  thyroid function is normal.   Lab Results  Component Value Date   TSH 2.31 04/12/2020

## 2020-10-21 NOTE — Progress Notes (Signed)
Marshell,  Your  mammogram was reported to me as normal.   Your labs are normal but your kidney function has been on a very subtle decline since 2019 and your GFR is now < 60 ml/min.      The slight drop in kidney function may be to decreased hydration tor due to use of NSAIDS.  I recommnd suspending any NSAIDS you are currently taking,,  increased your daily water intake,  and repeat in 2 weeks   Regards,   Deborra Medina, MD

## 2020-10-21 NOTE — Assessment & Plan Note (Signed)
Her passed away several months ago and she is no longer stressed out.

## 2020-10-21 NOTE — Assessment & Plan Note (Signed)
Annual mammogram is normal Dec 2021

## 2020-10-21 NOTE — Assessment & Plan Note (Signed)
Review of the last 2 years of renal function shows a trending decline and gfr is now 58 ml/min.  She was not fasting.  Will repeat in 2 weeks after increasing hydration and suspending all NSAIDS.

## 2020-10-24 DIAGNOSIS — N289 Disorder of kidney and ureter, unspecified: Secondary | ICD-10-CM

## 2020-10-26 NOTE — Telephone Encounter (Signed)
Pt called to schedule lab appt no labs in system

## 2020-10-27 NOTE — Telephone Encounter (Signed)
Lab order has been placed.  

## 2020-11-04 ENCOUNTER — Other Ambulatory Visit (INDEPENDENT_AMBULATORY_CARE_PROVIDER_SITE_OTHER): Payer: Medicare Other

## 2020-11-04 ENCOUNTER — Other Ambulatory Visit: Payer: Self-pay

## 2020-11-04 DIAGNOSIS — N289 Disorder of kidney and ureter, unspecified: Secondary | ICD-10-CM

## 2020-11-04 LAB — COMPREHENSIVE METABOLIC PANEL
ALT: 14 U/L (ref 0–35)
AST: 17 U/L (ref 0–37)
Albumin: 4.4 g/dL (ref 3.5–5.2)
Alkaline Phosphatase: 61 U/L (ref 39–117)
BUN: 14 mg/dL (ref 6–23)
CO2: 32 mEq/L (ref 19–32)
Calcium: 9.4 mg/dL (ref 8.4–10.5)
Chloride: 102 mEq/L (ref 96–112)
Creatinine, Ser: 0.84 mg/dL (ref 0.40–1.20)
GFR: 70.62 mL/min (ref >60.00)
Glucose, Bld: 132 mg/dL — ABNORMAL HIGH (ref 70–99)
Potassium: 3.5 mEq/L (ref 3.5–5.1)
Sodium: 140 mEq/L (ref 135–145)
Total Bilirubin: 0.6 mg/dL (ref 0.2–1.2)
Total Protein: 6.6 g/dL (ref 6.0–8.3)

## 2020-11-05 ENCOUNTER — Telehealth: Payer: Self-pay | Admitting: Internal Medicine

## 2020-11-26 ENCOUNTER — Ambulatory Visit: Payer: Medicare Other | Admitting: Neurology

## 2020-12-02 ENCOUNTER — Ambulatory Visit (INDEPENDENT_AMBULATORY_CARE_PROVIDER_SITE_OTHER): Payer: Medicare Other | Admitting: Neurology

## 2020-12-02 ENCOUNTER — Other Ambulatory Visit: Payer: Self-pay

## 2020-12-02 ENCOUNTER — Encounter: Payer: Self-pay | Admitting: Neurology

## 2020-12-02 VITALS — BP 114/80 | HR 82 | Ht 62.0 in | Wt 110.2 lb

## 2020-12-02 DIAGNOSIS — R202 Paresthesia of skin: Secondary | ICD-10-CM

## 2020-12-02 DIAGNOSIS — R292 Abnormal reflex: Secondary | ICD-10-CM | POA: Diagnosis not present

## 2020-12-02 DIAGNOSIS — R200 Anesthesia of skin: Secondary | ICD-10-CM

## 2020-12-02 NOTE — Progress Notes (Signed)
Erica Maldonado   Date: 12/02/20  Erica Maldonado MRN: [id number] DOB: 17-Mar-1951   Dear Dr. Derrel Nip:  Thank you for your kind referral of Erica Maldonado for consultation of neuropathy. Although her history is well known to you, please allow [place] to reiterate it for the purpose of our medical record. The patient was accompanied to the clinic by self.    History of Present Illness: Erica Maldonado is a 70 y.o. right-handed female with no significant prior history presenting for evaluation of bilateral feet tingling.    Several years ago, she was having numbness/tingling in the lower legs and was found to have folate deficiency and improved with supplementation, so felt symptoms were related to that.  Over the past year, she began having increased numbness/tingling in the toes, soles of the feet, and a little over the tops of the feet.  It is worse when exercising and evening.  Symptoms do not prevent her for daily activities or sleeping at night.   She denies weakness or falls.  Her balance is good. She walks unassisted.  Her labs from May 2021 shows normal HbA1c, vitamin B12, TSH, and folate.   Her father, brother, and sister also have similar symptoms.  She drinks 2-4 oz of wine nightly with dinner for many years, starting in her 89s.  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 Abdominal or pelvic swelling, mass, or lump, left lower quadrant   . Epistaxis    s/p cauterization  . Fibroadenoma    right breast   . Hemorrhoids, external, without mention of complication   . Infectious diarrhea(009.2)    history of  . Other bursitis disorders   . Other chest pain   . Sleep disturbance, unspecified   . Umbilical hernia without mention of obstruction or gangrene     Past Surgical History:  Procedure Laterality Date  . BREAST BIOPSY Right 1995   Dr Bary Castilla, neg  . BREAST EXCISIONAL BIOPSY Right 1988   fibroadenoma removed,neg  . COLONOSCOPY  2001, 2011   Dr Vira Agar  . DILATION AND CURETTAGE OF UTERUS  2001   Dr Laurey Morale  . HYSTEROSCOPY  1995   normal/ Dr Laurey Morale     Medications:  Outpatient Encounter Medications as of 12/02/2020  Medication Sig  . CALCIUM-MAGNESIUM-VITAMIN D PO Take 1 capsule by mouth 2 (two) times daily.  . Cholecalciferol (VITAMIN D3 PO) Take 2,000 Units by mouth daily.  Marland Kitchen estradiol (ESTRACE VAGINAL) 0.1 MG/GM vaginal cream Place 1 Applicatorful vaginally 3 (three) times a week.  . Magnesium Ascorbate POWD 125 mg daily by Does not apply route.  . Nutritional Supplements (NUTRITIONAL SUPPLEMENT PO) Take 1 Dose by mouth daily as needed.  . docusate sodium (COLACE) 100 MG capsule Take 100 mg by mouth 2 (two) times daily. (Patient not taking: Reported on 12/02/2020)  . polyethylene glycol (MIRALAX / GLYCOLAX) 17 g packet Take 17 g by mouth. 1 cap 3 days a week (Patient not taking: Reported  on 12/02/2020)   No facility-administered encounter medications on file as of 12/02/2020.    Allergies:  Allergies  Allergen Reactions  . Amitiza [Lubiprostone] Shortness Of Breath  . Naprosyn [Naproxen] Swelling    Throat felt like it was closing  . Biaxin [Clarithromycin] Other (See Comments)    Tongue blisters  . Clarithromycin Rash    Blisters on tongue    Family History: Family History  Problem Relation Age of Onset  . Breast cancer Mother 73  . Cancer Mother        breast, developed uterine cancer while on tamoxifen  . Cancer Brother        melanoma/scalp    Social  History: Social History   Tobacco Use  . Smoking status: Never Smoker  . Smokeless tobacco: Never Used  . Tobacco comment: Remote history  Substance Use Topics  . Alcohol use: Yes    Comment: daily red wine  . Drug use: No   Social History   Social History Narrative   Lives with spouse, daughter.    Has a cat.   Always uses seat belts.   Heavy exercise.   Gets annual pap smears by gyn, has annual mammograms at [place].      Right handed    Vital Signs:  BP 114/80   Pulse 82   Ht 5\' 2"  (1.575 m)   Wt 110 lb 3.2 oz (50 kg)   SpO2 99%   BMI 20.16 kg/m   Neurological Exam: MENTAL STATUS including orientation to time, place, person, recent and remote memory, attention span and concentration, language, and fund of knowledge is normal.  Speech is not dysarthric.  CRANIAL NERVES: II:  No visual field defects.     III-IV-VI: Pupils equal round and reactive to light.  Normal conjugate, extra-ocular eye movements in all directions of gaze.  No nystagmus.  No ptosis.   V:  Normal facial sensation.    VII:  Normal facial symmetry and movements.   VIII:  Normal hearing and vestibular function.   IX-X:  Normal palatal movement.   XI:  Normal shoulder shrug and head rotation.   XII:  Normal tongue strength and range of motion, no deviation or fasciculation.  MOTOR:  No atrophy, fasciculations or abnormal movements.  No pronator drift.   Upper Extremity:  Right  Left  Deltoid  5/5   5/5   Biceps  5/5   5/5   Triceps  5/5   5/5   Infraspinatus 5/5  5/5  Medial pectoralis 5/5  5/5  Wrist extensors  5/5   5/5   Wrist flexors  5/5   5/5   Finger extensors  5/5   5/5   Finger flexors  5/5   5/5   Dorsal interossei  5/5   5/5   Abductor pollicis  5/5   5/5   Tone (Ashworth scale)  0  0   Lower Extremity:  Right  Left  Hip flexors  5/5   5/5   Hip extensors  5/5   5/5   Adductor 5/5  5/5  Abductor 5/5  5/5  Knee flexors  5/5   5/5   Knee extensors  5/5   5/5    Dorsiflexors  5/5   5/5   Plantarflexors  5/5   5/5   Toe extensors  5/5   5/5   Toe flexors  5/5   5/5   Tone (Ashworth scale)  0  0   MSRs:  Right        Left                  brachioradialis 2+  2+  biceps 2+  2+  triceps 2+  2+  patellar 3+  3+  ankle jerk 2+  2+  Hoffman no  no  plantar response down  down   SENSORY:  Normal and symmetric perception of light touch, pinprick, vibration, and proprioception.  Romberg's sign absent.   COORDINATION/GAIT: Normal finger-to- nose-finger and heel-to-shin.  Intact rapid alternating movements bilaterally.  Able to rise from a chair without using arms.  Gait narrow based and stable. Tandem and stressed gait intact.    IMPRESSION: Bilateral feet paresthesias.  Ddx: neuropathy vs lumbosacral radiculopathy/canal stenosis.  Exam shows brisk reflexes in the legs, which is atypical for neuropathy and seen with canal stenosis, however history tends to lean more towards neuropathy than lumbosacral disease.  Given her inconsistent history and exam, the next step his NCS/EMG of the legs.  Consider imaging of the lumbar spine, if EDX is negative and symptoms progress.   Fortunately, symptoms are not bothersome enough to warrant neuralgesic medication, and more of an annoyance.   Further recommendations pending results.   Thank you for allowing me to participate in patient's care.  If I can answer any additional questions, I would be pleased to do so.    Sincerely,    Erica Alen K. Posey Pronto, DO

## 2020-12-02 NOTE — Patient Instructions (Signed)
Nerve testing of the legs.  Do not apply lotion or oil to your legs and feet on the day of testing.  ELECTROMYOGRAM AND NERVE CONDUCTION STUDIES (EMG/NCS) INSTRUCTIONS  How to Prepare The neurologist conducting the EMG will need to know if you have certain medical conditions. Tell the neurologist and other EMG lab personnel if you: . Have a pacemaker or any other electrical medical device . Take blood-thinning medications . Have hemophilia, a blood-clotting disorder that causes prolonged bleeding Bathing Take a shower or bath shortly before your exam in order to remove oils from your skin. Don't apply lotions or creams before the exam.  What to Expect You'll likely be asked to change into a hospital gown for the procedure and lie down on an examination table. The following explanations can help you understand what will happen during the exam.  . Electrodes. The neurologist or a technician places surface electrodes at various locations on your skin depending on where you're experiencing symptoms. Or the neurologist may insert needle electrodes at different sites depending on your symptoms.  . Sensations. The electrodes will at times transmit a tiny electrical current that you may feel as a twinge or spasm. The needle electrode may cause discomfort or pain that usually ends shortly after the needle is removed. If you are concerned about discomfort or pain, you may want to talk to the neurologist about taking a short break during the exam.  . Instructions. During the needle EMG, the neurologist will assess whether there is any spontaneous electrical activity when the muscle is at rest - activity that isn't present in healthy muscle tissue - and the degree of activity when you slightly contract the muscle.  He or she will give you instructions on resting and contracting a muscle at appropriate times. Depending on what muscles and nerves the neurologist is examining, he or she may ask you to change  positions during the exam.  After your EMG You may experience some temporary, minor bruising where the needle electrode was inserted into your muscle. This bruising should fade within several days. If it persists, contact your primary care doctor.

## 2020-12-03 ENCOUNTER — Ambulatory Visit: Payer: Medicare Other | Admitting: Neurology

## 2021-02-16 ENCOUNTER — Other Ambulatory Visit: Payer: Self-pay

## 2021-02-16 ENCOUNTER — Ambulatory Visit (INDEPENDENT_AMBULATORY_CARE_PROVIDER_SITE_OTHER): Payer: Medicare Other | Admitting: Neurology

## 2021-02-16 ENCOUNTER — Telehealth: Payer: Self-pay

## 2021-02-16 DIAGNOSIS — R200 Anesthesia of skin: Secondary | ICD-10-CM

## 2021-02-16 DIAGNOSIS — R292 Abnormal reflex: Secondary | ICD-10-CM

## 2021-02-16 DIAGNOSIS — R202 Paresthesia of skin: Secondary | ICD-10-CM | POA: Diagnosis not present

## 2021-02-16 NOTE — Telephone Encounter (Signed)
Called and informed patient of results and recommendations. Patient is agreeable to having the MRI done and would like her MRI order sent to [facility] regional in [place]. Patient is aware a PA will be done first prior to being scheduled. Patient had no other questions or concerns.   MRI has been ordered and sent to [facility] regional as requested.

## 2021-02-16 NOTE — Telephone Encounter (Signed)
-----   Message from Alda Berthold, DO sent at 02/16/2021  1:40 PM EDT ----- Please let pt know nerve testing is normal, no signs of neuropathy.  Next step is getting a MRI lumbar spine, if she is agreeable, please order. Thanks.  Use dx: hyperreflexia, bilateral feet numbness. Thanks.

## 2021-02-16 NOTE — Procedures (Signed)
[facility]  [place], [place]  [place], [place] [address] Tel: [phone] Fax:  [phone] Test Date:  02/16/2021  Patient: Erica Maldonado DOB: 08-18-1951 Physician: Narda Amber, DO  Sex: Female Height: 5\' 2"  Ref Phys: Narda Amber, DO  ID#: [id number]   Technician:    Patient Complaints: This is a 70 year old female referred for evaluation of bilateral feet numbness and tingling.  NCV & EMG Findings: Electrodiagnostic testing of the right lower extremity and additional studies of the left shows: 1. Bilateral sural and superficial peroneal sensory responses are within normal limits. 2. Bilateral peroneal and tibial motor responses are within normal limits. 3. Bilateral tibial H reflex studies are within normal limits. 4. There is no evidence of active or chronic motor axonal changes affecting any of the tested muscles.  Motor unit configuration and recruitment pattern is within normal limits.  Impression: This is a normal study of the lower extremities.  In particular, there is no evidence of a sensorimotor polyneuropathy or lumbosacral radiculopathy.    ___________________________ Narda Amber, DO    Nerve Conduction Studies Anti Sensory Summary Table   Stim Site NR Peak (ms) Norm Peak (ms) P-T Amp (V) Norm P-T Amp  Left Sup Peroneal Anti Sensory (Ant Lat Mall)  32C  12 cm    2.7 <4.6 12.8 >3  Right Sup Peroneal Anti Sensory (Ant Lat Mall)  32C  12 cm    3.3 <4.6 9.0 >3  Left Sural Anti Sensory (Lat Mall)  32C  Calf    3.4 <4.6 23.1 >3  Right Sural Anti Sensory (Lat Mall)  32C  Calf    3.8 <4.6 17.5 >3   Motor Summary Table   Stim Site NR Onset (ms) Norm Onset (ms) O-P Amp (mV) Norm O-P Amp Site1 Site2 Delta-0 (ms) Dist (cm) Vel (m/s) Norm Vel (m/s)  Left Peroneal Motor (Ext Dig Brev)  32C  Ankle    5.9 <6.0 4.0 >2.5 B Fib Ankle 6.3 34.0 54 >40  B Fib    12.2  3.5  Poplt B Fib 1.6 8.0 50 >40  Poplt    13.8  3.4         Right Peroneal  Motor (Ext Dig Brev)  32C  Ankle    5.7 <6.0 3.6 >2.5 B Fib Ankle 7.7 35.0 45 >40  B Fib    13.4  3.4  Poplt B Fib 1.5 8.0 53 >40  Poplt    14.9  3.4         Left Tibial Motor (Abd Hall Brev)  32C  Ankle    5.1 <6.0 9.5 >4 Knee Ankle 8.5 38.0 45 >40  Knee    13.6  6.0         Right Tibial Motor (Abd Hall Brev)  32C  Ankle    4.1 <6.0 9.7 >4 Knee Ankle 8.2 39.0 48 >40  Knee    12.3  6.8          H Reflex Studies   NR H-Lat (ms) Lat Norm (ms) L-R H-Lat (ms)  Left Tibial (Gastroc)  32C     31.56 <35 2.04  Right Tibial (Gastroc)  32C     29.52 <35 2.04   EMG   Side Muscle Ins Act Fibs Psw Fasc Number Recrt Dur Dur. Amp Amp. Poly Poly. Comment  Right AntTibialis Nml Nml Nml Nml Nml Nml Nml Nml Nml Nml Nml Nml N/A  Right Gastroc Nml Nml Nml Nml Nml Nml  Nml Nml Nml Nml Nml Nml N/A  Right Flex Dig Long Nml Nml Nml Nml Nml Nml Nml Nml Nml Nml Nml Nml N/A  Right RectFemoris Nml Nml Nml Nml Nml Nml Nml Nml Nml Nml Nml Nml N/A  Right BicepsFemS Nml Nml Nml Nml Nml Nml Nml Nml Nml Nml Nml Nml N/A  Left BicepsFemS Nml Nml Nml Nml Nml Nml Nml Nml Nml Nml Nml Nml N/A  Left AntTibialis Nml Nml Nml Nml Nml Nml Nml Nml Nml Nml Nml Nml N/A  Left Gastroc Nml Nml Nml Nml Nml Nml Nml Nml Nml Nml Nml Nml N/A  Left Flex Dig Long Nml Nml Nml Nml Nml Nml Nml Nml Nml Nml Nml Nml N/A  Left RectFemoris Nml Nml Nml Nml Nml Nml Nml Nml Nml Nml Nml Nml N/A      Waveforms:

## 2021-03-07 ENCOUNTER — Telehealth: Payer: Self-pay | Admitting: Internal Medicine

## 2021-03-07 ENCOUNTER — Ambulatory Visit: Payer: Medicare Other

## 2021-03-07 DIAGNOSIS — M810 Age-related osteoporosis without current pathological fracture: Secondary | ICD-10-CM

## 2021-03-07 NOTE — Telephone Encounter (Signed)
PT called in needing a Dexa Scan order sent in to Fax # [phone]

## 2021-03-07 NOTE — Telephone Encounter (Signed)
Ordered has been printed and placed in quick sign folder.

## 2021-03-14 ENCOUNTER — Telehealth: Payer: Self-pay

## 2021-03-14 ENCOUNTER — Other Ambulatory Visit: Payer: Self-pay

## 2021-03-14 ENCOUNTER — Ambulatory Visit
Admission: RE | Admit: 2021-03-14 | Discharge: 2021-03-14 | Disposition: A | Discharge status: Home / Self Care | Payer: Medicare Other | Source: Ambulatory Visit | Attending: Neurology | Admitting: Neurology

## 2021-03-14 DIAGNOSIS — R202 Paresthesia of skin: Secondary | ICD-10-CM | POA: Diagnosis present

## 2021-03-14 DIAGNOSIS — R292 Abnormal reflex: Secondary | ICD-10-CM | POA: Diagnosis present

## 2021-03-14 DIAGNOSIS — R200 Anesthesia of skin: Secondary | ICD-10-CM | POA: Insufficient documentation

## 2021-03-14 IMAGING — MR MR LUMBAR SPINE W/O CM
5 series · 31 of 48 positions shown · non-contrast
Comparison: None.

CLINICAL DATA: Low back pain with left flank pain. Bilateral foot
numbness. Hyper reflexia

EXAM:
MRI LUMBAR SPINE WITHOUT CONTRAST
TECHNIQUE: Multiplanar, multisequence MR imaging of the lumbar spine was
performed. No intravenous contrast was administered.

[Series 9: T2 · sagittal · 4.0mm · 0.81mm/px · 6 of 17 slices shown (1 of 2)]
[im 1/17]
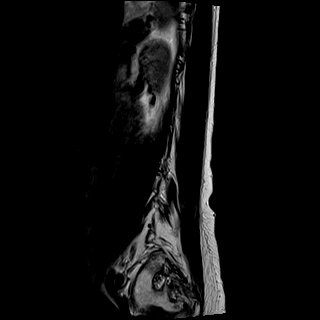
[im 4/17]
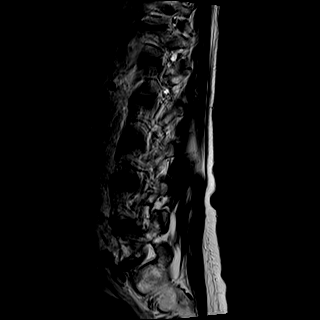
[im 7/17]
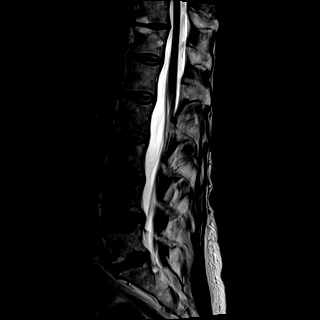
[im 10/17]
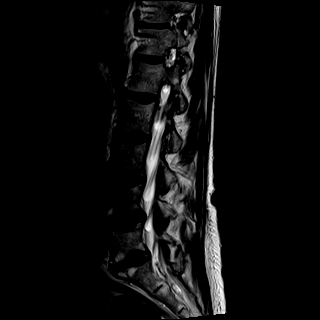
[im 13/17]
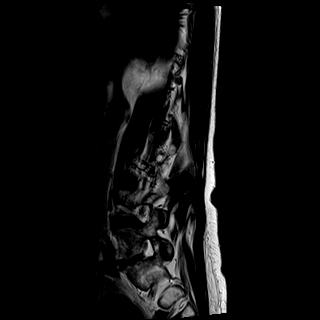
[im 17/17]
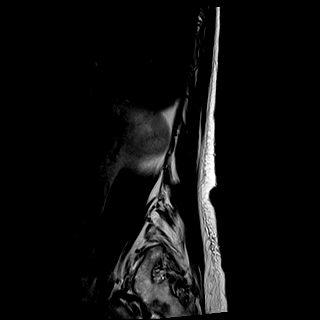

[Series 10: T1 · sagittal · 4.0mm · 0.81mm/px · 7 of 17 slices shown (1 of 2)]
[im 1/17]
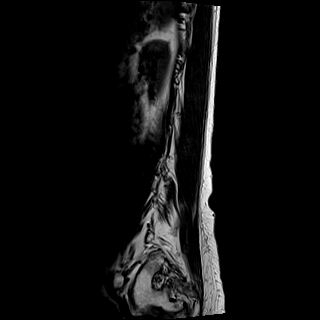
[im 3/17]
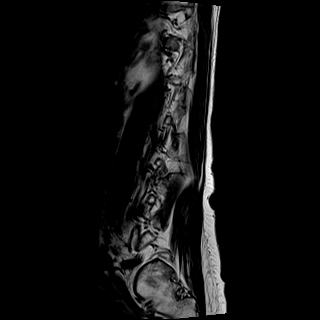
[im 6/17]
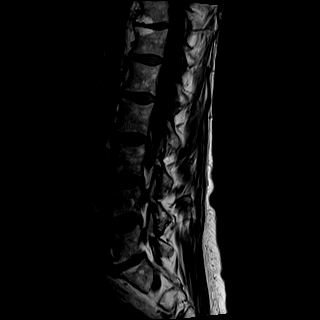
[im 9/17]
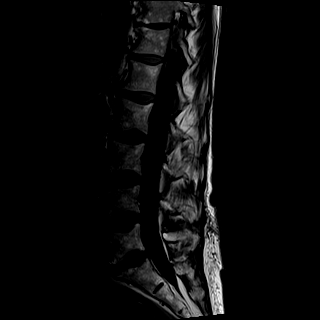
[im 11/17]
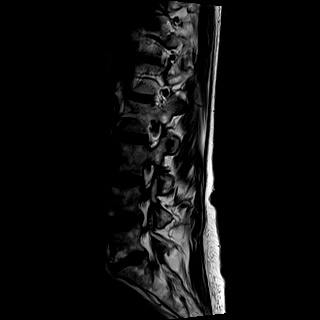
[im 14/17]
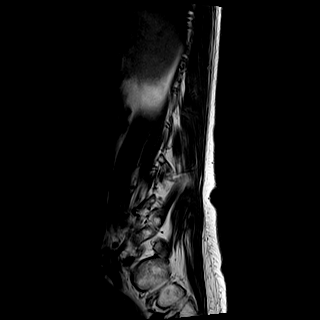
[im 17/17]
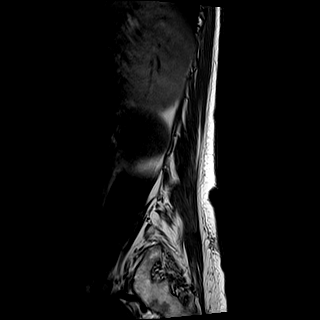

[Series 11: STIR · sagittal · 4.0mm · 0.41mm/px · 2 of 17 slices shown]
[im 1/17]
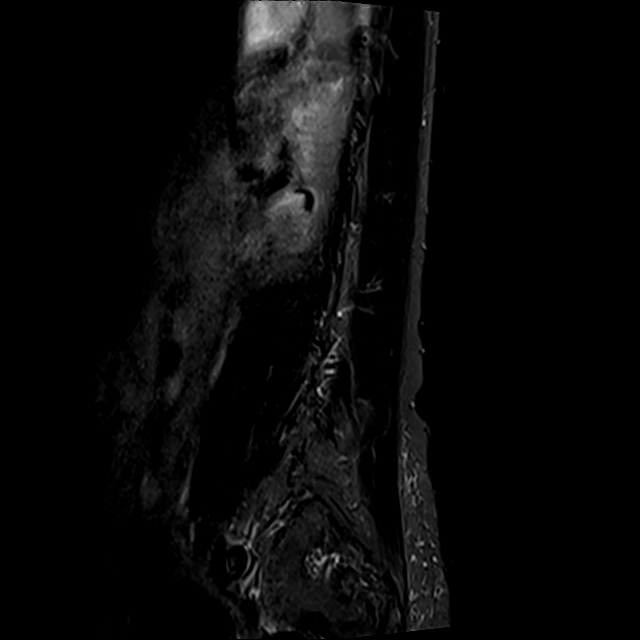
[im 3/17]
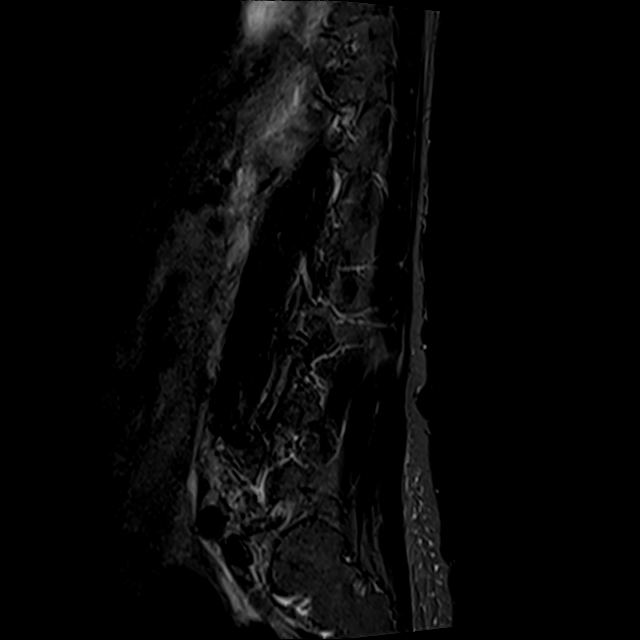

[Series 12: T2 · axial · 4.0mm · 0.78mm/px · z∈[-251,-47]mm · 8 of 36 slices shown (2 of 2)]
[im 1/36]
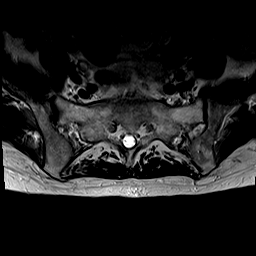
[im 6/36]
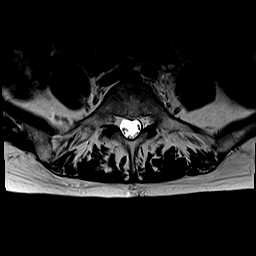
[im 11/36]
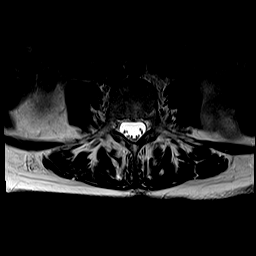
[im 17/36]
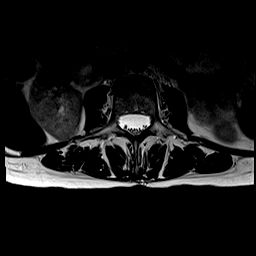
[im 19/36]
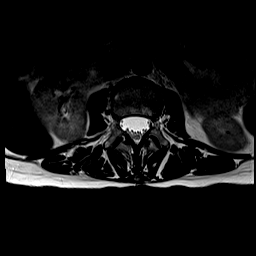
[im 25/36]
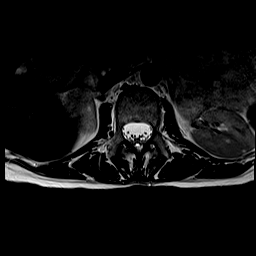
[im 30/36]
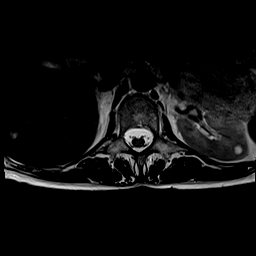
[im 36/36]
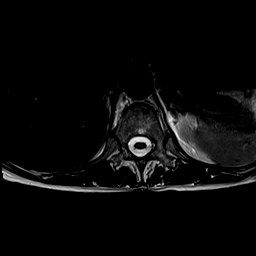

[Series 13: T1 · axial · 4.0mm · 0.39mm/px · z∈[-251,-47]mm · 8 of 36 slices shown (2 of 2)]
[im 1/36]
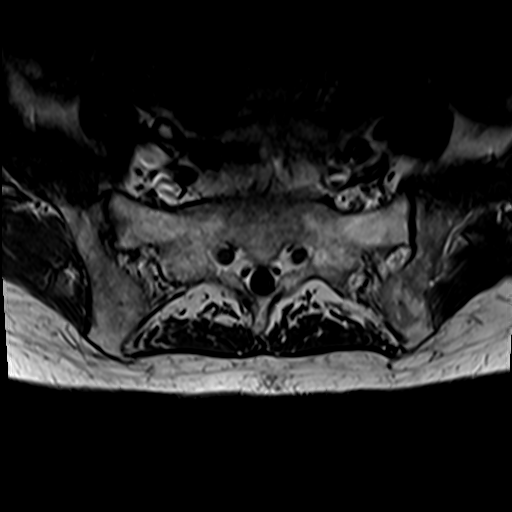
[im 6/36]
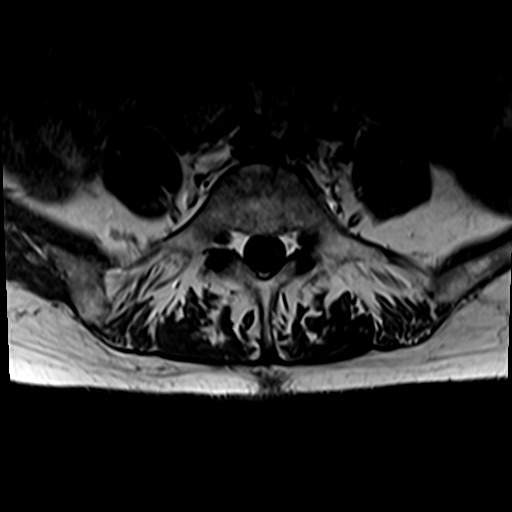
[im 11/36]
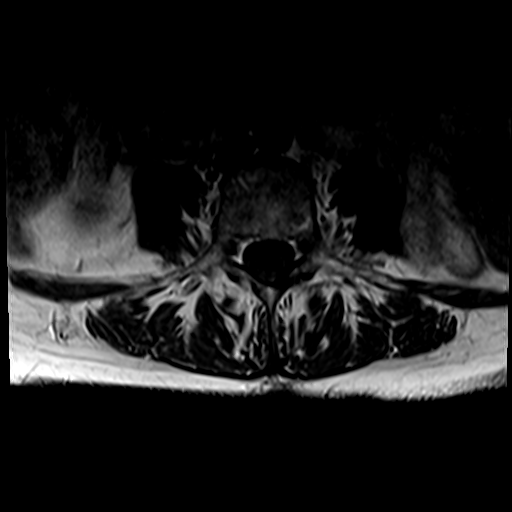
[im 17/36]
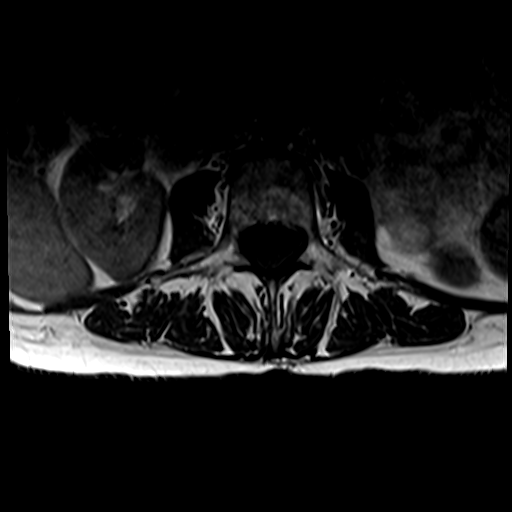
[im 19/36]
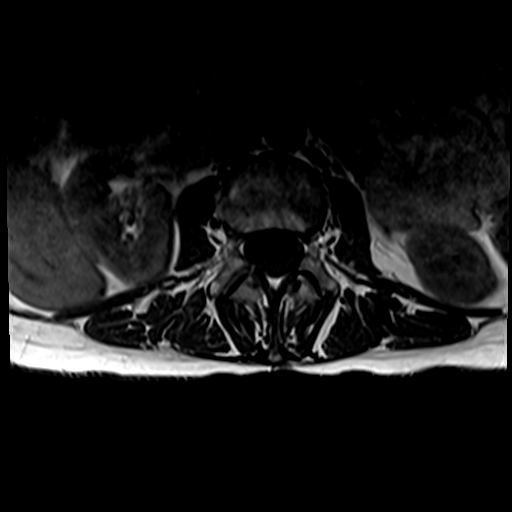
[im 25/36]
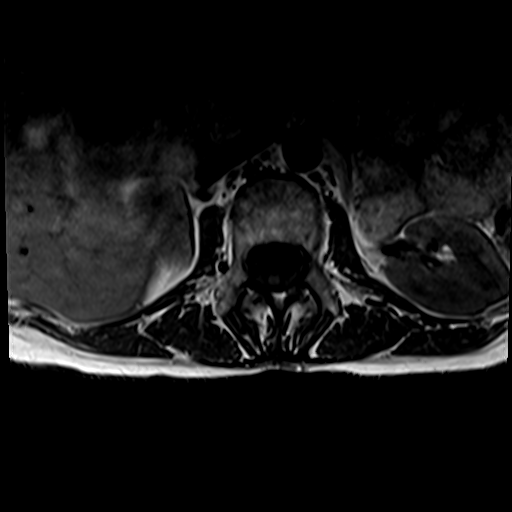
[im 30/36]
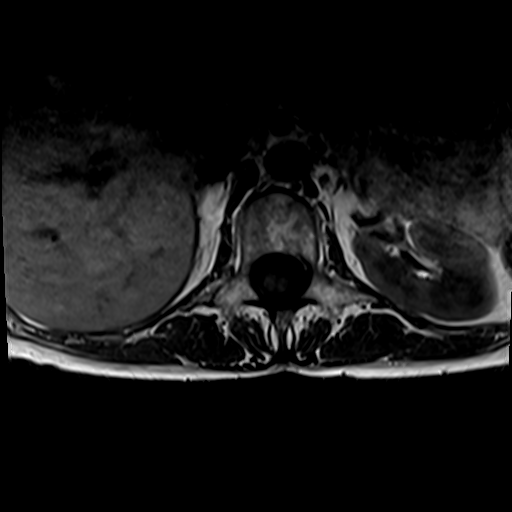
[im 36/36]
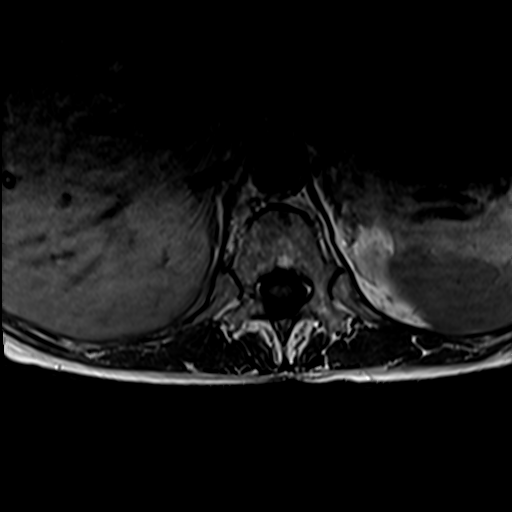

[31 of 48 positions shown; findings below may reference images not displayed]

FINDINGS: Segmentation:  Normal

Alignment:  Normal

Vertebrae:  Normal bone marrow.  Negative for fracture or mass.

Conus medullaris and cauda equina: Conus extends to the L2 level.
Conus and cauda equina appear normal.

Paraspinal and other soft tissues: Negative for paraspinous mass or
adenopathy

Disc levels:

L1-2: Negative

L2-3: Negative

L3-4: Mild facet degeneration on the right. Negative for disc
protrusion or stenosis

L4-5: Mild disc degeneration. Bilateral annular fissure with small
lateral disc protrusions bilaterally. Mild facet degeneration.
Negative for neural impingement

L5-S1: Small right paracentral annular fissure. Negative for disc
protrusion or neural impingement.
IMPRESSION: Mild lumbar degenerative change. Small bilateral disc protrusions
L4-5. Negative for neural impingement

## 2021-03-14 NOTE — Telephone Encounter (Signed)
-----   Message from Cane Savannah, DO sent at 03/14/2021  3:54 PM EDT ----- Let pt know that MRI lumbar spine looks good.  Dr. Posey Pronto can address more when she returns if needed

## 2021-03-14 NOTE — Telephone Encounter (Signed)
Called patient and informed her of results. Patient stated "Well that doesn't explain the numbness and tingling that I am having." Advised patient that Dr. Posey Pronto is out of the office until Wednesday and that I would send a message to see if Dr. Posey Pronto had any further recommendations.

## 2021-03-16 NOTE — Telephone Encounter (Signed)
With her neurological testing being normal, we can screen to see if there is any condition due to low blood flow in the legs with vascular studies (ABI). If agreeable, please order.

## 2021-03-16 NOTE — Telephone Encounter (Signed)
Called patient and informed her of Dr. Serita Grit recommendations. Patient stated that she would like to think about it and if she decides to proceed with the ABI she will contact our office. Patient had no further questions or concerns.

## 2021-03-18 ENCOUNTER — Telehealth: Payer: Self-pay | Admitting: Neurology

## 2021-03-18 NOTE — Telephone Encounter (Signed)
Left message with after hour service at 12:24 pm on 03-18-21   Please call she had a question about her [employer]

## 2021-03-18 NOTE — Telephone Encounter (Signed)
Pt called and informed that Dr Posey Pronto is out of the office today and that we will her back Monday when we hear from Dr Posey Pronto about the B12

## 2021-04-21 LAB — HM DEXA SCAN

## 2021-05-10 DIAGNOSIS — M810 Age-related osteoporosis without current pathological fracture: Secondary | ICD-10-CM

## 2021-05-17 ENCOUNTER — Encounter: Payer: Self-pay | Admitting: Internal Medicine

## 2021-05-20 ENCOUNTER — Telehealth: Payer: Self-pay | Admitting: Internal Medicine

## 2021-05-20 NOTE — Telephone Encounter (Signed)
Patient and her husband were intimate this morning and patient had slightly pink vaginal discharge afterwards , no pain but has had vaginal dryness. Advised patient this can happen as we age with vaginal dryness, but if she develops pain, odor, or worsening symptoms would need to be evaluated. Patient also sent my chart message.

## 2021-05-20 NOTE — Telephone Encounter (Signed)
Noted. Awaiting nurse triage note.

## 2021-05-20 NOTE — Telephone Encounter (Signed)
Patient had sex with her husband this morning and she is now having some pink discharge. No appointments available . Patient was transferred to [facility].

## 2021-06-14 ENCOUNTER — Ambulatory Visit (INDEPENDENT_AMBULATORY_CARE_PROVIDER_SITE_OTHER): Payer: Medicare Other | Admitting: Internal Medicine

## 2021-06-14 ENCOUNTER — Other Ambulatory Visit (HOSPITAL_COMMUNITY)
Admission: RE | Admit: 2021-06-14 | Discharge: 2021-06-14 | Disposition: A | Discharge status: Home / Self Care | Payer: Medicare Other | Source: Ambulatory Visit | Attending: Internal Medicine | Admitting: Internal Medicine

## 2021-06-14 ENCOUNTER — Other Ambulatory Visit: Payer: Self-pay

## 2021-06-14 VITALS — BP 104/68 | HR 76 | Temp 97.5°F | Resp 16 | Ht 62.0 in | Wt 107.6 lb

## 2021-06-14 DIAGNOSIS — N930 Postcoital and contact bleeding: Secondary | ICD-10-CM | POA: Diagnosis not present

## 2021-06-14 DIAGNOSIS — Z1151 Encounter for screening for human papillomavirus (HPV): Secondary | ICD-10-CM | POA: Diagnosis not present

## 2021-06-14 DIAGNOSIS — K5904 Chronic idiopathic constipation: Secondary | ICD-10-CM

## 2021-06-14 DIAGNOSIS — M810 Age-related osteoporosis without current pathological fracture: Secondary | ICD-10-CM | POA: Diagnosis not present

## 2021-06-14 DIAGNOSIS — Z01419 Encounter for gynecological examination (general) (routine) without abnormal findings: Secondary | ICD-10-CM | POA: Insufficient documentation

## 2021-06-14 DIAGNOSIS — N952 Postmenopausal atrophic vaginitis: Secondary | ICD-10-CM | POA: Insufficient documentation

## 2021-06-14 NOTE — Assessment & Plan Note (Signed)
Underweight contributing.  Reviewed protein intake ,  Advised her on her  calcium  Needs. . Repeat 2 years

## 2021-06-14 NOTE — Progress Notes (Signed)
Subjective:  Patient ID: Erica Maldonado, female    DOB: [birth year]  Age: 70 y.o. MRN: [id number]  CC: The primary encounter diagnosis was Postcoital bleeding. Diagnoses of Functional constipation and Age-related osteoporosis without current pathological fracture were also pertinent to this visit.  HPI [name] presents for evaluation of a recent isolated of postcoital bleeding  This visit occurred during the SARS-CoV-2 public health emergency.  Safety protocols were in place, including screening questions prior to the visit, additional usage of staff PPE, and extensive cleaning of exam room while observing appropriate contact time as indicated for disinfecting solutions.   70 yr old female with atrophic vaginitis managed with qod premarin presents for pelvic exam.  Has had intercourse since the reported bleeding ("ping tinged urine") and did not have a repeat.  No pain during sex   Chronic constipation:  with c/p bloating using magnesium .    Outpatient Medications Prior to Visit  Medication Sig Dispense Refill   calcium carbonate (OSCAL) 1500 (600 Ca) MG TABS tablet Take by mouth daily.     CALCIUM-MAGNESIUM-VITAMIN D PO Take 1 capsule by mouth 2 (two) times daily. (Patient not taking: Reported on 06/14/2021)     Cholecalciferol (VITAMIN D3 PO) Take 2,000 Units by mouth daily.     docusate sodium (COLACE) 100 MG capsule Take 100 mg by mouth 2 (two) times daily. (Patient not taking: No sig reported)     estradiol (ESTRACE VAGINAL) 0.1 MG/GM vaginal cream Place 1 Applicatorful vaginally 3 (three) times a week. 42.5 g 12   Magnesium Ascorbate POWD 125 mg daily by Does not apply route.     Nutritional Supplements (NUTRITIONAL SUPPLEMENT PO) Take 1 Dose by mouth daily as needed.     polyethylene glycol (MIRALAX / GLYCOLAX) 17 g packet Take 17 g by mouth. 1 cap 3 days a week (Patient not taking: Reported on 12/02/2020)     No facility-administered medications prior to visit.    Review of  Systems;  Patient denies headache, fevers, malaise, unintentional weight loss, skin rash, eye pain, sinus congestion and sinus pain, sore throat, dysphagia,  hemoptysis , cough, dyspnea, wheezing, chest pain, palpitations, orthopnea, edema, abdominal pain, nausea, melena, diarrhea, constipation, flank pain, dysuria, hematuria, urinary  Frequency, nocturia, numbness, tingling, seizures,  Focal weakness, Loss of consciousness,  Tremor, insomnia, depression, anxiety, and suicidal ideation.      Objective:  BP 104/68   Pulse 76   Temp (!) 97.5 F (36.4 C)   Resp 16   Ht '5\' 2"'$  (1.575 m)   Wt 107 lb 9.6 oz (48.8 kg)   SpO2 99%   BMI 19.68 kg/m   BP Readings from Last 3 Encounters:  06/14/21 104/68  12/02/20 114/80  10/20/20 122/88    Wt Readings from Last 3 Encounters:  06/14/21 107 lb 9.6 oz (48.8 kg)  12/02/20 110 lb 3.2 oz (50 kg)  10/20/20 108 lb 6.4 oz (49.2 kg)    General Appearance:    Alert, cooperative, no distress, appears stated age  Head:    Normocephalic, without obvious abnormality, atraumatic  Eyes:    PERRL, conjunctiva/corneas clear, EOM's intact, fundi    benign, both eyes  Ears:    Normal TM's and external ear canals, both ears  Nose:   Nares normal, septum midline, mucosa normal, no drainage    or sinus tenderness  Throat:   Lips, mucosa, and tongue normal; teeth and gums normal  Neck:   Supple, symmetrical, trachea midline, no adenopathy;  thyroid:  no enlargement/tenderness/nodules; no carotid   bruit or JVD  Back:     Symmetric, no curvature, ROM normal, no CVA tenderness  Lungs:     Clear to auscultation bilaterally, respirations unlabored  Chest Wall:    No tenderness or deformity   Heart:    Regular rate and rhythm, S1 and S2 normal, no murmur, rub   or gallop     Abdomen:     Soft, non-tender, bowel sounds active all four quadrants,    no masses, no organomegaly  Genitalia:    Pelvic: cervix normal in appearance, external genitalia normal, no  adnexal masses or tenderness, no cervical motion tenderness, rectovaginal septum normal, uterus normal size, shape, and consistency and vagina normal without discharge  Extremities:   Extremities normal, atraumatic, no cyanosis or edema  Pulses:   2+ and symmetric all extremities  Skin:   Skin color, texture, turgor normal, no rashes or lesions  Lymph nodes:   Cervical, supraclavicular, and axillary nodes normal  Neurologic:   CNII-XII intact, normal strength, sensation and reflexes    throughout    Lab Results  Component Value Date   HGBA1C 5.0 04/12/2020   HGBA1C 5.2 09/27/2018    Lab Results  Component Value Date   CREATININE 0.84 11/04/2020   CREATININE 0.99 10/20/2020   CREATININE 0.91 04/12/2020    Lab Results  Component Value Date   WBC 6.6 04/12/2020   HGB 13.4 04/12/2020   HCT 41.2 04/12/2020   PLT 244 04/12/2020   GLUCOSE 132 (H) 11/04/2020   CHOL 217 (H) 10/13/2019   TRIG 132 10/13/2019   HDL 78 10/13/2019   LDLCALC 114 (H) 10/13/2019   ALT 14 11/04/2020   AST 17 11/04/2020   NA 140 11/04/2020   K 3.5 11/04/2020   CL 102 11/04/2020   CREATININE 0.84 11/04/2020   BUN 14 11/04/2020   CO2 32 11/04/2020   TSH 2.31 04/12/2020   HGBA1C 5.0 04/12/2020    MR LUMBAR SPINE WO CONTRAST  Result Date: 03/14/2021 CLINICAL DATA:  Low back pain with left flank pain. Bilateral foot numbness. Hyper reflexia EXAM: MRI LUMBAR SPINE WITHOUT CONTRAST TECHNIQUE: Multiplanar, multisequence MR imaging of the lumbar spine was performed. No intravenous contrast was administered. COMPARISON:  None. FINDINGS: Segmentation:  Normal Alignment:  Normal Vertebrae:  Normal bone marrow.  Negative for fracture or mass. Conus medullaris and cauda equina: Conus extends to the L2 level. Conus and cauda equina appear normal. Paraspinal and other soft tissues: Negative for paraspinous mass or adenopathy Disc levels: L1-2: Negative L2-3: Negative L3-4: Mild facet degeneration on the right. Negative  for disc protrusion or stenosis L4-5: Mild disc degeneration. Bilateral annular fissure with small lateral disc protrusions bilaterally. Mild facet degeneration. Negative for neural impingement L5-S1: Small right paracentral annular fissure. Negative for disc protrusion or neural impingement. IMPRESSION: Mild lumbar degenerative change. Small bilateral disc protrusions L4-5. Negative for neural impingement Electronically Signed   By: Franchot Gallo M.D.   On: 03/14/2021 15:36    Assessment & Plan:   Problem List Items Addressed This Visit       Unprioritized   Functional constipation    Adding magnesium.  Cautioned to start with 200 mg daily to avoid diarhea        Osteoporosis    Underweight contributing.  Reviewed protein intake ,  Advised her on her  calcium  Needs. . Repeat 2 years        Relevant Medications  calcium carbonate (OSCAL) 1500 (600 Ca) MG TABS tablet   Postcoital bleeding - Primary    PAP and pelvic done.  Most likely cause is atrophic vaginitis        Relevant Orders   Cytology - PAP    I am having Erica Maldonado maintain her Magnesium Ascorbate, CALCIUM-MAGNESIUM-VITAMIN D PO, Nutritional Supplements (NUTRITIONAL SUPPLEMENT PO), polyethylene glycol, Cholecalciferol (VITAMIN D3 PO), docusate sodium, estradiol, and calcium carbonate.  No orders of the defined types were placed in this encounter.   There are no discontinued medications.  Follow-up: No follow-ups on file.   Crecencio Mc, MD

## 2021-06-14 NOTE — Assessment & Plan Note (Signed)
Adding magnesium.  Cautioned to start with 200 mg daily to avoid diarhea

## 2021-06-14 NOTE — Assessment & Plan Note (Signed)
PAP and pelvic done.  Most likely cause is atrophic vaginitis

## 2021-06-14 NOTE — Progress Notes (Signed)
Subjective:  Patient ID: Erica Maldonado, female    DOB: 04-26-1951  Age: 70 y.o. MRN: [id number]  CC: The primary encounter diagnosis was Postcoital bleeding. Diagnoses of Functional constipation and Age-related osteoporosis without current pathological fracture were also pertinent to this visit.  HPI Erica Maldonado presents for evaluation of a recent isolated of postcoital bleeding  This visit occurred during the SARS-CoV-2 public health emergency.  Safety protocols were in place, including screening questions prior to the visit, additional usage of staff PPE, and extensive cleaning of exam room while observing appropriate contact time as indicated for disinfecting solutions.   70 yr old female with atrophic vaginitis managed with qod premarin presents for pelvic exam.  Has had intercourse since the reported bleeding ("ping tinged urine") and did not have a repeat.  No pain during sex   Chronic constipation:  with c/p bloating using magnesium .    Outpatient Medications Prior to Visit  Medication Sig Dispense Refill   calcium carbonate (OSCAL) 1500 (600 Ca) MG TABS tablet Take by mouth daily.     CALCIUM-MAGNESIUM-VITAMIN D PO Take 1 capsule by mouth 2 (two) times daily. (Patient not taking: Reported on 06/14/2021)     Cholecalciferol (VITAMIN D3 PO) Take 2,000 Units by mouth daily.     docusate sodium (COLACE) 100 MG capsule Take 100 mg by mouth 2 (two) times daily. (Patient not taking: No sig reported)     estradiol (ESTRACE VAGINAL) 0.1 MG/GM vaginal cream Place 1 Applicatorful vaginally 3 (three) times a week. 42.5 g 12   Magnesium Ascorbate POWD 125 mg daily by Does not apply route.     Nutritional Supplements (NUTRITIONAL SUPPLEMENT PO) Take 1 Dose by mouth daily as needed.     polyethylene glycol (MIRALAX / GLYCOLAX) 17 g packet Take 17 g by mouth. 1 cap 3 days a week (Patient not taking: Reported on 12/02/2020)     No facility-administered medications prior to visit.    Review of  Systems;  Patient denies headache, fevers, malaise, unintentional weight loss, skin rash, eye pain, sinus congestion and sinus pain, sore throat, dysphagia,  hemoptysis , cough, dyspnea, wheezing, chest pain, palpitations, orthopnea, edema, abdominal pain, nausea, melena, diarrhea, constipation, flank pain, dysuria, hematuria, urinary  Frequency, nocturia, numbness, tingling, seizures,  Focal weakness, Loss of consciousness,  Tremor, insomnia, depression, anxiety, and suicidal ideation.      Objective:  BP 104/68   Pulse 76   Temp (!) 97.5 F (36.4 C)   Resp 16   Ht '5\' 2"'$  (1.575 m)   Wt 107 lb 9.6 oz (48.8 kg)   SpO2 99%   BMI 19.68 kg/m   BP Readings from Last 3 Encounters:  06/14/21 104/68  12/02/20 114/80  10/20/20 122/88    Wt Readings from Last 3 Encounters:  06/14/21 107 lb 9.6 oz (48.8 kg)  12/02/20 110 lb 3.2 oz (50 kg)  10/20/20 108 lb 6.4 oz (49.2 kg)    General Appearance:    Alert, cooperative, no distress, appears stated age  Head:    Normocephalic, without obvious abnormality, atraumatic  Eyes:    PERRL, conjunctiva/corneas clear, EOM's intact, fundi    benign, both eyes  Ears:    Normal TM's and external ear canals, both ears  Nose:   Nares normal, septum midline, mucosa normal, no drainage    or sinus tenderness  Throat:   Lips, mucosa, and tongue normal; teeth and gums normal  Neck:   Supple, symmetrical, trachea midline, no adenopathy;  thyroid:  no enlargement/tenderness/nodules; no carotid   bruit or JVD  Back:     Symmetric, no curvature, ROM normal, no CVA tenderness  Lungs:     Clear to auscultation bilaterally, respirations unlabored  Chest Wall:    No tenderness or deformity   Heart:    Regular rate and rhythm, S1 and S2 normal, no murmur, rub   or gallop     Abdomen:     Soft, non-tender, bowel sounds active all four quadrants,    no masses, no organomegaly  Genitalia:    Pelvic: cervix normal in appearance, external genitalia normal, no  adnexal masses or tenderness, no cervical motion tenderness, rectovaginal septum normal, uterus normal size, shape, and consistency and vagina normal without discharge  Extremities:   Extremities normal, atraumatic, no cyanosis or edema  Pulses:   2+ and symmetric all extremities  Skin:   Skin color, texture, turgor normal, no rashes or lesions  Lymph nodes:   Cervical, supraclavicular, and axillary nodes normal  Neurologic:   CNII-XII intact, normal strength, sensation and reflexes    throughout    Lab Results  Component Value Date   HGBA1C 5.0 04/12/2020   HGBA1C 5.2 09/27/2018    Lab Results  Component Value Date   CREATININE 0.84 11/04/2020   CREATININE 0.99 10/20/2020   CREATININE 0.91 04/12/2020    Lab Results  Component Value Date   WBC 6.6 04/12/2020   HGB 13.4 04/12/2020   HCT 41.2 04/12/2020   PLT 244 04/12/2020   GLUCOSE 132 (H) 11/04/2020   CHOL 217 (H) 10/13/2019   TRIG 132 10/13/2019   HDL 78 10/13/2019   LDLCALC 114 (H) 10/13/2019   ALT 14 11/04/2020   AST 17 11/04/2020   NA 140 11/04/2020   K 3.5 11/04/2020   CL 102 11/04/2020   CREATININE 0.84 11/04/2020   BUN 14 11/04/2020   CO2 32 11/04/2020   TSH 2.31 04/12/2020   HGBA1C 5.0 04/12/2020    MR LUMBAR SPINE WO CONTRAST  Result Date: 03/14/2021 CLINICAL DATA:  Low back pain with left flank pain. Bilateral foot numbness. Hyper reflexia EXAM: MRI LUMBAR SPINE WITHOUT CONTRAST TECHNIQUE: Multiplanar, multisequence MR imaging of the lumbar spine was performed. No intravenous contrast was administered. COMPARISON:  None. FINDINGS: Segmentation:  Normal Alignment:  Normal Vertebrae:  Normal bone marrow.  Negative for fracture or mass. Conus medullaris and cauda equina: Conus extends to the L2 level. Conus and cauda equina appear normal. Paraspinal and other soft tissues: Negative for paraspinous mass or adenopathy Disc levels: L1-2: Negative L2-3: Negative L3-4: Mild facet degeneration on the right. Negative  for disc protrusion or stenosis L4-5: Mild disc degeneration. Bilateral annular fissure with small lateral disc protrusions bilaterally. Mild facet degeneration. Negative for neural impingement L5-S1: Small right paracentral annular fissure. Negative for disc protrusion or neural impingement. IMPRESSION: Mild lumbar degenerative change. Small bilateral disc protrusions L4-5. Negative for neural impingement Electronically Signed   By: Franchot Gallo M.D.   On: 03/14/2021 15:36    Assessment & Plan:   Problem List Items Addressed This Visit       Unprioritized   Functional constipation    Adding magnesium.  Cautioned to start with 200 mg daily to avoid diarhea       Osteoporosis    Underweight contributing.  Reviewed protein intake ,  Advised her on her  calcium  Needs. . Repeat 2 years       Relevant Medications   calcium carbonate (  OSCAL) 1500 (600 Ca) MG TABS tablet   Postcoital bleeding - Primary    PAP and pelvic done.  Most likely cause is atrophic vaginitis       Relevant Orders   Cytology - PAP    I am having Erica Maldonado maintain her Magnesium Ascorbate, CALCIUM-MAGNESIUM-VITAMIN D PO, Nutritional Supplements (NUTRITIONAL SUPPLEMENT PO), polyethylene glycol, Cholecalciferol (VITAMIN D3 PO), docusate sodium, estradiol, and calcium carbonate.  No orders of the defined types were placed in this encounter.   There are no discontinued medications.  Follow-up: No follow-ups on file.   Crecencio Mc, MD

## 2021-06-14 NOTE — Patient Instructions (Addendum)
PAP smear has been done today  Your T scores are worsening,  so make sure you are getting enough calcium and Vitamin d.  We will repeat the dexa in 2 years.   1800 mg calcium  and 2000 Ius D3 daily  (get as much calcium from dietary or non dairy sources    Do not lose any more weight!   Start with 200 mg calcium daily.  (400 mg can cause VERY LOOSE stools)

## 2021-06-17 LAB — CYTOLOGY - PAP
Comment: NEGATIVE
Diagnosis: NEGATIVE
High risk HPV: NEGATIVE

## 2021-07-07 ENCOUNTER — Other Ambulatory Visit: Payer: Self-pay | Admitting: Internal Medicine

## 2021-07-07 DIAGNOSIS — Z1231 Encounter for screening mammogram for malignant neoplasm of breast: Secondary | ICD-10-CM

## 2021-08-02 ENCOUNTER — Telehealth: Payer: Self-pay | Admitting: Internal Medicine

## 2021-08-02 NOTE — Telephone Encounter (Signed)
Called to speak with Vaughan Basta. Pippin states that she has been having "Small [place]ball" sized stool and she has not had a complete bowel movement. Pt states that she last used the restroom the morning of 08/02/21 and that it was firm and difficult to pass. Pt states that she used to use Miralax but has weaned herself off the medication and does not know when she last took it. Pt states that Colace does not help much and that she currently is only taking her vitamins and a off brand of miralax called Clearlax. Pt asks if there is any other options she can use. Pt confirmed her appointment with Mable Paris on 08/23/21.

## 2021-08-02 NOTE — Telephone Encounter (Signed)
I would  continue the clearlax,  which is generic for miralax, add colace as a stool softene r200 mg at bedtime  and glycerin suppositories

## 2021-08-02 NOTE — Telephone Encounter (Signed)
The patient stated she is experiencing extreme constipation. She wants to talk with Dr. Derrel Nip about options for her constipation . She is in [place] and has an appointment scheduled with [facility] on 10.3.22.

## 2021-08-03 NOTE — Telephone Encounter (Signed)
How often does pt need to use the glycerin suppositories?

## 2021-08-03 NOTE — Telephone Encounter (Signed)
Patient has been notified

## 2021-08-22 ENCOUNTER — Encounter: Payer: Self-pay | Admitting: Family

## 2021-08-22 ENCOUNTER — Ambulatory Visit (INDEPENDENT_AMBULATORY_CARE_PROVIDER_SITE_OTHER): Payer: Medicare Other | Admitting: Family

## 2021-08-22 ENCOUNTER — Other Ambulatory Visit: Payer: Self-pay

## 2021-08-22 VITALS — BP 150/98 | HR 79 | Temp 96.9°F | Ht 62.01 in | Wt 108.4 lb

## 2021-08-22 DIAGNOSIS — K5904 Chronic idiopathic constipation: Secondary | ICD-10-CM | POA: Diagnosis not present

## 2021-08-22 LAB — HEMOCCULT GUIAC POC 1CARD (OFFICE): Fecal Occult Blood, POC: NEGATIVE

## 2021-08-22 NOTE — Assessment & Plan Note (Addendum)
Acute on chronic. Longstanding h/o constipation. Failed amitiza, linzess in the past.  Some improvement when on glycerin suppositories night, magnesium 200mg  bid. However concern for small loose brown stool, ? Inadequate emptying.  Negative occult test in office today.  Rectal exam does not reveal mass, fecal impaction.advised and reassured patient she may continue miralax as well as how to titrate to how many days/week. Advised to continue magnesium 200mg  twice daily and she may increase to 400mg  twice daily. Continue  glycerin suppositories nightly.  Encouraged continued adequate hydration, high-fiber diet.  Referral placed to general surgery, Dr. Bary Castilla due to change in stool size, constipation for colonoscopy ( screening due 11/2021). She will let me know how she is doing

## 2021-08-22 NOTE — Progress Notes (Signed)
Subjective:    Patient ID: Erica Maldonado, female    DOB: [birth year], 70 y.o.   MRN: [id number]  CC: Debrah Granderson is a 70 y.o. female who presents today for an acute visit.    HPI: HPI Acute visit for constipation x 2 months, worsened. Note prior h/o constipation. Better today.  Stools had been hard to pass and in small hard brown balls.  Stool is soft however not as normally used to be in volume. Stool diameter is thinner in diameter.  Last BM last night. She maintains on magnesium 200mg  BID, glycerin suppositories qpm. Drinking plenty of water and has been working on fiber intake, has increased.She is eating lots of fruits and vegetables.  She has tried linzess, Actuary.  She suspects h/o IBS.   She had been on MiraLAX, Colace.  Weaned herself off of MiraLAX , she had been on 5 days per week, and then 3 days per week.  She is no longer on miralax, Colace  Colace was not very helpful.She wasn't sure how long to be on medications.  Occasional bloating.   No blood in stools, , fever,  PCP Dr Derrel Nip advised on 08/02/2021 to continue ClearLax (MiraLAX) and stool softener 200 mg at bedtime and also glycerin suppositories qd  She is also been on magnesium 200 mg in the past  Nonsmoker Colonoscopy with Dr. Bary Castilla 2013.  Advised to repeat 10 years.  HISTORY:  Past Medical History:  Diagnosis Date   Abdominal or pelvic swelling, mass, or lump, left lower quadrant    Epistaxis    s/p cauterization   Fibroadenoma    right breast   Hemorrhoids, external, without mention of complication    Infectious diarrhea(009.2)    history of   Other bursitis disorders    Other chest pain    Sleep disturbance, unspecified    Umbilical hernia without mention of obstruction or gangrene    Past Surgical History:  Procedure Laterality Date   BREAST BIOPSY Right 1995   Dr Bary Castilla, neg   BREAST EXCISIONAL BIOPSY Right 1988   fibroadenoma removed,neg   COLONOSCOPY  2001, 2011   Dr Vira Agar   DILATION  AND CURETTAGE OF UTERUS  2001   Dr Laurey Morale   HYSTEROSCOPY  1995   normal/ Dr Laurey Morale   Family History  Problem Relation Age of Onset   Breast cancer Mother 59   Cancer Mother        breast, developed uterine cancer while on tamoxifen   Cancer Brother        melanoma/scalp    Allergies: Amitiza [lubiprostone], Naprosyn [naproxen], Biaxin [clarithromycin], Ibuprofen, and Clarithromycin Current Outpatient Medications on File Prior to Visit  Medication Sig Dispense Refill   Cholecalciferol (VITAMIN D3 PO) Take 2,000 Units by mouth daily.     docusate sodium (COLACE) 100 MG capsule Take 100 mg by mouth 2 (two) times daily.     estradiol (ESTRACE VAGINAL) 0.1 MG/GM vaginal cream Place 1 Applicatorful vaginally 3 (three) times a week. 42.5 g 12   Magnesium Ascorbate POWD 125 mg daily by Does not apply route.     Nutritional Supplements (NUTRITIONAL SUPPLEMENT PO) Take 1 Dose by mouth daily as needed.     polyethylene glycol (MIRALAX / GLYCOLAX) 17 g packet Take 17 g by mouth. 1 cap 3 days a week     calcium carbonate (OSCAL) 1500 (600 Ca) MG TABS tablet Take by mouth daily.     CALCIUM-MAGNESIUM-VITAMIN D PO Take 1 capsule by  mouth 2 (two) times daily. (Patient not taking: Reported on 06/14/2021)     No current facility-administered medications on file prior to visit.    Social History   Tobacco Use   Smoking status: Never   Smokeless tobacco: Never   Tobacco comments:    Remote history  Substance Use Topics   Alcohol use: Yes    Comment: daily red wine   Drug use: No    Review of Systems  Constitutional:  Negative for chills, fever and unexpected weight change.  Respiratory:  Negative for cough.   Cardiovascular:  Negative for chest pain and palpitations.  Gastrointestinal:  Positive for constipation. Negative for abdominal distention, abdominal pain, blood in stool, diarrhea, nausea and vomiting.     Objective:    BP (!) 150/98 (BP Location: Left Arm, Patient Position:  Sitting)   Pulse 79   Temp (!) 96.9 F (36.1 C)   Ht 5' 2.01" (1.575 m)   Wt 108 lb 6.4 oz (49.2 kg)   SpO2 98%   BMI 19.82 kg/m    Physical Exam Vitals reviewed.  Constitutional:      Appearance: Normal appearance. She is well-developed.  Eyes:     Conjunctiva/sclera: Conjunctivae normal.  Cardiovascular:     Rate and Rhythm: Normal rate and regular rhythm.     Pulses: Normal pulses.     Heart sounds: Normal heart sounds.  Pulmonary:     Effort: Pulmonary effort is normal.     Breath sounds: Normal breath sounds. No wheezing, rhonchi or rales.  Abdominal:     General: Bowel sounds are normal. There is no distension.     Palpations: Abdomen is soft. Abdomen is not rigid. There is no fluid wave or mass.     Tenderness: There is no abdominal tenderness. There is no guarding or rebound.  Genitourinary:    Rectum: Guaiac result negative. No mass, tenderness, external hemorrhoid or internal hemorrhoid.     Comments: No rectal mass or fecal impaction noted with digital exam.  Skin:    General: Skin is warm and dry.  Neurological:     Mental Status: She is alert.  Psychiatric:        Speech: Speech normal.        Behavior: Behavior normal.        Thought Content: Thought content normal.       Assessment & Plan:      I am having Erica Maldonado maintain her Magnesium Ascorbate, CALCIUM-MAGNESIUM-VITAMIN D PO, Nutritional Supplements (NUTRITIONAL SUPPLEMENT PO), polyethylene glycol, Cholecalciferol (VITAMIN D3 PO), docusate sodium, estradiol, and calcium carbonate.   No orders of the defined types were placed in this encounter.   Return precautions given.   Risks, benefits, and alternatives of the medications and treatment plan prescribed today were discussed, and patient expressed understanding.   Education regarding symptom management and diagnosis given to patient on AVS.  Continue to follow with Crecencio Mc, MD for routine health maintenance.   Erica Maldonado  and I agreed with plan.   Mable Paris, FNP  I have spent 25 minutes with a patient including precharting, exam, reviewing medical records, education regarding constipation, and discussion plan of care.

## 2021-08-22 NOTE — Patient Instructions (Addendum)
Referral to Dr Terri Piedra  Let us know if you dont hear back within a week in regards to an appointment being scheduled.   Continue   1) miralax ( will need to titrate to how many days/week)  2) magnesium 200mg  twice daily ( may increase to 400mg  twice daily) 3) glycerin suppositories nightly  Let me know how you are doing

## 2021-08-23 ENCOUNTER — Encounter: Payer: Self-pay | Admitting: Family

## 2021-08-23 LAB — CBC WITH DIFFERENTIAL/PLATELET
Absolute Monocytes: 514 cells/uL (ref 200–950)
Basophils Absolute: 32 cells/uL (ref 0–200)
Basophils Relative: 0.6 %
Eosinophils Absolute: 21 cells/uL (ref 15–500)
Eosinophils Relative: 0.4 %
HCT: 43.6 % (ref 35.0–45.0)
Hemoglobin: 14.4 g/dL (ref 11.7–15.5)
Lymphs Abs: 1394 cells/uL (ref 850–3900)
MCH: 29.4 pg (ref 27.0–33.0)
MCHC: 33 g/dL (ref 32.0–36.0)
MCV: 89.2 fL (ref 80.0–100.0)
MPV: 11.4 fL (ref 7.5–12.5)
Monocytes Relative: 9.7 %
Neutro Abs: 3339 cells/uL (ref 1500–7800)
Neutrophils Relative %: 63 %
Platelets: 242 10*3/uL (ref 140–400)
RBC: 4.89 10*6/uL (ref 3.80–5.10)
RDW: 13 % (ref 11.0–15.0)
Total Lymphocyte: 26.3 %
WBC: 5.3 10*3/uL (ref 3.8–10.8)

## 2021-08-23 LAB — TSH: TSH: 2.86 mIU/L (ref 0.40–4.50)

## 2021-08-23 LAB — CELIAC DISEASE PANEL
(tTG) Ab, IgA: <1 U/mL
(tTG) Ab, IgG: <1 U/mL
Gliadin IgA: 4.8 U/mL
Gliadin IgG: <1 U/mL
Immunoglobulin A: 173 mg/dL (ref 70–320)

## 2021-09-13 ENCOUNTER — Other Ambulatory Visit: Payer: Self-pay | Admitting: General Surgery

## 2021-09-13 NOTE — Progress Notes (Signed)
Subjective:     Patient ID: Erica Maldonado is a 70 y.o. female.   HPI   The following portions of the patient's history were reviewed and updated as appropriate.   This an established patient is here today for: office visit. The patient is here today to discuss changes in bowels and constipation.  The patient had been making use of daily MiraLAX until summer when she decided to wean off of it.  As time went on her stool frequency became lower and the stool volume became larger.  On at least 1 occasion she required manual disimpaction.  Since reinitiating MiraLAX therapy 4-6 weeks ago there is not been a return to her normal soft daily elimination.  She denies any history of blood or mucus in the stool.  No abdominal cramping.  No weight loss.        Chief Complaint  Patient presents with   Pre-op Exam      BP 130/62   Pulse 82   Temp 36.2 C (97.1 F)   Ht 157.5 cm (5\' 2" )   Wt 49 kg (108 lb)   SpO2 98%   BMI 19.75 kg/m        Past Medical History:  Diagnosis Date   Bursitis     Fibroadenoma     Hemorrhoid     Sleep disturbance             Past Surgical History:  Procedure Laterality Date   BREAST EXCISIONAL BIOPSY Right 1995   BREAST EXCISIONAL BIOPSY   1988   COLONOSCOPY   2001   COLONOSCOPY   2011   DILATION AND CURETTAGE, DIAGNOSTIC / THERAPEUTIC   2001   HYSTEROSCOPY   1995        OB History   No obstetric history on file.     Obstetric Comments  Age at first period Age of first pregnancy             Social History           Socioeconomic History   Marital status: Married  Tobacco Use   Smoking status: Never Smoker  Substance and Sexual Activity   Alcohol use: Yes      Comment: occa   Drug use: Never             Allergies  Allergen Reactions   Clarithromycin Rash and Other (See Comments)      Biaxin   Ibuprofen Other (See Comments)      Current Medications        Current Outpatient Medications  Medication Sig Dispense Refill    calcium carb/vitamin D3/vit K1 (CALCIUM SOFT CHEW ORAL) Take by mouth once daily       estradioL (ESTRACE) 0.01 % (0.1 mg/gram) vaginal cream Place vaginally       glycerin adult suppository Place 1 suppository rectally once daily       magnesium citrate 100 mg Cap Take by mouth 2 (two) times daily Patient takes two pills twice daily.       omega 3-dha-epa-fish oil (FISH OIL) 1,000 mg (120 mg-180 mg) Cap Take by mouth 2 (two) times daily       polyethylene glycol (MIRALAX) powder Take by mouth        No current facility-administered medications for this visit.             Family History  Problem Relation Age of Onset   Breast cancer Mother 41   Cancer Brother  Labs and Radiology:      December 05, 2011 colonoscopy: Normal.   Follow-up exam in 2023 recommended.   Laboratory review August 22, 2021:   WBC 3.8 - 10.8 Thousand/uL 5.3   RBC 3.80 - 5.10 Million/uL 4.89   Hemoglobin 11.7 - 15.5 g/dL 14.4   HCT 35.0 - 45.0 % 43.6   MCV 80.0 - 100.0 fL 89.2   MCH 27.0 - 33.0 pg 29.4   MCHC 32.0 - 36.0 g/dL 33.0   RDW 11.0 - 15.0 % 13.0   Platelets 140 - 400 Thousand/uL 242   MPV 7.5 - 12.5 fL 11.4   Neutro Abs 1,500 - 7,800 cells/uL 3,339   Lymphs Abs 850 - 3,900 cells/uL 1,394   Absolute Monocytes 200 - 950 cells/uL 514   Eosinophils Absolute 15 - 500 cells/uL 21   Basophils Absolute 0 - 200 cells/uL 32   Neutrophils Relative % % 63   Total Lymphocyte % 26.3   Monocytes Relative % 9.7   Eosinophils Relative % 0.4   Basophils Relative % 0.6     Component Ref Range & Units 2 wk ago   Fecal Occult Blood, POC Negative Negative   Card #1 Date          Component Ref Range & Units 10 mo ago  (11/04/20) 10 mo ago  (10/20/20) 1 yr ago  (04/12/20) 1 yr ago  (10/13/19) 2 yr ago  (01/16/19) 2 yr ago  (09/27/18) 3 yr ago  (09/28/17)  Sodium 135 - 145 mEq/L 140  140  142 R  146 R    143 R  140 R   Potassium 3.5 - 5.1 mEq/L 3.5  3.6  4.0 R  4.8 R    4.1 R  3.6 R   Chloride  96 - 112 mEq/L 102  102  103 R  104 R    104 R  100 R   CO2 19 - 32 mEq/L 32  30  29 R  27 R    25 R  30 R   Glucose, Bld 70 - 99 mg/dL 132 High   85  86 R, CM  88 R, CM    83 R, CM  88 R, CM   BUN 6 - 23 mg/dL 14  18  20  R  15 R    18 R  13 R   Creatinine, Ser 0.40 - 1.20 mg/dL 0.84  0.99  0.91 R, CM  0.81 R, CM  0.80 R  0.65 R, CM  0.90 R, CM   Total Bilirubin 0.2 - 1.2 mg/dL 0.6  0.7  0.5  0.5    0.6  0.5   Alkaline Phosphatase 39 - 117 U/L 61  64             AST 0 - 37 U/L 17  21  28  R  24 R    21 R  20 R   ALT 0 - 35 U/L 14  16  27  R  17 R    13 R  13 R   Total Protein 6.0 - 8.3 g/dL 6.6  6.9  6.8 R  7.1 R    6.6 R  7.0 R   Albumin 3.5 - 5.2 g/dL 4.4  4.5             GFR >60.00 mL/min 70.62  58.00 Low  CM  Review of Systems  Constitutional: Negative for chills and fever.  Respiratory: Negative for cough.          Objective:   Physical Exam Exam conducted with a chaperone present.  Constitutional:      Appearance: Normal appearance.  Cardiovascular:     Rate and Rhythm: Normal rate and regular rhythm.     Pulses: Normal pulses.     Heart sounds: Normal heart sounds.  Pulmonary:     Effort: Pulmonary effort is normal.     Breath sounds: Normal breath sounds.  Abdominal:     Palpations: Abdomen is soft.     Tenderness: There is no abdominal tenderness.    Musculoskeletal:     Cervical back: Neck supple.  Skin:    General: Skin is warm and dry.  Neurological:     Mental Status: She is alert and oriented to person, place, and time.  Psychiatric:        Mood and Affect: Mood normal.        Behavior: Behavior normal.           Assessment:     Change in bowel habits likely related to cessation of MiraLAX, subsequent "backlog".    Plan:     Colonoscopy would be appropriate as she is due in just 3 months.  Considering the anticipated stool volume fairly aggressive cleanout will be appropriate.  We will make use of Dulcolax suppositories as  well as a 2-day prep with 2 L of PEG solution each day to have the best chance for a clear exam.    This note is partially prepared by Ledell Noss, CMA acting as a scribe in the presence of Dr. Hervey Ard, MD.    The documentation recorded by the scribe accurately reflects the service I personally performed and the decisions made by me.    Robert Bellow, MD FACS

## 2021-10-18 ENCOUNTER — Ambulatory Visit (INDEPENDENT_AMBULATORY_CARE_PROVIDER_SITE_OTHER): Payer: Medicare Other

## 2021-10-18 VITALS — Ht 62.01 in | Wt 108.0 lb

## 2021-10-18 DIAGNOSIS — Z0000 Encounter for general adult medical examination without abnormal findings: Secondary | ICD-10-CM | POA: Diagnosis not present

## 2021-10-18 NOTE — Patient Instructions (Addendum)
[name] , Thank you for taking time to come for your Medicare Wellness Visit. I appreciate your ongoing commitment to your health goals. Please review the following plan we discussed and let me know if I can assist you in the future.   These are the goals we discussed:  Goals      Maintain healthy lifestyle     Stay active Healthy diet        This is a list of the screening recommended for you and due dates:  Health Maintenance  Topic Date Due   Flu Shot  10/20/2021*   Zoster (Shingles) Vaccine (1 of 2) 01/17/2022*   Pneumonia Vaccine (1 - PCV) 10/18/2022*   Colon Cancer Screening  12/04/2021   Mammogram  10/20/2022   Tetanus Vaccine  10/05/2027   DEXA scan (bone density measurement)  Completed   Hepatitis C Screening: USPSTF Recommendation to screen - Ages 85-79 yo.  Completed   HPV Vaccine  Aged Out   COVID-19 Vaccine  Discontinued  *Topic was postponed. The date shown is not the original due date.   Advanced directives: on file  Conditions/risks identified: none new  Follow up in one year for your annual wellness visit    Preventive Care 65 Years and Older, Female Preventive care refers to lifestyle choices and visits with your health care provider that can promote health and wellness. What does preventive care include? A yearly physical exam. This is also called an annual well check. Dental exams once or twice a year. Routine eye exams. Ask your health care provider how often you should have your eyes checked. Personal lifestyle choices, including: Daily care of your teeth and gums. Regular physical activity. Eating a healthy diet. Avoiding tobacco and drug use. Limiting alcohol use. Practicing safe sex. Taking low-dose aspirin every day. Taking vitamin and mineral supplements as recommended by your health care provider. What happens during an annual well check? The services and screenings done by your health care provider during your annual well check will  depend on your age, overall health, lifestyle risk factors, and family history of disease. Counseling  Your health care provider may ask you questions about your: Alcohol use. Tobacco use. Drug use. Emotional well-being. Home and relationship well-being. Sexual activity. Eating habits. History of falls. Memory and ability to understand (cognition). Work and work Statistician. Reproductive health. Screening  You may have the following tests or measurements: Height, weight, and BMI. Blood pressure. Lipid and cholesterol levels. These may be checked every 5 years, or more frequently if you are over 90 years old. Skin check. Lung cancer screening. You may have this screening every year starting at age 59 if you have a 30-pack-year history of smoking and currently smoke or have quit within the past 15 years. Fecal occult blood test (FOBT) of the stool. You may have this test every year starting at age 61. Flexible sigmoidoscopy or colonoscopy. You may have a sigmoidoscopy every 5 years or a colonoscopy every 10 years starting at age 11. Hepatitis C blood test. Hepatitis B blood test. Sexually transmitted disease (STD) testing. Diabetes screening. This is done by checking your blood sugar (glucose) after you have not eaten for a while (fasting). You may have this done every 1-3 years. Bone density scan. This is done to screen for osteoporosis. You may have this done starting at age 58. Mammogram. This may be done every 1-2 years. Talk to your health care provider about how often you should have regular mammograms.  Talk with your health care provider about your test results, treatment options, and if necessary, the need for more tests. Vaccines  Your health care provider may recommend certain vaccines, such as: Influenza vaccine. This is recommended every year. Tetanus, diphtheria, and acellular pertussis (Tdap, Td) vaccine. You may need a Td booster every 10 years. Zoster vaccine. You may  need this after age 7. Pneumococcal 13-valent conjugate (PCV13) vaccine. One dose is recommended after age 38. Pneumococcal polysaccharide (PPSV23) vaccine. One dose is recommended after age 20. Talk to your health care provider about which screenings and vaccines you need and how often you need them. This information is not intended to replace advice given to you by your health care provider. Make sure you discuss any questions you have with your health care provider. Document Released: 12/03/2015 Document Revised: 07/26/2016 Document Reviewed: 09/07/2015 Elsevier Interactive Patient Education  2017 [place] Prevention in the Home Falls can cause injuries. They can happen to people of all ages. There are many things you can do to make your home safe and to help prevent falls. What can I do on the outside of my home? Regularly fix the edges of walkways and driveways and fix any cracks. Remove anything that might make you trip as you walk through a door, such as a raised step or threshold. Trim any bushes or trees on the path to your home. Use bright outdoor lighting. Clear any walking paths of anything that might make someone trip, such as rocks or tools. Regularly check to see if handrails are loose or broken. Make sure that both sides of any steps have handrails. Any raised decks and porches should have guardrails on the edges. Have any leaves, snow, or ice cleared regularly. Use sand or salt on walking paths during winter. Clean up any spills in your garage right away. This includes oil or grease spills. What can I do in the bathroom? Use night lights. Install grab bars by the toilet and in the tub and shower. Do not use towel bars as grab bars. Use non-skid mats or decals in the tub or shower. If you need to sit down in the shower, use a plastic, non-slip stool. Keep the floor dry. Clean up any water that spills on the floor as soon as it happens. Remove soap buildup in  the tub or shower regularly. Attach bath mats securely with double-sided non-slip rug tape. Do not have throw rugs and other things on the floor that can make you trip. What can I do in the bedroom? Use night lights. Make sure that you have a light by your bed that is easy to reach. Do not use any sheets or blankets that are too big for your bed. They should not hang down onto the floor. Have a firm chair that has side arms. You can use this for support while you get dressed. Do not have throw rugs and other things on the floor that can make you trip. What can I do in the kitchen? Clean up any spills right away. Avoid walking on wet floors. Keep items that you use a lot in easy-to-reach places. If you need to reach something above you, use a strong step stool that has a grab bar. Keep electrical cords out of the way. Do not use floor polish or wax that makes floors slippery. If you must use wax, use non-skid floor wax. Do not have throw rugs and other things on the floor that can make  you trip. What can I do with my stairs? Do not leave any items on the stairs. Make sure that there are handrails on both sides of the stairs and use them. Fix handrails that are broken or loose. Make sure that handrails are as long as the stairways. Check any carpeting to make sure that it is firmly attached to the stairs. Fix any carpet that is loose or worn. Avoid having throw rugs at the top or bottom of the stairs. If you do have throw rugs, attach them to the floor with carpet tape. Make sure that you have a light switch at the top of the stairs and the bottom of the stairs. If you do not have them, ask someone to add them for you. What else can I do to help prevent falls? Wear shoes that: Do not have high heels. Have rubber bottoms. Are comfortable and fit you well. Are closed at the toe. Do not wear sandals. If you use a stepladder: Make sure that it is fully opened. Do not climb a closed  stepladder. Make sure that both sides of the stepladder are locked into place. Ask someone to hold it for you, if possible. Clearly mark and make sure that you can see: Any grab bars or handrails. First and last steps. Where the edge of each step is. Use tools that help you move around (mobility aids) if they are needed. These include: Canes. Walkers. Scooters. Crutches. Turn on the lights when you go into a dark area. Replace any light bulbs as soon as they burn out. Set up your furniture so you have a clear path. Avoid moving your furniture around. If any of your floors are uneven, fix them. If there are any pets around you, be aware of where they are. Review your medicines with your doctor. Some medicines can make you feel dizzy. This can increase your chance of falling. Ask your doctor what other things that you can do to help prevent falls. This information is not intended to replace advice given to you by your health care provider. Make sure you discuss any questions you have with your health care provider. Document Released: 09/02/2009 Document Revised: 04/13/2016 Document Reviewed: 12/11/2014 Elsevier Interactive Patient Education  2017 [employer].

## 2021-10-18 NOTE — Progress Notes (Signed)
Subjective:   Erica Maldonado is a 70 y.o. female who presents for Medicare Annual (Subsequent) preventive examination.  Review of Systems    No ROS.  Medicare Wellness Virtual Visit.  Visual/audio telehealth visit, UTA vital signs.   See social history for additional risk factors.   Cardiac Risk Factors include: advanced age (>29men, >88 women)     Objective:    Today's Vitals   10/18/21 1235  Weight: 108 lb (49 kg)  Height: 5' 2.01" (1.575 m)   Body mass index is 19.75 kg/m.  Advanced Directives 10/18/2021 10/13/2020 10/13/2019 10/08/2018  Does Patient Have a Medical Advance Directive? Yes Yes Yes Yes  Type of Paramedic of [place];Living will [place];Living will [place];Living will [place];Living will  Does patient want to make changes to medical advance directive? No - Patient declined No - Patient declined No - Patient declined No - Patient declined  Copy of [place] in Chart? Yes - validated most recent copy scanned in chart (See row information) No - copy requested Yes - validated most recent copy scanned in chart (See row information) Yes - validated most recent copy scanned in chart (See row information)    Current Medications (verified) Outpatient Encounter Medications as of 10/18/2021  Medication Sig   Cholecalciferol (VITAMIN D3 PO) Take 2,000 Units by mouth daily.   docusate sodium (COLACE) 100 MG capsule Take 100 mg by mouth 2 (two) times daily.   estradiol (ESTRACE VAGINAL) 0.1 MG/GM vaginal cream Place 1 Applicatorful vaginally 3 (three) times a week.   Magnesium Ascorbate POWD 125 mg daily by Does not apply route.   Nutritional Supplements (NUTRITIONAL SUPPLEMENT PO) Take 1 Dose by mouth daily as needed.   polyethylene glycol (MIRALAX / GLYCOLAX) 17 g packet Take 17 g by mouth. 1 cap 3 days a week   [DISCONTINUED] calcium carbonate (OSCAL) 1500 (600 Ca) MG  TABS tablet Take by mouth daily.   [DISCONTINUED] CALCIUM-MAGNESIUM-VITAMIN D PO Take 1 capsule by mouth 2 (two) times daily. (Patient not taking: Reported on 06/14/2021)   No facility-administered encounter medications on file as of 10/18/2021.    Allergies (verified) Amitiza [lubiprostone], Naprosyn [naproxen], Biaxin [clarithromycin], Ibuprofen, and Clarithromycin   History: Past Medical History:  Diagnosis Date   Abdominal or pelvic swelling, mass, or lump, left lower quadrant    Epistaxis    s/p cauterization   Fibroadenoma    right breast   Hemorrhoids, external, without mention of complication    Infectious diarrhea(009.2)    history of   Other bursitis disorders    Other chest pain    Sleep disturbance, unspecified    Umbilical hernia without mention of obstruction or gangrene    Past Surgical History:  Procedure Laterality Date   BREAST BIOPSY Right 1995   Dr Bary Castilla, neg   BREAST EXCISIONAL BIOPSY Right 1988   fibroadenoma removed,neg   COLONOSCOPY  2001, 2011   Dr Vira Agar   DILATION AND CURETTAGE OF UTERUS  2001   Dr Laurey Morale   HYSTEROSCOPY  1995   normal/ Dr Laurey Morale   Family History  Problem Relation Age of Onset   Breast cancer Mother 37   Cancer Mother        breast, developed uterine cancer while on tamoxifen   Cancer Brother        melanoma/scalp   Social History   Socioeconomic History   Marital status: Married    Spouse  name: Not on file   Number of children: Not on file   Years of education: Not on file   Highest education level: Not on file  Occupational History   Occupation: nurse- full time  Tobacco Use   Smoking status: Never   Smokeless tobacco: Never   Tobacco comments:    Remote history  Substance and Sexual Activity   Alcohol use: Yes    Comment: daily red wine   Drug use: No   Sexual activity: Not on file  Other Topics Concern   Not on file  Social History Narrative   Lives with spouse, daughter.    Has a cat.   Always  uses seat belts.   Heavy exercise.   Gets annual pap smears by gyn, has annual mammograms at [place].      Right handed   Social Determinants of Health   Financial Resource Strain: Low Risk    Difficulty of Paying Living Expenses: Not hard at all  Food Insecurity: No Food Insecurity   Worried About Charity fundraiser in the Last Year: Never true   Ran Out of Food in the Last Year: Never true  Transportation Needs: No Transportation Needs   Lack of Transportation (Medical): No   Lack of Transportation (Non-Medical): No  Physical Activity: Sufficiently Active   Days of Exercise per Week: 4 days   Minutes of Exercise per Session: 60 min  Stress: No Stress Concern Present   Feeling of Stress : Not at all  Social Connections: Unknown   Frequency of Communication with Friends and Family: More than three times a week   Frequency of Social Gatherings with Friends and Family: More than three times a week   Attends Religious Services: Not on Electrical engineer or Organizations: Not on file   Attends Archivist Meetings: Not on file   Marital Status: Married    Tobacco Counseling Counseling given: Not Answered Tobacco comments: Remote history   Clinical Intake:  Pre-visit preparation completed: Yes        Diabetes: No  How often do you need to have someone help you when you read instructions, pamphlets, or other written materials from your doctor or pharmacy?: 1 - Never    Interpreter Needed?: No      Activities of Daily Living In your present state of health, do you have any difficulty performing the following activities: 10/18/2021  Hearing? N  Vision? N  Difficulty concentrating or making decisions? N  Walking or climbing stairs? N  Dressing or bathing? N  Doing errands, shopping? N  Preparing Food and eating ? N  Using the Toilet? N  In the past six months, have you accidently leaked urine? N  Do you have problems with loss of bowel  control? N  Managing your Medications? N  Managing your Finances? N  Housekeeping or managing your Housekeeping? N  Some recent data might be hidden    Patient Care Team: Crecencio Mc, MD as PCP - General (Internal Medicine) Bary Castilla Forest Gleason, MD as Consulting Physician (General Surgery) Requested, Self  Indicate any recent Medical Services you may have received from other than [facility] providers in the past year (date may be approximate).     Assessment:   This is a routine wellness examination for [place].  Virtual Visit via Telephone Note  I connected with  [name] on 10/18/21 at 12:30 PM EST by telephone and verified that I am speaking with  the correct person using two identifiers.  Location: Patient: home Provider: office Persons participating in the virtual visit: patient/Nurse Health Advisor   I discussed the limitations, risks, security and privacy concerns of performing an evaluation and management service by telephone and the availability of in person appointments. The patient expressed understanding and agreed to proceed.  Interactive audio and video telecommunications were attempted between this nurse and patient, however failed, due to patient having technical difficulties OR patient did not have access to video capability.  We continued and completed visit with audio only.  Some vital signs may be absent or patient reported.   Hearing/Vision screen Hearing Screening - Comments:: Patient is able to hear conversational tones without difficulty.  No issues reported. Vision Screening - Comments:: Wears corrective lenses  They have seen their ophthalmologist in the last 12 months.   Dietary issues and exercise activities discussed: Current Exercise Habits: Home exercise routine, Type of exercise: walking;strength training/weights;stretching (Silver sneaker program), Time (Minutes): 60, Frequency (Times/Week): 4, Weekly Exercise (Minutes/Week): 240, Intensity:  Moderate Regular diet Good water intake   Goals Addressed             This Visit's Progress    Maintain healthy lifestyle   On track    Stay active Healthy diet       Depression Screen PHQ 2/9 Scores 10/18/2021 08/22/2021 10/13/2020 10/13/2019 10/08/2018 09/28/2017 09/27/2016  PHQ - 2 Score 0 0 0 0 0 0 0  PHQ- 9 Score - - - - - 0 -    Fall Risk Fall Risk  10/18/2021 08/22/2021 10/20/2020 10/13/2020 04/12/2020  Falls in the past year? 0 0 0 0 0  Number falls in past yr: 0 0 - 0 -  Injury with Fall? - 0 - 0 -  Risk for fall due to : - - - - -  Follow up Falls evaluation completed Falls evaluation completed Falls evaluation completed Falls evaluation completed Falls evaluation completed    [place]: Home free of loose throw rugs in walkways, pet beds, electrical cords, etc? Yes  Adequate lighting in your home to reduce risk of falls? Yes   ASSISTIVE DEVICES UTILIZED TO PREVENT FALLS: Use of a cane, walker or w/c? No  Grab bars in the bathroom? Yes  Comfort height toilet? Yes   TIMED UP AND GO: Was the test performed? No .   Cognitive Function: Patient is alert and oriented x3.  Enjoys Company secretary, reading, board games, word games and other brain health stimulating activities.  MMSE - Mini Mental State Exam 10/08/2018  Orientation to time 5  Orientation to Place 5  Registration 3  Attention/ Calculation 5  Recall 3  Language- name 2 objects 2  Language- repeat 1  Language- follow 3 step command 3  Language- read & follow direction 1  Write a sentence 1  Copy design 1  Total score 30     6CIT Screen 10/13/2019  What Year? 0 points  What month? 0 points  What time? 0 points  Count back from 20 0 points  Months in reverse 0 points  Repeat phrase 0 points  Total Score 0    Immunizations Immunization History  Administered Date(s) Administered   Influenza,inj,Quad PF,6+ Mos 10/20/2020   Influenza-Unspecified 08/27/2014,  09/17/2015, 08/08/2016, 07/31/2017, 08/01/2018, 09/18/2019   Td 08/26/2007   Tdap 10/04/2017   Pneumococcal vaccine status: Due, Education has been provided regarding the importance of this vaccine. Advised may receive this vaccine  at local pharmacy or Health Dept. Aware to provide a copy of the vaccination record if obtained from local pharmacy or Health Dept. Verbalized acceptance and understanding. Deferred.   Shingrix Completed?: No.    Education has been provided regarding the importance of this vaccine. Patient has been advised to call insurance company to determine out of pocket expense if they have not yet received this vaccine. Advised may also receive vaccine at local pharmacy or Health Dept. Verbalized acceptance and understanding.  Screening Tests Health Maintenance  Topic Date Due   INFLUENZA VACCINE  10/20/2021 (Originally 06/20/2021)   Zoster Vaccines- Shingrix (1 of 2) 01/17/2022 (Originally 11/10/2000)   Pneumonia Vaccine 50+ Years old (1 - PCV) 10/18/2022 (Originally 11/11/2015)   COLONOSCOPY (Pts 45-42yrs Insurance coverage will need to be confirmed)  12/04/2021   MAMMOGRAM  10/20/2022   TETANUS/TDAP  10/05/2027   DEXA SCAN  Completed   Hepatitis C Screening  Completed   HPV VACCINES  Aged Out   COVID-19 Vaccine  Discontinued   Health Maintenance There are no preventive care reminders to display for this patient.  Colonoscopy- scheduled 10/26/21. Patient notes she is having soft BM; manageable.   Mammogram- scheduled 10/21/21.  Lung Cancer Screening: (Low Dose CT Chest recommended if Age 54-80 years, 30 pack-year currently smoking OR have quit w/in 15years.) does not qualify.   Vision Screening: Recommended annual ophthalmology exams for early detection of glaucoma and other disorders of the eye.  Dental Screening: Recommended annual dental exams for proper oral hygiene  Community Resource Referral / Chronic Care Management: CRR required this visit?  No   CCM  required this visit?  No      Plan:   Keep all routine maintenance appointments.   I have personally reviewed and noted the following in the patient's chart:   Medical and social history Use of alcohol, tobacco or illicit drugs  Current medications and supplements including opioid prescriptions. Not taking opioid. Added Calcium chocolate chews 500mg  daily.  Functional ability and status Nutritional status Physical activity Advanced directives List of other physicians Hospitalizations, surgeries, and ER visits in previous 12 months Vitals Screenings to include cognitive, depression, and falls Referrals and appointments  In addition, I have reviewed and discussed with patient certain preventive protocols, quality metrics, and best practice recommendations. A written personalized care plan for preventive services as well as general preventive health recommendations were provided to patient.     Varney Biles, LPN   [id number]/08/[id number]

## 2021-10-21 ENCOUNTER — Encounter: Payer: Self-pay | Admitting: Internal Medicine

## 2021-10-21 ENCOUNTER — Other Ambulatory Visit: Payer: Self-pay

## 2021-10-21 ENCOUNTER — Ambulatory Visit (INDEPENDENT_AMBULATORY_CARE_PROVIDER_SITE_OTHER): Payer: Medicare Other | Admitting: Internal Medicine

## 2021-10-21 ENCOUNTER — Ambulatory Visit
Admission: RE | Admit: 2021-10-21 | Discharge: 2021-10-21 | Disposition: A | Discharge status: Home / Self Care | Payer: Medicare Other | Source: Ambulatory Visit | Attending: Internal Medicine | Admitting: Internal Medicine

## 2021-10-21 VITALS — BP 112/66 | HR 84 | Temp 96.4°F | Ht 62.0 in | Wt 109.0 lb

## 2021-10-21 DIAGNOSIS — Z1231 Encounter for screening mammogram for malignant neoplasm of breast: Secondary | ICD-10-CM | POA: Diagnosis present

## 2021-10-21 DIAGNOSIS — R202 Paresthesia of skin: Secondary | ICD-10-CM

## 2021-10-21 DIAGNOSIS — I499 Cardiac arrhythmia, unspecified: Secondary | ICD-10-CM | POA: Diagnosis not present

## 2021-10-21 DIAGNOSIS — K5904 Chronic idiopathic constipation: Secondary | ICD-10-CM

## 2021-10-21 DIAGNOSIS — R200 Anesthesia of skin: Secondary | ICD-10-CM

## 2021-10-21 DIAGNOSIS — M810 Age-related osteoporosis without current pathological fracture: Secondary | ICD-10-CM | POA: Diagnosis not present

## 2021-10-21 DIAGNOSIS — K5909 Other constipation: Secondary | ICD-10-CM

## 2021-10-21 DIAGNOSIS — N952 Postmenopausal atrophic vaginitis: Secondary | ICD-10-CM

## 2021-10-21 IMAGING — MG MM DIGITAL SCREENING BILAT W/ TOMO AND CAD
8 series · 8 of 24 positions shown · non-contrast
Comparison: Previous exam(s).

CLINICAL DATA: Screening.

EXAM:
DIGITAL SCREENING BILATERAL MAMMOGRAM WITH TOMOSYNTHESIS AND CAD
TECHNIQUE: Bilateral screening digital craniocaudal and mediolateral oblique
mammograms were obtained. Bilateral screening digital breast
tomosynthesis was performed. The images were evaluated with
computer-aided detection.

[R MLO synth-2D]
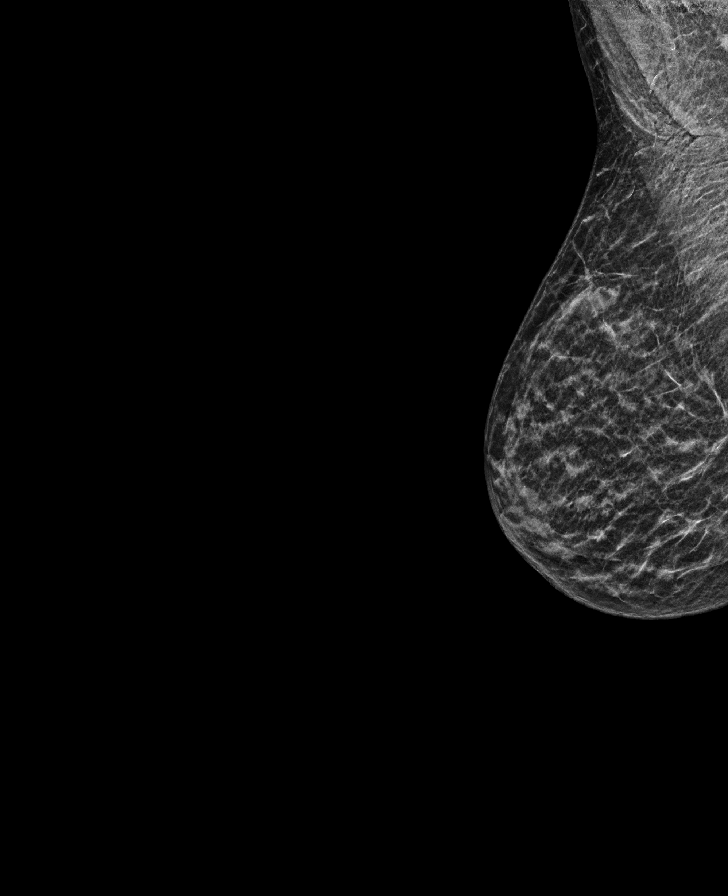

[L CC synth-2D]
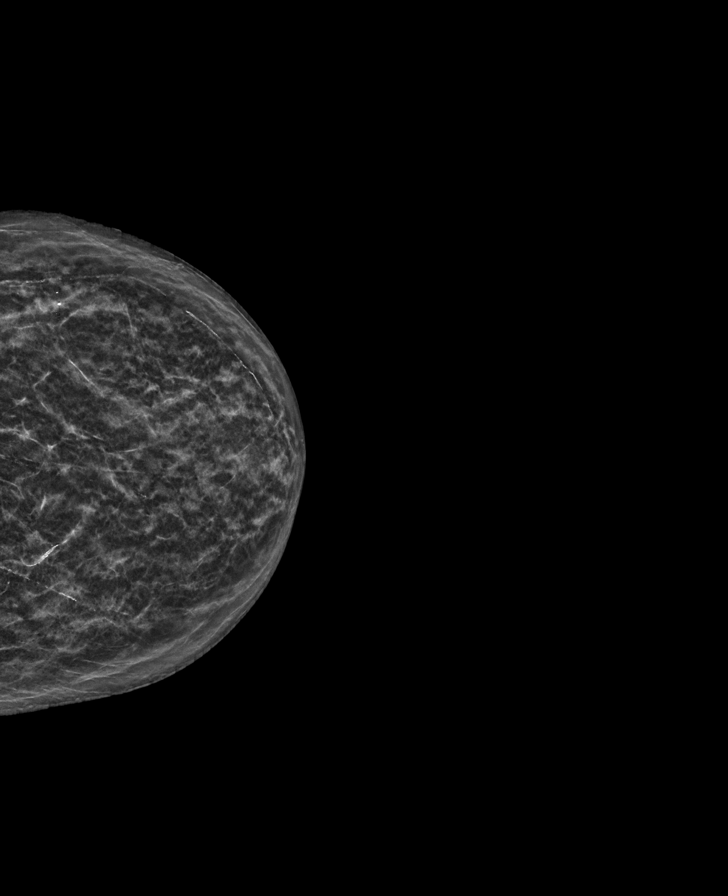

[R CC synth-2D]
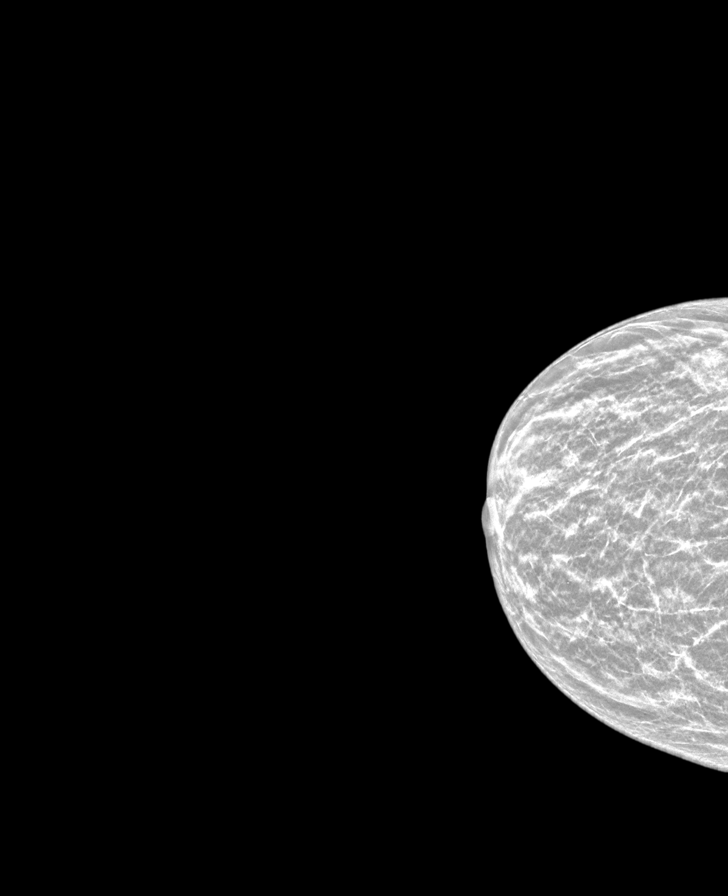

[L MLO synth-2D]
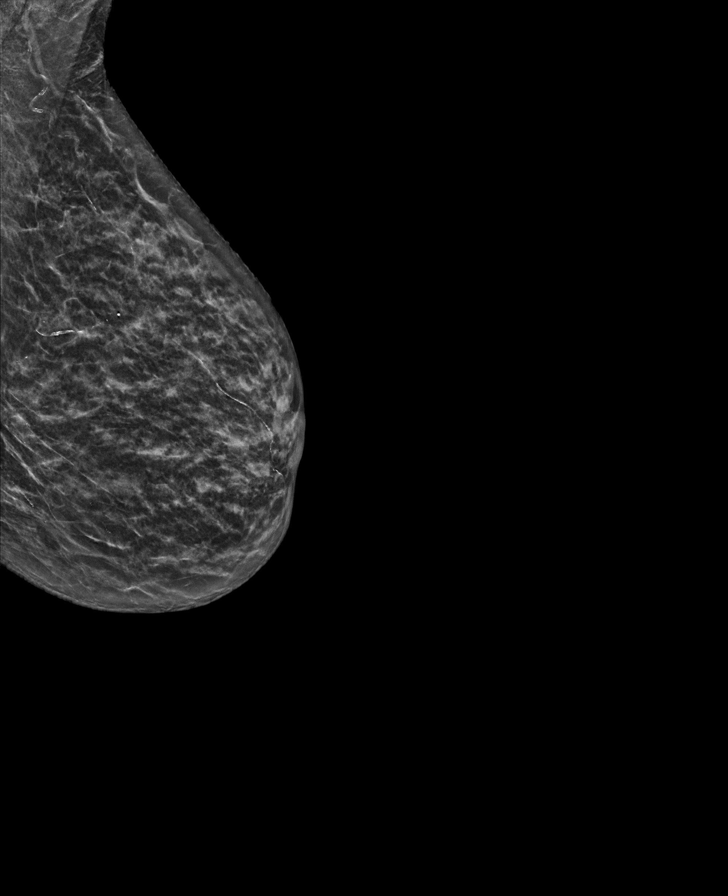

[R MLO tomo · tomo slice 16/31.0]
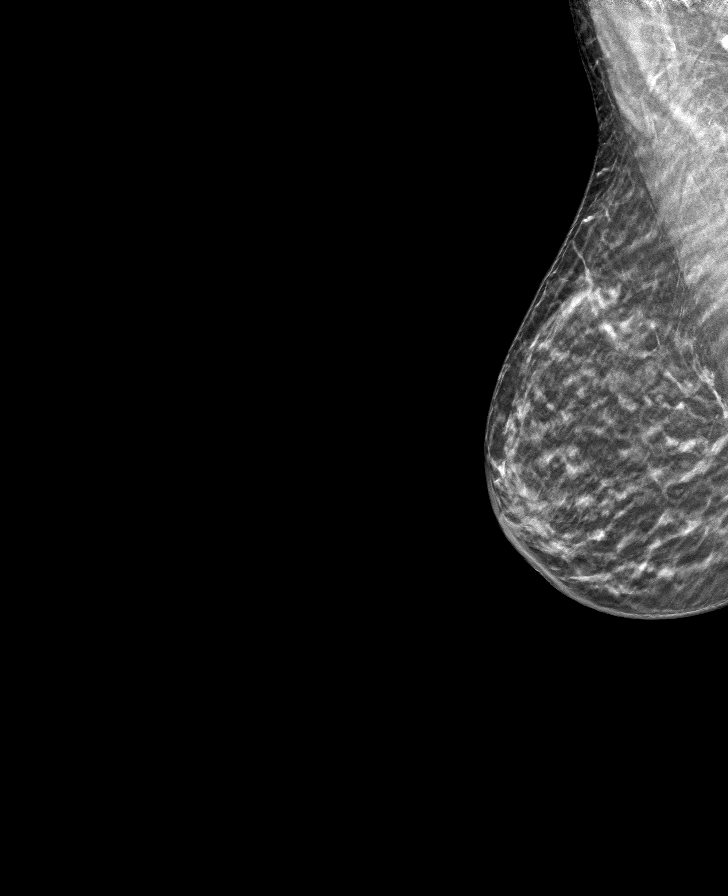

[R CC tomo · tomo slice 18/35.0]
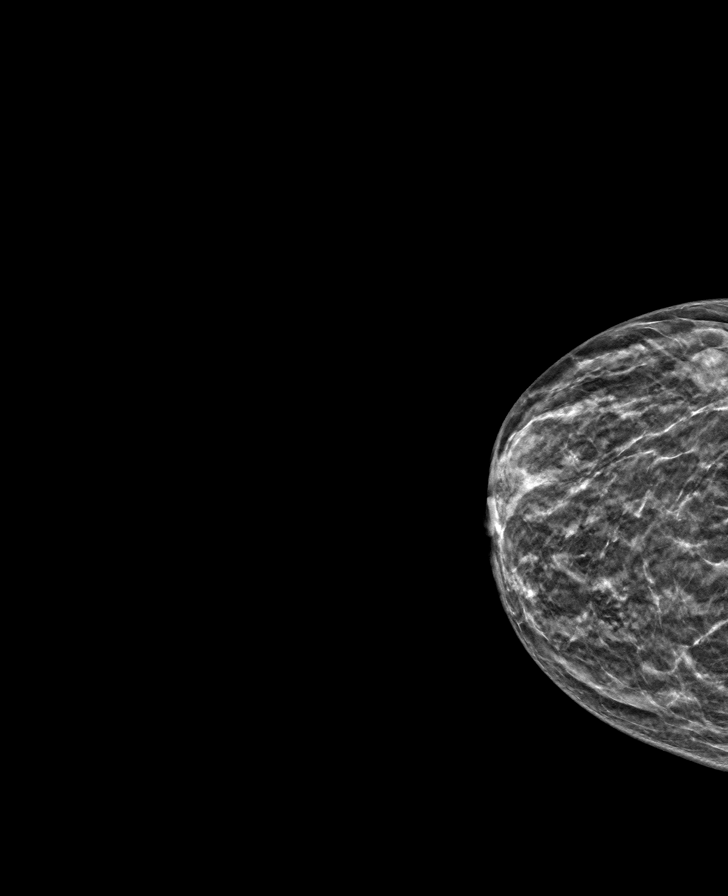

[L MLO tomo · tomo slice 23/46.0]
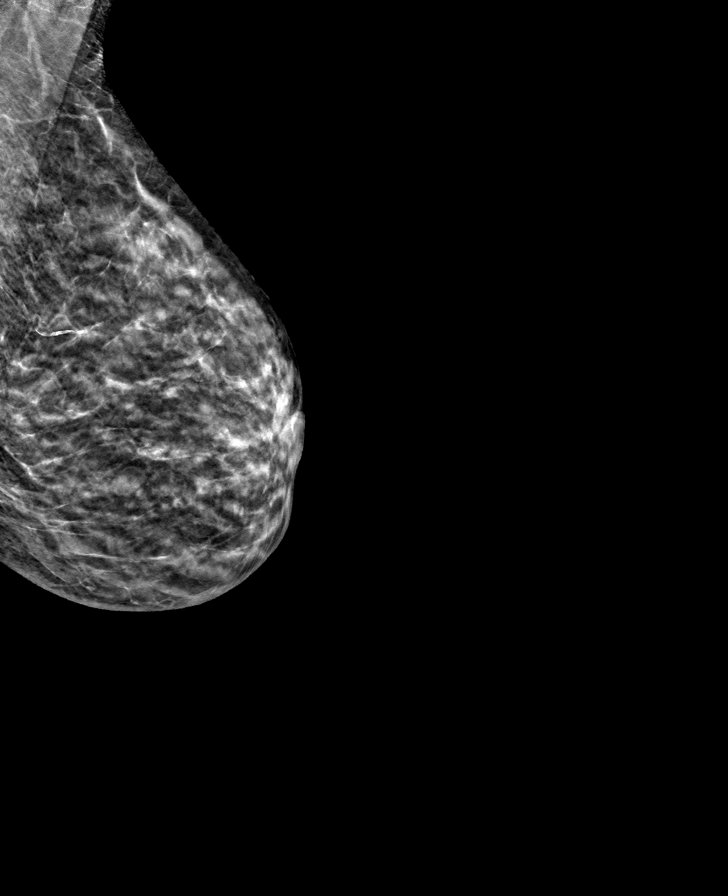

[L CC tomo · tomo slice 23/45.0]
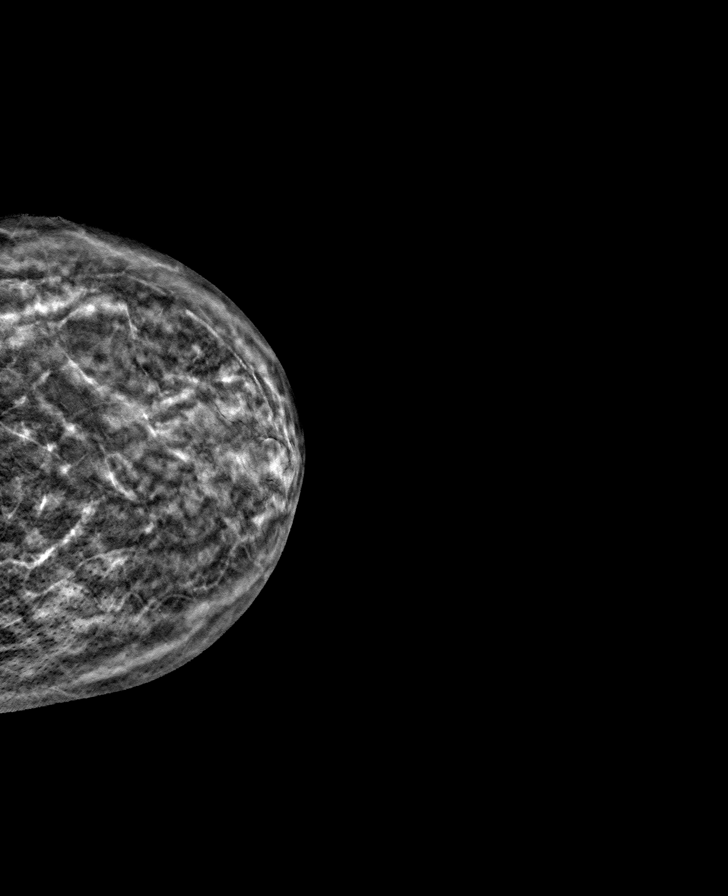

[8 of 24 positions shown; findings below may reference images not displayed]

ACR Breast Density Category c: The breast tissue is heterogeneously
dense, which may obscure small masses.
FINDINGS: There are no findings suspicious for malignancy.
IMPRESSION: No mammographic evidence of malignancy. A result letter of this
screening mammogram will be mailed directly to the patient.

RECOMMENDATION:
Screening mammogram in one year. (Code:[V2])

BI-RADS CATEGORY  1: Negative.

## 2021-10-21 MED ORDER — ZOSTER VAC RECOMB ADJUVANTED 50 MCG/0.5ML IM SUSR: 0.5000 mL | Freq: Once | INTRAMUSCULAR | 1 refills | Status: AC

## 2021-10-21 NOTE — Progress Notes (Signed)
Patient ID: Erica Maldonado, female    DOB: 03/06/1951  Age: 70 y.o. MRN: [id number]  The patient is here for annual follow up and management of other chronic and acute problems.  This visit occurred during the SARS-CoV-2 public health emergency.  Safety protocols were in place, including screening questions prior to the visit, additional usage of staff PPE, and extensive cleaning of exam room while observing appropriate contact time as indicated for disinfecting solutions.     The risk factors are reflected in the social history.  The roster of all physicians providing medical care to patient - is listed in the Snapshot section of the chart.  Activities of daily living:  The patient is 100% independent in all ADLs: dressing, toileting, feeding as well as independent mobility  Home safety : The patient has smoke detectors in the home. They wear seatbelts.  There are no firearms at home. There is no violence in the home.   There is no risks for hepatitis, STDs or HIV. There is no   history of blood transfusion. They have no travel history to infectious disease endemic areas of the world.  The patient has seen their dentist in the last six month. They have seen their eye doctor in the last year. She denies hearing difficulty with regard to whispered voices and some television programs.  They have deferred audiologic testing in the last year.  They do not  have excessive sun exposure. Discussed the need for sun protection: hats, long sleeves and use of sunscreen if there is significant sun exposure.   Diet: the importance of a healthy diet is discussed. They do have a healthy diet.  The benefits of regular aerobic exercise were discussed. She walks 4 times per week ,  60 minutes.   Depression screen: there are no signs or vegative symptoms of depression- irritability, change in appetite, anhedonia, sadness/tearfullness.  Cognitive assessment: the patient manages all their financial and personal  affairs and is actively engaged. They could relate day,date,year and events; recalled 2/3 objects at 3 minutes; performed clock-face test normally.  The following portions of the patient's history were reviewed and updated as appropriate: allergies, current medications, past family history, past medical history,  past surgical history, past social history  and problem list.  Visual acuity was not assessed per patient preference since she has regular follow up with her ophthalmologist. Hearing and body mass index were assessed and reviewed.   During the course of the visit the patient was educated and counseled about appropriate screening and preventive services including : fall prevention , diabetes screening, nutrition counseling, colorectal cancer screening, and recommended immunizations.    CC: The primary encounter diagnosis was Irregular heart beat. Diagnoses of Encounter for screening mammogram for malignant neoplasm of breast, Chronic constipation, Age-related osteoporosis without current pathological fracture, Functional constipation, Atrophic vaginitis, and Numbness and tingling of both legs below knees were also pertinent to this visit.    History Basya has a past medical history of Abdominal or pelvic swelling, mass, or lump, left lower quadrant, Epistaxis, Fibroadenoma, Hemorrhoids, external, without mention of complication, Infectious diarrhea(009.2), Other bursitis disorders, Other chest pain, Sleep disturbance, unspecified, and Umbilical hernia without mention of obstruction or gangrene.   She has a past surgical history that includes Hysteroscopy (1995); Dilation and curettage of uterus (2001); Colonoscopy (2001, 2011); Breast excisional biopsy (Right, 1988); and Breast biopsy (Right, 1995).   Her family history includes Breast cancer (age of onset: 61) in her mother; Cancer in her  brother and mother.She reports that she has never smoked. She has never used smokeless tobacco. She  reports current alcohol use. She reports that she does not use drugs.  Outpatient Medications Prior to Visit  Medication Sig Dispense Refill   Cholecalciferol (VITAMIN D3 PO) Take 2,000 Units by mouth daily.     docusate sodium (COLACE) 100 MG capsule Take 100 mg by mouth 2 (two) times daily.     estradiol (ESTRACE VAGINAL) 0.1 MG/GM vaginal cream Place 1 Applicatorful vaginally 3 (three) times a week. 42.5 g 12   glycerin adult 2 g suppository Place rectally.     Nutritional Supplements (NUTRITIONAL SUPPLEMENT PO) Take 1 Dose by mouth daily as needed.     polyethylene glycol (MIRALAX / GLYCOLAX) 17 g packet Take 17 g by mouth. 1 cap 3 days a week     polyethylene glycol-electrolytes (NULYTELY) 420 g solution Take 4,000 mLs by mouth as directed. For colonoscopy on 10/26/2021     Magnesium Ascorbate POWD 125 mg daily by Does not apply route. (Patient not taking: Reported on 10/21/2021)     No facility-administered medications prior to visit.    Review of Systems  Patient denies headache, fevers, malaise, unintentional weight loss, skin rash, eye pain, sinus congestion and sinus pain, sore throat, dysphagia,  hemoptysis , cough, dyspnea, wheezing, chest pain, palpitations, orthopnea, edema, abdominal pain, nausea, melena, diarrhea, constipation, flank pain, dysuria, hematuria, urinary  Frequency, nocturia, numbness, tingling, seizures,  Focal weakness, Loss of consciousness,  Tremor, insomnia, depression, anxiety, and suicidal ideation.    Objective:  BP 112/66 (BP Location: Left Arm, Patient Position: Sitting, Cuff Size: Normal)   Pulse 84   Temp (!) 96.4 F (35.8 C) (Temporal)   Ht 5\' 2"  (1.575 m)   Wt 109 lb (49.4 kg)   SpO2 98%   BMI 19.94 kg/m   Physical Exam .  General appearance: alert, cooperative and appears stated age Head: Normocephalic, without obvious abnormality, atraumatic Eyes: conjunctivae/corneas clear. PERRL, EOM's intact. Fundi benign. Ears: normal TM's and  external ear canals both ears Nose: Nares normal. Septum midline. Mucosa normal. No drainage or sinus tenderness. Throat: lips, mucosa, and tongue normal; teeth and gums normal Neck: no adenopathy, no carotid bruit, no JVD, supple, symmetrical, trachea midline and thyroid not enlarged, symmetric, no tenderness/mass/nodules Lungs: clear to auscultation bilaterally Breasts: normal appearance, no masses or tenderness Heart: irreg irreg , S1, S2 normal, no murmur, click, rub or gallop Abdomen: soft, non-tender; bowel sounds normal; no masses,  no organomegaly Extremities: extremities normal, atraumatic, no cyanosis or edema Pulses: 2+ and symmetric Skin: Skin color, texture, turgor normal. No rashes or lesions Neurologic: Alert and oriented X 3, normal strength and tone. Normal symmetric reflexes. Normal coordination and gait.     Assessment & Plan:   Problem List Items Addressed This Visit     Atrophic vaginitis    Presented with postcoital spotting.  Continue estrace cream      Breast cancer screening    Mammogram ordered and breast exam done      Chronic constipation    Using  Stool softeners plus clearlax.  colonoscopy is scheduled for Wednesday by byrnett       Relevant Medications   glycerin adult 2 g suppository   polyethylene glycol-electrolytes (NULYTELY) 420 g solution   Functional constipation    Reviewed regimen.   For colonoscopy next week       Irregular heart beat - Primary    I have ordered and  reviewed a 12 lead EKG and find that there are no acute changes and patient is in sinus rhythm.        Relevant Orders   EKG 12-Lead   Numbness and tingling of both legs below knees    Still present,  Not any worse.  Pins and needles. Intermittent.  No further workup desired at present       Osteoporosis    Underweight contributing.  Reviewed protein intake ,  Advised her on her  calcium  Needs. . Repeat 2 years        I provided  30 minutes  during this  encounter reviewing patient's current problems and past surgeries, labs and imaging studies, providing counseling on the above mentioned problems I na face to face visit  , and coordination  of care .  Meds ordered this encounter  Medications   Zoster Vaccine Adjuvanted [facility]) injection    Sig: Inject 0.5 mLs into the muscle once for 1 dose.    Dispense:  1 each    Refill:  1    There are no discontinued medications.  Follow-up: No follow-ups on file.   Crecencio Mc, MD

## 2021-10-21 NOTE — Assessment & Plan Note (Signed)
Using  Stool softeners plus clearlax.  colonoscopy is scheduled for Wednesday by byrnett

## 2021-10-21 NOTE — Assessment & Plan Note (Signed)
Mammogram ordered and breast exam done

## 2021-10-21 NOTE — Patient Instructions (Addendum)
Good to see you !  I recommend that after your colonoscopy you get the Prevnar 20 pneumonia vaccine  AND THE FLU VACCINE!   The Tdap and Shingrix vaccines will be COVERED BY MEDICARE if you get them at your pharmacy starting November 20 2021   Tdap is not due until 2014

## 2021-10-21 NOTE — Assessment & Plan Note (Signed)
I have ordered and reviewed a 12 lead EKG and find that there are no acute changes and patient is in sinus rhythm.

## 2021-10-22 NOTE — Assessment & Plan Note (Signed)
Underweight contributing.  Reviewed protein intake ,  Advised her on her  calcium  Needs. . Repeat 2 years

## 2021-10-22 NOTE — Assessment & Plan Note (Signed)
Still present,  Not any worse.  Pins and needles. Intermittent.  No further workup desired at present

## 2021-10-22 NOTE — Assessment & Plan Note (Signed)
Presented with postcoital spotting.  Continue estrace cream

## 2021-10-22 NOTE — Assessment & Plan Note (Signed)
Reviewed regimen.   For colonoscopy next week

## 2021-10-25 ENCOUNTER — Encounter: Payer: Self-pay | Admitting: General Surgery

## 2021-10-26 ENCOUNTER — Ambulatory Visit
Admission: RE | Admit: 2021-10-26 | Discharge: 2021-10-26 | Disposition: A | Discharge status: Home / Self Care | Payer: Medicare Other | Attending: General Surgery | Admitting: General Surgery

## 2021-10-26 ENCOUNTER — Ambulatory Visit: Payer: Medicare Other | Admitting: Anesthesiology

## 2021-10-26 ENCOUNTER — Encounter
Admission: RE | Disposition: A | Discharge status: Home / Self Care | Payer: Self-pay | Source: Home / Self Care | Attending: General Surgery

## 2021-10-26 ENCOUNTER — Encounter: Payer: Self-pay | Admitting: General Surgery

## 2021-10-26 DIAGNOSIS — R194 Change in bowel habit: Secondary | ICD-10-CM | POA: Diagnosis present

## 2021-10-26 HISTORY — PX: COLONOSCOPY WITH PROPOFOL: SHX5780

## 2021-10-26 LAB — HM COLONOSCOPY

## 2021-10-26 SURGERY — COLONOSCOPY WITH PROPOFOL
Anesthesia: General

## 2021-10-26 MED ORDER — PROPOFOL 500 MG/50ML IV EMUL
INTRAVENOUS | Status: DC | PRN
  Administered 2021-10-26: 120 ug/kg/min via INTRAVENOUS

## 2021-10-26 MED ORDER — SODIUM CHLORIDE 0.9 % IV SOLN
INTRAVENOUS | Status: DC
  Administered 2021-10-26: 1000 mL via INTRAVENOUS

## 2021-10-26 MED ORDER — EPHEDRINE SULFATE 50 MG/ML IJ SOLN
INTRAMUSCULAR | Status: DC | PRN
  Administered 2021-10-26: 5 mg via INTRAVENOUS

## 2021-10-26 MED ORDER — LIDOCAINE HCL (PF) 1 % IJ SOLN
INTRAMUSCULAR | Status: AC
  Filled 2021-10-26: qty 2

## 2021-10-26 MED ORDER — LIDOCAINE 2% (20 MG/ML) 5 ML SYRINGE
INTRAMUSCULAR | Status: DC | PRN
  Administered 2021-10-26: 20 mg via INTRAVENOUS

## 2021-10-26 MED ORDER — PROPOFOL 10 MG/ML IV BOLUS
INTRAVENOUS | Status: DC | PRN
  Administered 2021-10-26 (×2): 20 mg via INTRAVENOUS
  Administered 2021-10-26: 50 mg via INTRAVENOUS

## 2021-10-26 NOTE — H&P (Signed)
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 Normal exam in 2013. For colonoscopy.   Medications Prior to Admission  Medication Sig Dispense Refill Last Dose   Omega-3 Fatty Acids (FISH OIL PO) Take by mouth.      Cholecalciferol (VITAMIN D3 PO) Take 2,000 Units by mouth daily.      docusate sodium (COLACE) 100 MG capsule Take 100 mg by mouth 2 (two) times daily.      estradiol (ESTRACE VAGINAL) 0.1 MG/GM vaginal cream Place 1 Applicatorful vaginally 3 (three) times a week. 42.5 g 12    glycerin adult 2 g suppository Place rectally.      Magnesium Ascorbate POWD 125 mg daily by Does not apply route. (Patient not taking: Reported on 10/21/2021)      Nutritional Supplements (NUTRITIONAL SUPPLEMENT PO) Take 1 Dose by mouth daily as needed.      polyethylene glycol (MIRALAX / GLYCOLAX) 17 g packet Take 17 g by mouth. 1 cap 3 days a week      polyethylene glycol-electrolytes (NULYTELY) 420 g solution Take 4,000 mLs by mouth as directed. For colonoscopy on 10/26/2021      Allergies  Allergen Reactions   Amitiza [Lubiprostone] Shortness Of Breath   Naprosyn [Naproxen] Swelling    Throat felt like it was closing   Biaxin [Clarithromycin] Other (See Comments)    Tongue blisters   Ibuprofen Other (See Comments)   Clarithromycin Rash    Blisters on tongue   Past Medical History:  Diagnosis Date   Abdominal or pelvic swelling, mass, or lump, left lower quadrant    Epistaxis    s/p cauterization   Fibroadenoma    right breast   Hemorrhoids, external, without mention of complication    Infectious diarrhea(009.2)    history of   Other bursitis disorders    Other chest pain    Sleep disturbance, unspecified    Umbilical hernia without mention of obstruction or gangrene    Past Surgical History:  Procedure Laterality Date   BREAST BIOPSY Right 1995   Dr Bary Castilla, neg   BREAST EXCISIONAL BIOPSY Right 1988   fibroadenoma  removed,neg   COLONOSCOPY  2001, 2011   Dr Vira Agar   DILATION AND CURETTAGE OF UTERUS  2001   Dr Laurey Morale   HYSTEROSCOPY  1995   normal/ Dr Laurey Morale   HYSTEROSCOPY     Social History   Socioeconomic History   Marital status: Married    Spouse name: Not on file   Number of children: Not on file   Years of education: Not on file   Highest education level: Not on file  Occupational History   Occupation: nurse- full time  Tobacco Use   Smoking status: Never   Smokeless tobacco: Never   Tobacco comments:    Remote history  Substance and Sexual Activity   Alcohol use: Yes    Comment: daily red wine   Drug use: No   Sexual activity: Not on file  Other Topics Concern   Not on file  Social History Narrative   Lives with spouse, daughter.    Has a cat.   Always uses seat belts.   Heavy exercise.   Gets annual pap smears by gyn, has annual mammograms at [place].      Right handed   Social Determinants of Health   Financial Resource Strain: Low Risk    Difficulty of Paying Living Expenses: Not  hard at all  Food Insecurity: No Food Insecurity   Worried About Charity fundraiser in the Last Year: Never true   Ran Out of Food in the Last Year: Never true  Transportation Needs: No Transportation Needs   Lack of Transportation (Medical): No   Lack of Transportation (Non-Medical): No  Physical Activity: Sufficiently Active   Days of Exercise per Week: 4 days   Minutes of Exercise per Session: 60 min  Stress: No Stress Concern Present   Feeling of Stress : Not at all  Social Connections: Unknown   Frequency of Communication with Friends and Family: More than three times a week   Frequency of Social Gatherings with Friends and Family: More than three times a week   Attends Religious Services: Not on Electrical engineer or Organizations: Not on file   Attends Archivist Meetings: Not on file   Marital Status: Married  Human resources officer Violence: Not At Risk    Fear of Current or Ex-Partner: No   Emotionally Abused: No   Physically Abused: No   Sexually Abused: No   Social History   Social History Narrative   Lives with spouse, daughter.    Has a cat.   Always uses seat belts.   Heavy exercise.   Gets annual pap smears by gyn, has annual mammograms at [place].      Right handed     ROS: Negative.     PE: HEENT: Negative. Lungs: Clear. Cardio: RR.   Assessment/Plan:  Proceed with planned endoscopy.  Erica Maldonado 10/26/2021

## 2021-10-26 NOTE — Anesthesia Preprocedure Evaluation (Signed)
Anesthesia Evaluation  Patient identified by MRN, date of birth, ID band Patient awake    Reviewed: Allergy & Precautions, NPO status , Patient's Chart, lab work & pertinent test results  History of Anesthesia Complications (+) PROLONGED EMERGENCE, Emergence Delirium and history of anesthetic complications  Airway Mallampati: III  TM Distance: >3 FB Neck ROM: full    Dental  (+) Chipped   Pulmonary neg shortness of breath, sleep apnea ,    Pulmonary exam normal        Cardiovascular Exercise Tolerance: Good (-) angina(-) Past MI and (-) DOE negative cardio ROS Normal cardiovascular exam     Neuro/Psych negative neurological ROS  negative psych ROS   GI/Hepatic negative GI ROS, Neg liver ROS, neg GERD  ,  Endo/Other  negative endocrine ROS  Renal/GU negative Renal ROS  negative genitourinary   Musculoskeletal   Abdominal   Peds  Hematology negative hematology ROS (+)   Anesthesia Other Findings Past Medical History: No date: Abdominal or pelvic swelling, mass, or lump, left lower  quadrant No date: Epistaxis     Comment:  s/p cauterization No date: Fibroadenoma     Comment:  right breast No date: Hemorrhoids, external, without mention of complication No date: Infectious diarrhea(009.2)     Comment:  history of No date: Other bursitis disorders No date: Other chest pain No date: Sleep disturbance, unspecified No date: Umbilical hernia without mention of obstruction or gangrene  Past Surgical History: 1995: BREAST BIOPSY; Right     Comment:  Dr Bary Castilla, neg 1988: BREAST EXCISIONAL BIOPSY; Right     Comment:  fibroadenoma removed,neg 2001, 2011: COLONOSCOPY     Comment:  Dr Vira Agar 2001: DILATION AND CURETTAGE OF UTERUS     Comment:  Dr Laurey Morale 1995: HYSTEROSCOPY     Comment:  normal/ Dr Laurey Morale No date: HYSTEROSCOPY  BMI    Body Mass Index: 19.94 kg/m      Reproductive/Obstetrics negative OB  ROS                             Anesthesia Physical Anesthesia Plan  ASA: 2  Anesthesia Plan: General   Post-op Pain Management:    Induction: Intravenous  PONV Risk Score and Plan: Propofol infusion and TIVA  Airway Management Planned: Natural Airway and Nasal Cannula  Additional Equipment:   Intra-op Plan:   Post-operative Plan:   Informed Consent: I have reviewed the patients History and Physical, chart, labs and discussed the procedure including the risks, benefits and alternatives for the proposed anesthesia with the patient or authorized representative who has indicated his/her understanding and acceptance.     Dental Advisory Given  Plan Discussed with: Anesthesiologist, CRNA and Surgeon  Anesthesia Plan Comments: (Patient consented for risks of anesthesia including but not limited to:  - adverse reactions to medications - risk of airway placement if required - damage to eyes, teeth, lips or other oral mucosa - nerve damage due to positioning  - sore throat or hoarseness - Damage to heart, brain, nerves, lungs, other parts of body or loss of life  Patient voiced understanding.)        Anesthesia Quick Evaluation

## 2021-10-26 NOTE — Op Note (Signed)
[facility] Patient Name: Erica Maldonado Procedure Date: 10/26/2021 10:45 AM MRN: [id number] Account #: [id number] Date of Birth: Nov 28, 1950 Admit Type: Outpatient Age: 70 Room: [facility] ENDO ROOM 1 Gender: Female Note Status: Finalized Instrument Name: Peds Colonoscope [id number] Procedure:             Colonoscopy Indications:           Change in bowel habits Providers:             Robert Bellow, MD Medicines:             Propofol per Anesthesia Complications:         No immediate complications. Procedure:             Pre-Anesthesia Assessment:                        - Prior to the procedure, a History and Physical was                         performed, and patient medications, allergies and                         sensitivities were reviewed. The patient's tolerance                         of previous anesthesia was reviewed.                        - The risks and benefits of the procedure and the                         sedation options and risks were discussed with the                         patient. All questions were answered and informed                         consent was obtained.                        After obtaining informed consent, the colonoscope was                         passed under direct vision. Throughout the procedure,                         the patient's blood pressure, pulse, and oxygen                         saturations were monitored continuously. The                         Colonoscope was introduced through the anus and                         advanced to the the cecum, identified by appendiceal                         orifice and ileocecal valve. The colonoscopy was  somewhat difficult due to a tortuous colon. Successful                         completion of the procedure was aided by using manual                         pressure. The patient tolerated the procedure well.                          The quality of the bowel preparation was good. Findings:      The entire examined colon appeared normal on direct and retroflexion       views. Impression:            - The entire examined colon is normal on direct and                         retroflexion views.                        - No specimens collected. Recommendation:        - Discharge patient to home (via wheelchair).                        - Resume previous diet indefinitely. Procedure Code(s):     --- Professional ---                        [phone], Colonoscopy, flexible; diagnostic, including                         collection of specimen(s) by brushing or washing, when                         performed (separate procedure) Diagnosis Code(s):     --- Professional ---                        R19.4, Change in bowel habit CPT copyright 2019 American Medical Association. All rights reserved. The codes documented in this report are preliminary and upon coder review may  be revised to meet current compliance requirements. Robert Bellow, MD 10/26/2021 11:28:52 AM This report has been signed electronically. Number of Addenda: 0 Note Initiated On: 10/26/2021 10:45 AM Scope Withdrawal Time: 0 hours 17 minutes 26 seconds  Total Procedure Duration: 0 hours 26 minutes 34 seconds  Estimated Blood Loss:  Estimated blood loss: none.      [facility]

## 2021-10-26 NOTE — Transfer of Care (Signed)
Immediate Anesthesia Transfer of Care Note  Patient: Erica Maldonado  Procedure(s) Performed: COLONOSCOPY WITH PROPOFOL  Patient Location: Endoscopy Unit  Anesthesia Type:General  Level of Consciousness: awake and alert   Airway & Oxygen Therapy: Patient Spontanous Breathing  Post-op Assessment: Report given to RN and Post -op Vital signs reviewed and stable  Post vital signs: Reviewed  Last Vitals:  Vitals Value Taken Time  BP    Temp    Pulse    Resp    SpO2      Last Pain:  Vitals:   10/26/21 1015  TempSrc: Tympanic  PainSc: 0-No pain         Complications: No notable events documented.

## 2021-10-27 ENCOUNTER — Encounter: Payer: Self-pay | Admitting: General Surgery

## 2021-10-27 NOTE — Anesthesia Postprocedure Evaluation (Signed)
Anesthesia Post Note  Patient: Charlotte Fidalgo  Procedure(s) Performed: COLONOSCOPY WITH PROPOFOL  Patient location during evaluation: Endoscopy Anesthesia Type: General Level of consciousness: awake and alert Pain management: pain level controlled Vital Signs Assessment: post-procedure vital signs reviewed and stable Respiratory status: spontaneous breathing, nonlabored ventilation, respiratory function stable and patient connected to nasal cannula oxygen Cardiovascular status: blood pressure returned to baseline and stable Postop Assessment: no apparent nausea or vomiting Anesthetic complications: no   No notable events documented.   Last Vitals:  Vitals:   10/26/21 1133 10/26/21 1155  BP: 115/70 139/80  Pulse:    Resp:    Temp:    SpO2:      Last Pain:  Vitals:   10/27/21 0755  TempSrc:   PainSc: 0-No pain                 Precious Haws Countess Biebel

## 2021-10-28 ENCOUNTER — Encounter: Payer: Self-pay | Admitting: Internal Medicine

## 2021-11-11 ENCOUNTER — Encounter: Payer: Self-pay | Admitting: Internal Medicine

## 2021-11-26 ENCOUNTER — Encounter: Payer: Self-pay | Admitting: Internal Medicine

## 2021-11-28 ENCOUNTER — Other Ambulatory Visit: Payer: Self-pay

## 2021-11-28 MED ORDER — ESTRADIOL 0.1 MG/GM VA CREA: 1.0000 | TOPICAL_CREAM | VAGINAL | 12 refills | Status: DC

## 2021-12-14 ENCOUNTER — Other Ambulatory Visit: Payer: Self-pay | Admitting: Internal Medicine

## 2021-12-14 MED ORDER — ESTRADIOL 0.1 MG/GM VA CREA: 1.0000 g | TOPICAL_CREAM | VAGINAL | 12 refills | Status: AC

## 2022-10-18 ENCOUNTER — Telehealth: Payer: Self-pay | Admitting: Internal Medicine

## 2022-10-18 NOTE — Telephone Encounter (Signed)
Spoke with patient and she moved to [place] and seeing new pcp Lowella Dell DO, with [facility] in [place]

## 2022-11-01 NOTE — Telephone Encounter (Signed)
MyChart messgae sent to patient.s

## 2024-10-07 NOTE — Telephone Encounter (Signed)
 open in error
